# Patient Record
Sex: Female | Born: 1935
Health system: Southern US, Community
[De-identification: ages and names within clinical notes are randomized; demographics above are authoritative.]

## PROBLEM LIST (undated history)

## (undated) DIAGNOSIS — E7800 Pure hypercholesterolemia, unspecified: Secondary | ICD-10-CM

## (undated) DIAGNOSIS — K5790 Diverticulosis of intestine, part unspecified, without perforation or abscess without bleeding: Secondary | ICD-10-CM

## (undated) DIAGNOSIS — I10 Essential (primary) hypertension: Secondary | ICD-10-CM

## (undated) HISTORY — DX: Diverticulosis of intestine, part unspecified, without perforation or abscess without bleeding: K57.90

---
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
## 1984-10-21 HISTORY — PX: ABDOMINAL HYSTERECTOMY: SHX81

## 1998-06-14 ENCOUNTER — Ambulatory Visit (HOSPITAL_COMMUNITY): Admission: RE | Admit: 1998-06-14 | Discharge: 1998-06-14 | Payer: Self-pay | Admitting: Emergency Medicine

## 1999-06-21 ENCOUNTER — Ambulatory Visit (HOSPITAL_COMMUNITY): Admission: RE | Admit: 1999-06-21 | Discharge: 1999-06-21 | Payer: Self-pay | Admitting: *Deleted

## 2000-06-24 ENCOUNTER — Ambulatory Visit (HOSPITAL_COMMUNITY): Admission: RE | Admit: 2000-06-24 | Discharge: 2000-06-24 | Payer: Self-pay

## 2000-07-06 ENCOUNTER — Emergency Department (HOSPITAL_COMMUNITY): Admission: EM | Admit: 2000-07-06 | Discharge: 2000-07-06 | Payer: Self-pay | Admitting: Emergency Medicine

## 2001-06-26 ENCOUNTER — Encounter: Payer: Self-pay | Admitting: Family Medicine

## 2001-06-26 ENCOUNTER — Ambulatory Visit (HOSPITAL_COMMUNITY): Admission: RE | Admit: 2001-06-26 | Discharge: 2001-06-26 | Payer: Self-pay

## 2002-01-12 ENCOUNTER — Other Ambulatory Visit: Admission: RE | Admit: 2002-01-12 | Discharge: 2002-01-12 | Payer: Self-pay | Admitting: Nurse Practitioner

## 2002-01-29 ENCOUNTER — Encounter: Admission: RE | Admit: 2002-01-29 | Discharge: 2002-01-29 | Payer: Self-pay | Admitting: Family Medicine

## 2002-01-29 ENCOUNTER — Encounter: Payer: Self-pay | Admitting: Family Medicine

## 2002-02-06 ENCOUNTER — Encounter: Payer: Self-pay | Admitting: Family Medicine

## 2002-02-06 ENCOUNTER — Ambulatory Visit (HOSPITAL_COMMUNITY): Admission: RE | Admit: 2002-02-06 | Discharge: 2002-02-06 | Payer: Self-pay | Admitting: Family Medicine

## 2002-06-28 ENCOUNTER — Ambulatory Visit (HOSPITAL_COMMUNITY): Admission: RE | Admit: 2002-06-28 | Discharge: 2002-06-28 | Payer: Self-pay

## 2003-07-04 ENCOUNTER — Ambulatory Visit (HOSPITAL_COMMUNITY): Admission: RE | Admit: 2003-07-04 | Discharge: 2003-07-04 | Payer: Self-pay | Admitting: *Deleted

## 2005-01-07 ENCOUNTER — Emergency Department (HOSPITAL_COMMUNITY): Admission: EM | Admit: 2005-01-07 | Discharge: 2005-01-07 | Payer: Self-pay | Admitting: Emergency Medicine

## 2007-02-27 ENCOUNTER — Ambulatory Visit: Payer: Self-pay | Admitting: Gastroenterology

## 2007-03-13 ENCOUNTER — Encounter: Payer: Self-pay | Admitting: Gastroenterology

## 2007-03-13 ENCOUNTER — Ambulatory Visit: Payer: Self-pay | Admitting: Gastroenterology

## 2007-12-30 ENCOUNTER — Emergency Department (HOSPITAL_COMMUNITY): Admission: EM | Admit: 2007-12-30 | Discharge: 2007-12-30 | Payer: Self-pay | Admitting: Family Medicine

## 2009-03-02 ENCOUNTER — Encounter: Admission: RE | Admit: 2009-03-02 | Discharge: 2009-03-02 | Payer: Self-pay | Admitting: Family Medicine

## 2009-06-29 ENCOUNTER — Ambulatory Visit (HOSPITAL_COMMUNITY): Admission: RE | Admit: 2009-06-29 | Discharge: 2009-06-29 | Payer: Self-pay | Admitting: Ophthalmology

## 2010-10-30 ENCOUNTER — Encounter
Admission: RE | Admit: 2010-10-30 | Discharge: 2010-10-30 | Payer: Self-pay | Source: Home / Self Care | Attending: Family Medicine | Admitting: Family Medicine

## 2011-01-25 LAB — GLUCOSE, CAPILLARY: Glucose-Capillary: 113 mg/dL — ABNORMAL HIGH (ref 70–99)

## 2011-01-25 LAB — HEMOGLOBIN AND HEMATOCRIT, BLOOD: HCT: 34.8 % — ABNORMAL LOW (ref 36.0–46.0)

## 2011-01-25 LAB — BASIC METABOLIC PANEL
CO2: 28 mEq/L (ref 19–32)
Chloride: 108 mEq/L (ref 96–112)
GFR calc Af Amer: >60 mL/min (ref >60)
Potassium: 4.3 mEq/L (ref 3.5–5.1)
Sodium: 142 mEq/L (ref 135–145)

## 2011-09-23 ENCOUNTER — Other Ambulatory Visit: Payer: Self-pay | Admitting: Family Medicine

## 2011-09-23 DIAGNOSIS — Z1231 Encounter for screening mammogram for malignant neoplasm of breast: Secondary | ICD-10-CM

## 2011-11-01 ENCOUNTER — Ambulatory Visit (HOSPITAL_COMMUNITY)
Admission: RE | Admit: 2011-11-01 | Discharge: 2011-11-01 | Disposition: A | Discharge status: Home / Self Care | Payer: BC Managed Care – PPO | Source: Ambulatory Visit | Attending: Family Medicine | Admitting: Family Medicine

## 2011-11-01 DIAGNOSIS — Z1231 Encounter for screening mammogram for malignant neoplasm of breast: Secondary | ICD-10-CM

## 2012-01-14 ENCOUNTER — Encounter: Payer: Self-pay | Admitting: Gastroenterology

## 2012-01-16 ENCOUNTER — Encounter (HOSPITAL_COMMUNITY): Payer: Self-pay | Admitting: Emergency Medicine

## 2012-01-16 ENCOUNTER — Inpatient Hospital Stay (HOSPITAL_COMMUNITY)
Admission: EM | Admit: 2012-01-16 | Discharge: 2012-01-18 | DRG: 294 | Disposition: A | Discharge status: Home / Self Care | Payer: BC Managed Care – PPO | Source: Ambulatory Visit | Attending: Internal Medicine | Admitting: Internal Medicine

## 2012-01-16 DIAGNOSIS — L680 Hirsutism: Secondary | ICD-10-CM | POA: Diagnosis present

## 2012-01-16 DIAGNOSIS — E669 Obesity, unspecified: Secondary | ICD-10-CM | POA: Diagnosis present

## 2012-01-16 DIAGNOSIS — I10 Essential (primary) hypertension: Secondary | ICD-10-CM | POA: Diagnosis present

## 2012-01-16 DIAGNOSIS — E860 Dehydration: Secondary | ICD-10-CM | POA: Diagnosis present

## 2012-01-16 DIAGNOSIS — R739 Hyperglycemia, unspecified: Secondary | ICD-10-CM

## 2012-01-16 DIAGNOSIS — E119 Type 2 diabetes mellitus without complications: Principal | ICD-10-CM | POA: Diagnosis present

## 2012-01-16 DIAGNOSIS — Z794 Long term (current) use of insulin: Secondary | ICD-10-CM

## 2012-01-16 DIAGNOSIS — Z7982 Long term (current) use of aspirin: Secondary | ICD-10-CM

## 2012-01-16 DIAGNOSIS — E7800 Pure hypercholesterolemia, unspecified: Secondary | ICD-10-CM | POA: Diagnosis present

## 2012-01-16 DIAGNOSIS — E785 Hyperlipidemia, unspecified: Secondary | ICD-10-CM | POA: Diagnosis present

## 2012-01-16 DIAGNOSIS — Z79899 Other long term (current) drug therapy: Secondary | ICD-10-CM

## 2012-01-16 HISTORY — DX: Essential (primary) hypertension: I10

## 2012-01-16 HISTORY — DX: Pure hypercholesterolemia, unspecified: E78.00

## 2012-01-16 LAB — BASIC METABOLIC PANEL
GFR calc non Af Amer: 60 mL/min — ABNORMAL LOW (ref >90)
Glucose, Bld: 407 mg/dL — ABNORMAL HIGH (ref 70–99)
Potassium: 3.8 mEq/L (ref 3.5–5.1)
Sodium: 134 mEq/L — ABNORMAL LOW (ref 135–145)

## 2012-01-16 LAB — DIFFERENTIAL
Basophils Absolute: 0 10*3/uL (ref 0.0–0.1)
Eosinophils Absolute: 0.1 10*3/uL (ref 0.0–0.7)
Lymphocytes Relative: 42 % (ref 12–46)
Lymphs Abs: 3.1 10*3/uL (ref 0.7–4.0)
Neutrophils Relative %: 52 % (ref 43–77)

## 2012-01-16 LAB — CBC
MCH: 29.1 pg (ref 26.0–34.0)
Platelets: 237 10*3/uL (ref 150–400)
RBC: 4.4 MIL/uL (ref 3.87–5.11)
WBC: 7.4 10*3/uL (ref 4.0–10.5)

## 2012-01-16 LAB — GLUCOSE, CAPILLARY: Glucose-Capillary: 403 mg/dL — ABNORMAL HIGH (ref 70–99)

## 2012-01-16 MED ORDER — SODIUM CHLORIDE 0.9 % IV BOLUS (SEPSIS)
1000.0000 mL | Freq: Once | INTRAVENOUS | Status: AC
  Administered 2012-01-17: 1000 mL via INTRAVENOUS

## 2012-01-16 MED ORDER — SODIUM CHLORIDE 0.9 % IV SOLN: INTRAVENOUS | Status: DC

## 2012-01-16 NOTE — ED Notes (Signed)
PT. REPORTS ELEVATED BLOOD SUGAR THIS AFTERNOON = [phone] , STATES FEELING "THIRSTY , HUNGRY AND SLEEPY".

## 2012-01-17 ENCOUNTER — Encounter (HOSPITAL_COMMUNITY): Payer: Self-pay | Admitting: Internal Medicine

## 2012-01-17 ENCOUNTER — Emergency Department (HOSPITAL_COMMUNITY): Payer: BC Managed Care – PPO

## 2012-01-17 DIAGNOSIS — R739 Hyperglycemia, unspecified: Secondary | ICD-10-CM | POA: Diagnosis present

## 2012-01-17 DIAGNOSIS — E785 Hyperlipidemia, unspecified: Secondary | ICD-10-CM | POA: Diagnosis present

## 2012-01-17 DIAGNOSIS — E119 Type 2 diabetes mellitus without complications: Secondary | ICD-10-CM | POA: Diagnosis present

## 2012-01-17 DIAGNOSIS — I10 Essential (primary) hypertension: Secondary | ICD-10-CM | POA: Diagnosis present

## 2012-01-17 LAB — CBC
HCT: 32.9 % — ABNORMAL LOW (ref 36.0–46.0)
Hemoglobin: 11.3 g/dL — ABNORMAL LOW (ref 12.0–15.0)
RBC: 3.84 MIL/uL — ABNORMAL LOW (ref 3.87–5.11)
RDW: 13 % (ref 11.5–15.5)
WBC: 5.5 10*3/uL (ref 4.0–10.5)

## 2012-01-17 LAB — COMPREHENSIVE METABOLIC PANEL
AST: 14 U/L (ref 0–37)
Albumin: 3.8 g/dL (ref 3.5–5.2)
Chloride: 97 mEq/L (ref 96–112)
Creatinine, Ser: 0.85 mg/dL (ref 0.50–1.10)
Total Bilirubin: 1.2 mg/dL (ref 0.3–1.2)

## 2012-01-17 LAB — GLUCOSE, CAPILLARY
Glucose-Capillary: 143 mg/dL — ABNORMAL HIGH (ref 70–99)
Glucose-Capillary: 210 mg/dL — ABNORMAL HIGH (ref 70–99)
Glucose-Capillary: 232 mg/dL — ABNORMAL HIGH (ref 70–99)
Glucose-Capillary: 290 mg/dL — ABNORMAL HIGH (ref 70–99)
Glucose-Capillary: 363 mg/dL — ABNORMAL HIGH (ref 70–99)
Glucose-Capillary: 439 mg/dL — ABNORMAL HIGH (ref 70–99)

## 2012-01-17 LAB — URINALYSIS, ROUTINE W REFLEX MICROSCOPIC
Ketones, ur: 15 mg/dL — AB
Leukocytes, UA: NEGATIVE
Nitrite: NEGATIVE
Protein, ur: 30 mg/dL — AB
Urobilinogen, UA: 0.2 mg/dL (ref 0.0–1.0)
pH: 5 (ref 5.0–8.0)

## 2012-01-17 LAB — HEMOGLOBIN A1C
Hgb A1c MFr Bld: 9.8 % — ABNORMAL HIGH (ref <5.7)
Mean Plasma Glucose: 235 mg/dL — ABNORMAL HIGH (ref <117)
Mean Plasma Glucose: 243 mg/dL — ABNORMAL HIGH (ref <117)

## 2012-01-17 LAB — URINE MICROSCOPIC-ADD ON

## 2012-01-17 MED ORDER — ATORVASTATIN CALCIUM 40 MG PO TABS
40.0000 mg | ORAL_TABLET | Freq: Every day | ORAL | Status: DC
  Administered 2012-01-17: 40 mg via ORAL
  Filled 2012-01-17 (×2): qty 1

## 2012-01-17 MED ORDER — BENAZEPRIL HCL 40 MG PO TABS
40.0000 mg | ORAL_TABLET | Freq: Every day | ORAL | Status: DC
  Administered 2012-01-17 – 2012-01-18 (×2): 40 mg via ORAL
  Filled 2012-01-17 (×2): qty 1

## 2012-01-17 MED ORDER — AMLODIPINE BESYLATE 5 MG PO TABS
5.0000 mg | ORAL_TABLET | Freq: Every day | ORAL | Status: DC
  Administered 2012-01-17 – 2012-01-18 (×2): 5 mg via ORAL
  Filled 2012-01-17 (×2): qty 1

## 2012-01-17 MED ORDER — ONDANSETRON HCL 4 MG/2ML IJ SOLN: 4.0000 mg | Freq: Four times a day (QID) | INTRAMUSCULAR | Status: DC | PRN

## 2012-01-17 MED ORDER — DEXTROSE-NACL 5-0.45 % IV SOLN: INTRAVENOUS | Status: DC

## 2012-01-17 MED ORDER — INSULIN REGULAR BOLUS VIA INFUSION
0.0000 [IU] | Freq: Three times a day (TID) | INTRAVENOUS | Status: DC
  Administered 2012-01-17: 1 [IU] via INTRAVENOUS
  Filled 2012-01-17: qty 10

## 2012-01-17 MED ORDER — DEXTROSE 50 % IV SOLN: 25.0000 mL | INTRAVENOUS | Status: DC | PRN

## 2012-01-17 MED ORDER — ASPIRIN 325 MG PO TABS
325.0000 mg | ORAL_TABLET | Freq: Every day | ORAL | Status: DC
  Administered 2012-01-17 – 2012-01-18 (×2): 325 mg via ORAL
  Filled 2012-01-17 (×2): qty 1

## 2012-01-17 MED ORDER — ENOXAPARIN SODIUM 40 MG/0.4ML ~~LOC~~ SOLN
40.0000 mg | SUBCUTANEOUS | Status: DC
  Administered 2012-01-17 – 2012-01-18 (×2): 40 mg via SUBCUTANEOUS
  Filled 2012-01-17 (×2): qty 0.4

## 2012-01-17 MED ORDER — SODIUM CHLORIDE 0.9 % IV SOLN
INTRAVENOUS | Status: DC
  Filled 2012-01-17: qty 1

## 2012-01-17 MED ORDER — GLIPIZIDE 5 MG PO TABS
5.0000 mg | ORAL_TABLET | Freq: Every day | ORAL | Status: DC
  Administered 2012-01-17: 5 mg via ORAL
  Filled 2012-01-17 (×2): qty 1

## 2012-01-17 MED ORDER — PIOGLITAZONE HCL 45 MG PO TABS
45.0000 mg | ORAL_TABLET | Freq: Every day | ORAL | Status: DC
  Administered 2012-01-17: 45 mg via ORAL
  Filled 2012-01-17 (×2): qty 1

## 2012-01-17 MED ORDER — ALUM & MAG HYDROXIDE-SIMETH 200-200-20 MG/5ML PO SUSP: 30.0000 mL | Freq: Four times a day (QID) | ORAL | Status: DC | PRN

## 2012-01-17 MED ORDER — SODIUM CHLORIDE 0.9 % IV SOLN: INTRAVENOUS | Status: DC

## 2012-01-17 MED ORDER — INSULIN ASPART 100 UNIT/ML ~~LOC~~ SOLN
6.0000 [IU] | Freq: Three times a day (TID) | SUBCUTANEOUS | Status: DC
  Administered 2012-01-17 – 2012-01-18 (×5): 6 [IU] via SUBCUTANEOUS

## 2012-01-17 MED ORDER — INSULIN REGULAR HUMAN 100 UNIT/ML IJ SOLN
INTRAMUSCULAR | Status: DC
  Administered 2012-01-17: 03:00:00 via INTRAVENOUS
  Filled 2012-01-17: qty 1

## 2012-01-17 MED ORDER — IBUPROFEN 200 MG PO TABS
200.0000 mg | ORAL_TABLET | Freq: Four times a day (QID) | ORAL | Status: DC | PRN
  Filled 2012-01-17: qty 1

## 2012-01-17 MED ORDER — ALBUTEROL SULFATE HFA 108 (90 BASE) MCG/ACT IN AERS
2.0000 | INHALATION_SPRAY | Freq: Four times a day (QID) | RESPIRATORY_TRACT | Status: DC | PRN
  Filled 2012-01-17: qty 6.7

## 2012-01-17 MED ORDER — INSULIN GLARGINE 100 UNIT/ML ~~LOC~~ SOLN
18.0000 [IU] | Freq: Every day | SUBCUTANEOUS | Status: DC
  Administered 2012-01-17: 18 [IU] via SUBCUTANEOUS

## 2012-01-17 MED ORDER — INSULIN REGULAR BOLUS VIA INFUSION
0.0000 [IU] | Freq: Three times a day (TID) | INTRAVENOUS | Status: DC
  Filled 2012-01-17: qty 10

## 2012-01-17 MED ORDER — LIVING WELL WITH DIABETES BOOK
Freq: Once | Status: AC
  Administered 2012-01-17: 12:00:00
  Filled 2012-01-17: qty 1

## 2012-01-17 MED ORDER — ONDANSETRON HCL 4 MG PO TABS: 4.0000 mg | ORAL_TABLET | Freq: Four times a day (QID) | ORAL | Status: DC | PRN

## 2012-01-17 MED ORDER — INSULIN ASPART 100 UNIT/ML ~~LOC~~ SOLN
0.0000 [IU] | Freq: Three times a day (TID) | SUBCUTANEOUS | Status: DC
  Administered 2012-01-17: 3 [IU] via SUBCUTANEOUS
  Administered 2012-01-17: 5 [IU] via SUBCUTANEOUS
  Administered 2012-01-17: 3 [IU] via SUBCUTANEOUS
  Administered 2012-01-18 (×2): 8 [IU] via SUBCUTANEOUS

## 2012-01-17 MED ORDER — SENNOSIDES-DOCUSATE SODIUM 8.6-50 MG PO TABS
2.0000 | ORAL_TABLET | Freq: Every day | ORAL | Status: DC
  Administered 2012-01-17: 2 via ORAL
  Filled 2012-01-17: qty 1
  Filled 2012-01-17: qty 2

## 2012-01-17 MED ORDER — SODIUM CHLORIDE 0.9 % IJ SOLN
3.0000 mL | Freq: Two times a day (BID) | INTRAMUSCULAR | Status: DC
  Administered 2012-01-17 – 2012-01-18 (×3): 3 mL via INTRAVENOUS

## 2012-01-17 MED ORDER — SODIUM CHLORIDE 0.9 % IV SOLN
INTRAVENOUS | Status: DC
  Administered 2012-01-17: 03:00:00 via INTRAVENOUS

## 2012-01-17 NOTE — ED Provider Notes (Signed)
History     CSN: [id number]  Arrival date & time 01/16/12  1847   First MD Initiated Contact with Patient 01/16/12 2257      Chief Complaint  Patient presents with  . Hyperglycemia    (Consider location/radiation/quality/duration/timing/severity/associated sxs/prior treatment) The history is provided by the patient and a relative.   patient is a type II diabetic and has been taking her medications as prescribed. She admits to not checking her blood sugars regularly but today noticed her sugars in the 400s. She presented her doctor's office her blood sugar was almost 500. She has been having some generalized weakness and fatigue over the last 48 hours and some urinary frequency. She denies any dysuria, fevers, abdominal pain, chest pain, cough or any infectious symptoms otherwise. Her physicians offered her 2 options: Start insulin or go the hospital to be admitted. Patient opted to come to the hospital and was told by her physician that she would be directly admitted to the hospital on arrival.  Mild/moderate in severity. No known aggravating or alleviating factors. No change in diet.  Past Medical History  Diagnosis Date  . Hypertension   . Diabetes mellitus   . Hypercholesteremia     Past Surgical History  Procedure Date  . Abdominal hysterectomy     History reviewed. No pertinent family history.  History  Substance Use Topics  . Smoking status: Never Smoker   . Smokeless tobacco: Not on file  . Alcohol Use: No    OB History    Grav Para Term Preterm Abortions TAB SAB Ect Mult Living                  Review of Systems  Constitutional: Negative for fever and chills.  HENT: Negative for neck pain and neck stiffness.   Eyes: Negative for pain.  Respiratory: Negative for shortness of breath.   Cardiovascular: Negative for chest pain.  Gastrointestinal: Negative for abdominal pain.  Genitourinary: Positive for frequency. Negative for dysuria.  Musculoskeletal:  Negative for back pain.  Skin: Negative for rash.  Neurological: Negative for headaches.  All other systems reviewed and are negative.    Allergies  Codeine  Home Medications  No current outpatient prescriptions on file.  BP 136/81  Pulse 72  Temp(Src) 98.3 F (36.8 C) (Oral)  Resp 20  Ht 5' 5.2" (1.656 m)  Wt 200 lb 6.4 oz (90.9 kg)  BMI 33.14 kg/m2  SpO2 96%  Physical Exam  Constitutional: She is oriented to person, place, and time. She appears well-developed and well-nourished.  HENT:  Head: Normocephalic and atraumatic.       Mildly dry mucous membranes  Eyes: Conjunctivae and EOM are normal. Pupils are equal, round, and reactive to light.  Neck: Trachea normal. Neck supple. No thyromegaly present.  Cardiovascular: Normal rate, regular rhythm, S1 normal, S2 normal and normal pulses.     No systolic murmur is present   No diastolic murmur is present  Pulses:      Radial pulses are 2+ on the right side, and 2+ on the left side.  Pulmonary/Chest: Effort normal and breath sounds normal. She has no wheezes. She has no rhonchi. She has no rales. She exhibits no tenderness.  Abdominal: Soft. Normal appearance and bowel sounds are normal. There is no tenderness. There is no CVA tenderness and negative Murphy's sign.  Musculoskeletal:       BLE:s Calves nontender, no cords or erythema, negative Homans sign  Neurological: She is alert and  oriented to person, place, and time. She has normal strength. No cranial nerve deficit or sensory deficit. GCS eye subscore is 4. GCS verbal subscore is 5. GCS motor subscore is 6.  Skin: Skin is warm and dry. No rash noted. She is not diaphoretic.  Psychiatric: Her speech is normal.       Cooperative and appropriate    ED Course  Procedures (including critical care time)  Labs Reviewed  BASIC METABOLIC PANEL - Abnormal; Notable for the following:    Sodium 134 (*)    Chloride 93 (*)    Glucose, Bld 407 (*)    GFR calc non Af Amer 60  (*)    GFR calc Af Amer 69 (*)    All other components within normal limits  GLUCOSE, CAPILLARY - Abnormal; Notable for the following:    Glucose-Capillary 403 (*)    All other components within normal limits  URINALYSIS, ROUTINE W REFLEX MICROSCOPIC - Abnormal; Notable for the following:    Glucose, UA >1000 (*)    Hgb urine dipstick TRACE (*)    Ketones, ur 15 (*)    Protein, ur 30 (*)    All other components within normal limits  GLUCOSE, CAPILLARY - Abnormal; Notable for the following:    Glucose-Capillary 414 (*)    All other components within normal limits  GLUCOSE, CAPILLARY - Abnormal; Notable for the following:    Glucose-Capillary 363 (*)    All other components within normal limits  URINE MICROSCOPIC-ADD ON - Abnormal; Notable for the following:    Casts HYALINE CASTS (*)    All other components within normal limits  GLUCOSE, CAPILLARY - Abnormal; Notable for the following:    Glucose-Capillary 439 (*)    All other components within normal limits  CBC - Abnormal; Notable for the following:    RBC 3.84 (*)    Hemoglobin 11.3 (*)    HCT 32.9 (*)    All other components within normal limits  COMPREHENSIVE METABOLIC PANEL - Abnormal; Notable for the following:    Sodium 133 (*)    Glucose, Bld 519 (*)    GFR calc non Af Amer 66 (*)    GFR calc Af Amer 76 (*)    All other components within normal limits  GLUCOSE, CAPILLARY - Abnormal; Notable for the following:    Glucose-Capillary 457 (*)    All other components within normal limits  GLUCOSE, CAPILLARY - Abnormal; Notable for the following:    Glucose-Capillary 425 (*)    All other components within normal limits  GLUCOSE, CAPILLARY - Abnormal; Notable for the following:    Glucose-Capillary 290 (*)    All other components within normal limits  GLUCOSE, CAPILLARY - Abnormal; Notable for the following:    Glucose-Capillary 195 (*)    All other components within normal limits  GLUCOSE, CAPILLARY - Abnormal; Notable  for the following:    Glucose-Capillary 127 (*)    All other components within normal limits  GLUCOSE, CAPILLARY - Abnormal; Notable for the following:    Glucose-Capillary 143 (*)    All other components within normal limits  GLUCOSE, CAPILLARY - Abnormal; Notable for the following:    Glucose-Capillary 155 (*)    All other components within normal limits  CBC  DIFFERENTIAL  MAGNESIUM  HEMOGLOBIN A1C     1. Hyperglycemia       MDM   Hyperglycemia. Patient very upset that she was not directly admitted to the hospital as told by  her primary physician. Case discussed with [facility]ad hospitalist on-call who agrees to admit patient. IV fluids initiated. IV insulin initiated. Patient declines chest x-ray that she has no respiratory complaints. Urinalysis pending. Medical admission.        Sunnie Nielsen, MD 01/17/12 [id number]

## 2012-01-17 NOTE — H&P (Signed)
Amanda Riddle is an 77 y.o. female.   Chief Complaint: High Blood sugar HPI: 77 yo with known history of DM2 on oral medications who has not taken her medications in a while, was seen by her PCP and sent over to the ER for admission due to CBG of about 500. She has Polyuria and polyphagia. No evedence of DKA, no fever, no abdominal pain. Has some weakness and dehydration.  Past Medical History  Diagnosis Date  . Hypertension   . Diabetes mellitus   . Hypercholesteremia     Past Surgical History  Procedure Date  . Abdominal hysterectomy     History reviewed. No pertinent family history. Social History:  reports that she has never smoked. She does not have any smokeless tobacco history on file. She reports that she does not drink alcohol or use illicit drugs.  Allergies:  Allergies  Allergen Reactions  . Codeine     Medications Prior to Admission  Medication Dose Route Frequency Provider Last Rate Last Dose  . 0.9 %  sodium chloride infusion   Intravenous Continuous Rometta Emery, MD 100 mL/hr at 01/17/12 0617    . albuterol (PROVENTIL HFA;VENTOLIN HFA) 108 (90 BASE) MCG/ACT inhaler 2 puff  2 puff Inhalation Q6H PRN Rometta Emery, MD      . alum & mag hydroxide-simeth (MAALOX/MYLANTA) 200-200-20 MG/5ML suspension 30 mL  30 mL Oral Q6H PRN Rometta Emery, MD      . amLODipine (NORVASC) tablet 5 mg  5 mg Oral Daily Rometta Emery, MD      . aspirin tablet 325 mg  325 mg Oral Daily Rometta Emery, MD      . atorvastatin (LIPITOR) tablet 40 mg  40 mg Oral q1800 Rometta Emery, MD      . benazepril (LOTENSIN) tablet 40 mg  40 mg Oral Daily Rometta Emery, MD      . dextrose 5 %-0.45 % sodium chloride infusion   Intravenous Continuous Sunnie Nielsen, MD      . dextrose 50 % solution 25 mL  25 mL Intravenous PRN Sunnie Nielsen, MD      . dextrose 50 % solution 25 mL  25 mL Intravenous PRN Rometta Emery, MD      . enoxaparin (LOVENOX) injection 40 mg  40 mg Subcutaneous Q24H  Rometta Emery, MD      . glipiZIDE (GLUCOTROL) tablet 5 mg  5 mg Oral QAC breakfast Rometta Emery, MD      . ibuprofen (ADVIL,MOTRIN) tablet 200 mg  200 mg Oral Q6H PRN Rometta Emery, MD      . insulin regular (NOVOLIN R,HUMULIN R) 1 Units/mL in sodium chloride 0.9 % 100 mL infusion   Intravenous Continuous Rometta Emery, MD 2.7 mL/hr at 01/17/12 0607 2.7 Units/hr at 01/17/12 0607  . insulin regular bolus via infusion 0-10 Units  0-10 Units Intravenous TID WC Rometta Emery, MD      . ondansetron (ZOFRAN) tablet 4 mg  4 mg Oral Q6H PRN Rometta Emery, MD       Or  . ondansetron (ZOFRAN) injection 4 mg  4 mg Intravenous Q6H PRN Rometta Emery, MD      . pioglitazone (ACTOS) tablet 45 mg  45 mg Oral QAC breakfast Rometta Emery, MD      . sodium chloride 0.9 % bolus 1,000 mL  1,000 mL Intravenous Once Sunnie Nielsen, MD   1,000 mL at  01/17/12 0100  . sodium chloride 0.9 % injection 3 mL  3 mL Intravenous Q12H Rometta Emery, MD      . DISCONTD: 0.9 %  sodium chloride infusion   Intravenous Continuous Sunnie Nielsen, MD      . DISCONTD: 0.9 %  sodium chloride infusion   Intravenous Continuous Sunnie Nielsen, MD      . DISCONTD: insulin regular (NOVOLIN R,HUMULIN R) 1 Units/mL in sodium chloride 0.9 % 100 mL infusion   Intravenous Continuous Sunnie Nielsen, MD 4 mL/hr at 01/17/12 0300 4 Units/hr at 01/17/12 0300  . DISCONTD: insulin regular bolus via infusion 0-10 Units  0-10 Units Intravenous TID WC Sunnie Nielsen, MD       No current outpatient prescriptions on file as of 01/17/2012.    Results for orders placed during the hospital encounter of 01/16/12 (from the past 48 hour(s))  CBC     Status: Normal   Collection Time   01/16/12  7:22 PM      Component Value Range Comment   WBC 7.4  4.0 - 10.5 (K/uL)    RBC 4.40  3.87 - 5.11 (MIL/uL)    Hemoglobin 12.8  12.0 - 15.0 (g/dL)    HCT 40.9  81.1 - 91.4 (%)    MCV 84.1  78.0 - 100.0 (fL)    MCH 29.1  26.0 - 34.0 (pg)    MCHC 34.6  30.0 -  36.0 (g/dL)    RDW 78.2  95.6 - 21.3 (%)    Platelets 237  150 - 400 (K/uL)   DIFFERENTIAL     Status: Normal   Collection Time   01/16/12  7:22 PM      Component Value Range Comment   Neutrophils Relative 52  43 - 77 (%)    Neutro Abs 3.8  1.7 - 7.7 (K/uL)    Lymphocytes Relative 42  12 - 46 (%)    Lymphs Abs 3.1  0.7 - 4.0 (K/uL)    Monocytes Relative 6  3 - 12 (%)    Monocytes Absolute 0.4  0.1 - 1.0 (K/uL)    Eosinophils Relative 1  0 - 5 (%)    Eosinophils Absolute 0.1  0.0 - 0.7 (K/uL)    Basophils Relative 0  0 - 1 (%)    Basophils Absolute 0.0  0.0 - 0.1 (K/uL)   BASIC METABOLIC PANEL     Status: Abnormal   Collection Time   01/16/12  7:22 PM      Component Value Range Comment   Sodium 134 (*) 135 - 145 (mEq/L)    Potassium 3.8  3.5 - 5.1 (mEq/L)    Chloride 93 (*) 96 - 112 (mEq/L)    CO2 24  19 - 32 (mEq/L)    Glucose, Bld 407 (*) 70 - 99 (mg/dL)    BUN 21  6 - 23 (mg/dL)    Creatinine, Ser 0.86  0.50 - 1.10 (mg/dL)    Calcium 9.8  8.4 - 10.5 (mg/dL)    GFR calc non Af Amer 60 (*) >90 (mL/min)    GFR calc Af Amer 69 (*) >90 (mL/min)   GLUCOSE, CAPILLARY     Status: Abnormal   Collection Time   01/16/12  7:23 PM      Component Value Range Comment   Glucose-Capillary 403 (*) 70 - 99 (mg/dL)    Comment 1 [place], [place]     GLUCOSE, CAPILLARY     Status: Abnormal   Collection Time  01/16/12 11:29 PM      Component Value Range Comment   Glucose-Capillary 414 (*) 70 - 99 (mg/dL)   URINALYSIS, ROUTINE W REFLEX MICROSCOPIC     Status: Abnormal   Collection Time   01/17/12 12:37 AM      Component Value Range Comment   Color, Urine YELLOW  YELLOW     APPearance CLEAR  CLEAR     Specific Gravity, Urine 1.023  1.005 - 1.030     pH 5.0  5.0 - 8.0     Glucose, UA >1000 (*) NEGATIVE (mg/dL)    Hgb urine dipstick TRACE (*) NEGATIVE     Bilirubin Urine NEGATIVE  NEGATIVE     Ketones, ur 15 (*) NEGATIVE (mg/dL)    Protein, ur 30 (*) NEGATIVE (mg/dL)    Urobilinogen, UA 0.2   0.0 - 1.0 (mg/dL)    Nitrite NEGATIVE  NEGATIVE     Leukocytes, UA NEGATIVE  NEGATIVE    URINE MICROSCOPIC-ADD ON     Status: Abnormal   Collection Time   01/17/12 12:37 AM      Component Value Range Comment   Squamous Epithelial / LPF RARE  RARE     WBC, UA 0-2  <3 (WBC/hpf)    RBC / HPF 3-6  <3 (RBC/hpf)    Bacteria, UA RARE  RARE     Casts HYALINE CASTS (*) NEGATIVE     Urine-Other MUCOUS PRESENT     GLUCOSE, CAPILLARY     Status: Abnormal   Collection Time   01/17/12 12:48 AM      Component Value Range Comment   Glucose-Capillary 363 (*) 70 - 99 (mg/dL)   GLUCOSE, CAPILLARY     Status: Abnormal   Collection Time   01/17/12  1:44 AM      Component Value Range Comment   Glucose-Capillary 439 (*) 70 - 99 (mg/dL)   GLUCOSE, CAPILLARY     Status: Abnormal   Collection Time   01/17/12  2:47 AM      Component Value Range Comment   Glucose-Capillary 457 (*) 70 - 99 (mg/dL)    Comment 1 Documented in Chart      Comment 2 Notify RN     CBC     Status: Abnormal   Collection Time   01/17/12  3:00 AM      Component Value Range Comment   WBC 5.5  4.0 - 10.5 (K/uL)    RBC 3.84 (*) 3.87 - 5.11 (MIL/uL)    Hemoglobin 11.3 (*) 12.0 - 15.0 (g/dL)    HCT 04.5 (*) 40.9 - 46.0 (%)    MCV 85.7  78.0 - 100.0 (fL)    MCH 29.4  26.0 - 34.0 (pg)    MCHC 34.3  30.0 - 36.0 (g/dL)    RDW 81.1  91.4 - 78.2 (%)    Platelets 213  150 - 400 (K/uL)   MAGNESIUM     Status: Normal   Collection Time   01/17/12  3:00 AM      Component Value Range Comment   Magnesium 1.5  1.5 - 2.5 (mg/dL)   COMPREHENSIVE METABOLIC PANEL     Status: Abnormal   Collection Time   01/17/12  3:00 AM      Component Value Range Comment   Sodium 133 (*) 135 - 145 (mEq/L)    Potassium 3.8  3.5 - 5.1 (mEq/L)    Chloride 97  96 - 112 (mEq/L)    CO2 22  19 - 32 (mEq/L)    Glucose, Bld 519 (*) 70 - 99 (mg/dL)    BUN 19  6 - 23 (mg/dL)    Creatinine, Ser 1.61  0.50 - 1.10 (mg/dL)    Calcium 8.9  8.4 - 10.5 (mg/dL)    Total  Protein 6.8  6.0 - 8.3 (g/dL)    Albumin 3.8  3.5 - 5.2 (g/dL)    AST 14  0 - 37 (U/L)    ALT 14  0 - 35 (U/L)    Alkaline Phosphatase 87  39 - 117 (U/L)    Total Bilirubin 1.2  0.3 - 1.2 (mg/dL)    GFR calc non Af Amer 66 (*) >90 (mL/min)    GFR calc Af Amer 76 (*) >90 (mL/min)   GLUCOSE, CAPILLARY     Status: Abnormal   Collection Time   01/17/12  3:55 AM      Component Value Range Comment   Glucose-Capillary 425 (*) 70 - 99 (mg/dL)   GLUCOSE, CAPILLARY     Status: Abnormal   Collection Time   01/17/12  5:01 AM      Component Value Range Comment   Glucose-Capillary 290 (*) 70 - 99 (mg/dL)    Comment 1 Documented in Chart      Comment 2 Notify RN     GLUCOSE, CAPILLARY     Status: Abnormal   Collection Time   01/17/12  6:04 AM      Component Value Range Comment   Glucose-Capillary 195 (*) 70 - 99 (mg/dL)    No results found.  Review of Systems  Constitutional: Positive for diaphoresis.  Neurological: Positive for weakness.  Endo/Heme/Allergies: Positive for polydipsia.  All other systems reviewed and are negative.    Blood pressure 136/81, pulse 72, temperature 98.3 F (36.8 C), temperature source Oral, resp. rate 20, height 5' 5.2" (1.656 m), weight 90.9 kg (200 lb 6.4 oz), SpO2 96.00%. Physical Exam  Constitutional: She is oriented to person, place, and time. She appears well-developed and well-nourished.  HENT:  Head: Normocephalic and atraumatic.  Right Ear: External ear normal.  Left Ear: External ear normal.  Nose: Nose normal.  Mouth/Throat: Oropharynx is clear and moist.  Eyes: Conjunctivae and EOM are normal. Pupils are equal, round, and reactive to light.  Neck: Normal range of motion. Neck supple.  Cardiovascular: Normal rate, regular rhythm, normal heart sounds and intact distal pulses.   Respiratory: Effort normal and breath sounds normal.  GI: Soft. Bowel sounds are normal.  Musculoskeletal: Normal range of motion.  Neurological: She is alert and  oriented to person, place, and time. She has normal reflexes.  Skin: Skin is warm and dry.  Psychiatric: She has a normal mood and affect. Her behavior is normal. Judgment and thought content normal.     Assessment/Plan 1. Hyperglycemia: admit, hydrate, HBA1C, Glucose stablizer protocol and gradually transition to home medications. 2. HTN: Home medications 3. Hyperlipidemia: Check FLP and continue Statin 4. Dehydration: Hydrate  GARBA,LAWAL 01/17/2012, 6:45 AM

## 2012-01-17 NOTE — Progress Notes (Signed)
Utilization review complete 

## 2012-01-17 NOTE — Progress Notes (Signed)
10:15 - Pt taken off glucostabilizer and transitioned to carb modified diet per MD order.

## 2012-01-17 NOTE — Progress Notes (Signed)
   CARE MANAGEMENT NOTE 01/17/2012  Patient:  Amanda Riddle, Amanda Riddle   Account Number:  [id number]  Date Initiated:  01/17/2012  Documentation initiated by:  Donn Pierini  Subjective/Objective Assessment:   Pt admitted with hyperglycemia     Action/Plan:   PTA pt lived at home alone, was independent with ADLs   Anticipated DC Date:  01/18/2012   Anticipated DC Plan:  HOME/SELF CARE      DC Planning Services  CM consult      Choice offered to / List presented to:             Status of service:  In process, will continue to follow Medicare Important Message given?   (If response is "NO", the following Medicare IM given date fields will be blank) Date Medicare IM given:   Date Additional Medicare IM given:    Discharge Disposition:    Per UR Regulation:    If discussed at Long Length of Stay Meetings, dates discussed:    Comments:  PCP- [place]  01/17/12- 1524- Donn Pierini RN, BSN [phone] Spoke with pt at bedside- per conversation pt states that she lives at home with grandson- gets her medications via mail order or uses [facility]- has medication benefits- pt reports that she will have transportation home. Pt reports she still works and drives. No d/c needs anticipated. CM to follow

## 2012-01-17 NOTE — Progress Notes (Signed)
Patient ID: Amanda Riddle, female   DOB: Jan 15, 1935, 77 y.o.   MRN: [id number] Subjective: No complaints.  Hungry.  Reports her urine has had a strong odor.  Patient reports she was taking her diabetic medications at home.  Objective: Weight change:   Intake/Output Summary (Last 24 hours) at 01/17/12 0733 Last data filed at 01/17/12 0617  Gross per 24 hour  Intake   1330 ml  Output      0 ml  Net   1330 ml   Blood pressure 136/81, pulse 72, temperature 98.3 F (36.8 C), temperature source Oral, resp. rate 20, height 5' 5.2" (1.656 m), weight 90.9 kg (200 lb 6.4 oz), SpO2 96.00%. Filed Vitals:   01/17/12 0222 01/17/12 0242 01/17/12 0320 01/17/12 0530  BP: 144/62  134/72 136/81  Pulse: 88  76 72  Temp: 98.1 F (36.7 C)  98.6 F (37 C) 98.3 F (36.8 C)  TempSrc: Oral  Axillary Oral  Resp: 20   20  Height:  5' 5.2" (1.656 m)    Weight:  90.9 kg (200 lb 6.4 oz)    SpO2: 96%  97% 96%    Physical Exam: General: No acute distress, obese Head:  /AT, Hirshutism Lungs: Clear to auscultation bilaterally without wheezes or crackles Cardiovascular: Regular rate and rhythm without murmur gallop or rub normal S1 and S2 Abdomen: Nontender, nondistended, soft, bowel sounds positive, no rebound, no ascites, no appreciable mass Extremities: No significant cyanosis, clubbing, or edema bilateral lower extremities  Basic Metabolic Panel:  Lab 01/17/12 4098 01/16/12 1922  NA 133* 134*  K 3.8 3.8  CL 97 93*  CO2 22 24  GLUCOSE 519* 407*  BUN 19 21  CREATININE 0.85 0.92  CALCIUM 8.9 9.8  MG 1.5 --  PHOS -- --   Liver Function Tests:  Lab 01/17/12 0300  AST 14  ALT 14  ALKPHOS 87  BILITOT 1.2  PROT 6.8  ALBUMIN 3.8   CBC:  Lab 01/17/12 0300 01/16/12 1922  WBC 5.5 7.4  NEUTROABS -- 3.8  HGB 11.3* 12.8  HCT 32.9* 37.0  MCV 85.7 84.1  PLT 213 237   CBG:  Lab 01/17/12 1204 01/17/12 1010 01/17/12 0912 01/17/12 0810 01/17/12 0736 01/17/12 0706  GLUCAP 232* 210* 213* 155*  143* 127*    Studies/Results: Scheduled Meds:    . amLODipine  5 mg Oral Daily  . aspirin  325 mg Oral Daily  . atorvastatin  40 mg Oral q1800  . benazepril  40 mg Oral Daily  . enoxaparin  40 mg Subcutaneous Q24H  . insulin aspart  0-15 Units Subcutaneous TID WC  . insulin aspart  6 Units Subcutaneous TID WC  . insulin glargine  18 Units Subcutaneous Daily  . living well with diabetes book   Does not apply Once  . senna-docusate  2 tablet Oral QHS  . sodium chloride  1,000 mL Intravenous Once  . sodium chloride  3 mL Intravenous Q12H  . DISCONTD: glipiZIDE  5 mg Oral QAC breakfast  . DISCONTD: insulin regular  0-10 Units Intravenous TID WC  . DISCONTD: insulin regular  0-10 Units Intravenous TID WC  . DISCONTD: pioglitazone  45 mg Oral QAC breakfast   Continuous Infusions:   . sodium chloride 100 mL/hr at 01/17/12 0617  . dextrose 5 % and 0.45% NaCl    . insulin (NOVOLIN-R) infusion 2.7 Units/hr (01/17/12 1191)  . DISCONTD: sodium chloride    . DISCONTD: sodium chloride    .  DISCONTD: insulin (NOVOLIN-R) infusion 4 Units/hr (01/17/12 0300)   PRN Meds:.albuterol, alum & mag hydroxide-simeth, dextrose, dextrose, ibuprofen, ondansetron (ZOFRAN) IV, ondansetron  Anti-infectives:  Anti-infectives    None      Assessment/Plan: Principal Problem:  *Hyperglycemia Active Problems:  HTN (hypertension)  Hyperlipidemia  DM (diabetes mellitus)   1. Hyperglycemia: Etiology:  Progression of DM II? Possible UTI?  Check Hgb A1c, now off glucose stablizer, have d/c'd oral hyperglycemics and will use solely lantus and Novolog.  Patient will be discharged home on insulin.  Insulin teaching is ordered.  2. HTN: stable on home medications   3. Hyperlipidemia: Check FLP and continue Statin   4. Dehydration: resolved.   5.  DVT Prophylaxis:  Lovenox  6. Disposition:  To home.  PT / OT consult ordered.   LOS: 1 day    Stephani Police 01/17/2012, 7:33  AM [phone]  Attending -I have seen and examined the patient, I agree with the assessment and plan as outlined by Ms. York. I will await patient's HbA1c and begin insulin and diabetic education. Possible discharge in the next 24 hours her sugars continued to be well controlled.

## 2012-01-17 NOTE — Progress Notes (Signed)
Notified Dr. Mikeal Hawthorne that patient's CBG is now 195. Called to clarify if need to change IVF to D51/2NS at 50cc/hr. Dr. Mikeal Hawthorne stated not to change IVF to continue NS@100cc /hr.  Dr. Mikeal Hawthorne stated to call MD when CBG is less than 150.  Will continue to monitor patient. Nelda Marseille, RN

## 2012-01-17 NOTE — Evaluation (Signed)
Physical Therapy Evaluation Patient Details Name: Amanda Riddle MRN: [id number] DOB: April 24, 1935 Today's Date: 01/17/2012  Problem List:  Patient Active Problem List  Diagnoses  . Hyperglycemia  . HTN (hypertension)  . Hyperlipidemia  . DM (diabetes mellitus)    Past Medical History:  Past Medical History  Diagnosis Date  . Hypertension   . Diabetes mellitus   . Hypercholesteremia    Past Surgical History:  Past Surgical History  Procedure Date  . Abdominal hysterectomy     PT Assessment/Plan/Recommendation PT Assessment Clinical Impression Statement: Pt with slight LOB during dynamic gait, able to self correct. Pt reports that she feels she is at functional baseline for gait and mobility.  Pt with no further needs for acute care PT at this time. PT Recommendation/Assessment: Patent does not need any further PT services No Skilled PT: Patient at baseline level of functioning;Patient is modified independent with all activity/mobility PT Recommendation Follow Up Recommendations: No PT follow up;Supervision - Intermittent (supervision with stairs) PT Goals     PT Evaluation Precautions/Restrictions  Precautions Precautions: Fall Required Braces or Orthoses: No Restrictions Weight Bearing Restrictions: No Prior Functioning  Home Living Lives With: Alone Receives Help From: Family Type of Home: House Home Layout: One level Home Access: Stairs to enter Entrance Stairs-Rails: None Entrance Stairs-Number of Steps: 3 Prior Function Level of Independence: Independent with basic ADLs;Independent with homemaking with ambulation;Independent with gait Comments: working at [employer] Arousal/Alertness: Awake/alert Overall Cognitive Status: Appears within functional limits for tasks assessed Sensation/Coordination Sensation Light Touch: Appears Intact (discussed importance of foot care/checking due to diabetes) Proprioception: Appears Intact Extremity  Assessment RLE Assessment RLE Assessment: Within Functional Limits LLE Assessment LLE Assessment: Within Functional Limits Mobility (including Balance) Bed Mobility Supine to Sit: 7: Independent Transfers Sit to Stand: 6: Modified independent (Device/Increase time) Stand to Sit: 6: Modified independent (Device/Increase time) Stand Pivot Transfers: 6: Modified independent (Device/Increase time) Ambulation/Gait Ambulation/Gait: Yes Ambulation/Gait Assistance: 6: Modified independent (Device/Increase time) Ambulation Distance (Feet): 350 Feet Assistive device: None Gait Pattern:  (some increased sway) Gait velocity: mildly decreased Stairs: Yes Stairs Assistance: 5: Supervision Stairs Assistance Details (indicate cue type and reason): cues for safety to slow down to complete task Stair Management Technique: No rails Number of Stairs: 12   High Level Balance High Level Balance Activites: Direction changes;Turns;Sudden stops;Head turns High Level Balance Comments: needed supervision due to occasional slight LOB with dynamic gait, pt reports this is baseline for her and that "I can keep my balance" Exercise    End of Session PT - End of Session Equipment Utilized During Treatment: Gait belt Activity Tolerance: Patient tolerated treatment well Patient left: in chair;with call bell in reach Nurse Communication: Mobility status for transfers General Behavior During Session: [facility] for tasks performed Cognition: [facility] for tasks performed  [facility] 01/17/2012, 2:43 PM

## 2012-01-17 NOTE — Progress Notes (Signed)
01/17/12  Spoke with patient about her diabetes. Was diagnosed in 1996. PCP is Dr. Tanya Nones.  Has been on oral medications "off and on".  Does not check blood sugars at home.  Does not have a blood glucose meter.  Has been on insulin a few times in the past.  Attended DM outpatient classes when diagnosed.  Recommend that she watch DM videos #501-510 while here, order Living Well with Diabetes booklet, and may need BD insulin starter kit if being discharged on insulin.  Will continue to follow while in hospital.  Smith Mince RN BSN

## 2012-01-18 LAB — GLUCOSE, CAPILLARY
Glucose-Capillary: 327 mg/dL — ABNORMAL HIGH (ref 70–99)
Glucose-Capillary: 298 mg/dL — ABNORMAL HIGH (ref 70–99)
Glucose-Capillary: 279 mg/dL — ABNORMAL HIGH (ref 70–99)

## 2012-01-18 MED ORDER — METFORMIN HCL 500 MG PO TABS
500.0000 mg | ORAL_TABLET | Freq: Two times a day (BID) | ORAL | Status: DC
  Administered 2012-01-18: 500 mg via ORAL
  Filled 2012-01-18 (×3): qty 1

## 2012-01-18 MED ORDER — INSULIN GLARGINE 100 UNIT/ML ~~LOC~~ SOLN: 26.0000 [IU] | Freq: Every day | SUBCUTANEOUS | Status: DC

## 2012-01-18 MED ORDER — INSULIN ASPART 100 UNIT/ML ~~LOC~~ SOLN: 8.0000 [IU] | Freq: Three times a day (TID) | SUBCUTANEOUS | Status: DC

## 2012-01-18 MED ORDER — INSULIN GLARGINE 100 UNIT/ML ~~LOC~~ SOLN
24.0000 [IU] | Freq: Every day | SUBCUTANEOUS | Status: DC
  Administered 2012-01-18: 24 [IU] via SUBCUTANEOUS

## 2012-01-18 NOTE — Progress Notes (Signed)
Patient to be discharged home with family. IV site removed needle intact. Discharge instructions reviewed with patient and family. Valeda Malm

## 2012-01-18 NOTE — Progress Notes (Signed)
Patient set-up to watch all Diabetic videos while in her room. Valeda Malm

## 2012-01-18 NOTE — Discharge Summary (Signed)
PATIENT DETAILS Name: Amanda Riddle Age: 77 y.o. Sex: female Date of Birth: 1934/11/11 MRN: [id number]. Admit Date: 01/16/2012 Admitting Physician: Rometta Emery, MD FAO:[id number],QIONGE TOM, MD, MD  PRIMARY DISCHARGE DIAGNOSIS:  Principal Problem:  *Hyperglycemia Active Problems:  HTN (hypertension)  Hyperlipidemia  DM (diabetes mellitus)      PAST MEDICAL HISTORY: Past Medical History  Diagnosis Date  . Hypertension   . Diabetes mellitus   . Hypercholesteremia     DISCHARGE MEDICATIONS: Medication List  As of 01/18/2012  3:17 PM   STOP taking these medications         glipiZIDE 5 MG tablet         TAKE these medications         albuterol 108 (90 BASE) MCG/ACT inhaler   Commonly known as: PROVENTIL HFA;VENTOLIN HFA   Inhale 2 puffs into the lungs every 6 (six) hours as needed. Shortness of breath      amLODipine 5 MG tablet   Commonly known as: NORVASC   Take 5 mg by mouth daily.      aspirin 325 MG tablet   Take 325 mg by mouth daily.      atorvastatin 40 MG tablet   Commonly known as: LIPITOR   Take 40 mg by mouth daily.      benazepril 40 MG tablet   Commonly known as: LOTENSIN   Take 40 mg by mouth daily.      hydrochlorothiazide 25 MG tablet   Commonly known as: HYDRODIURIL   Take 25 mg by mouth daily.      ibuprofen 200 MG tablet   Commonly known as: ADVIL,MOTRIN   Take 200 mg by mouth every 6 (six) hours as needed. For pain      insulin aspart 100 UNIT/ML injection   Commonly known as: novoLOG   Inject 8 Units into the skin 3 (three) times daily with meals.      insulin glargine 100 UNIT/ML injection   Commonly known as: LANTUS   Inject 26 Units into the skin daily.      metFORMIN 500 MG tablet   Commonly known as: GLUCOPHAGE   Take 1,000 mg by mouth 2 (two) times daily with a meal.      mulitivitamin with minerals Tabs   Take 1 tablet by mouth daily.      pioglitazone 45 MG tablet   Commonly known as: ACTOS   Take 45 mg by mouth  daily.             BRIEF HPI:  See H&P, Labs, Consult and Test reports for all details in brief, patient was admitted for CBGs off more than 500. She was also complaining of polyuria and polyphagia. She was then admitted to the hospitalist service for further evaluation and treatment.  CONSULTATIONS:   None  PERTINENT RADIOLOGIC STUDIES: No results found.   PERTINENT LAB RESULTS: CBC:  Basename 01/17/12 0300 01/16/12 1922  WBC 5.5 7.4  HGB 11.3* 12.8  HCT 32.9* 37.0  PLT 213 237   CMET CMP     Component Value Date/Time   NA 133* 01/17/2012 0300   K 3.8 01/17/2012 0300   CL 97 01/17/2012 0300   CO2 22 01/17/2012 0300   GLUCOSE 519* 01/17/2012 0300   BUN 19 01/17/2012 0300   CREATININE 0.85 01/17/2012 0300   CALCIUM 8.9 01/17/2012 0300   PROT 6.8 01/17/2012 0300   ALBUMIN 3.8 01/17/2012 0300   AST 14 01/17/2012 0300   ALT  14 01/17/2012 0300   ALKPHOS 87 01/17/2012 0300   BILITOT 1.2 01/17/2012 0300   GFRNONAA 66* 01/17/2012 0300   GFRAA 76* 01/17/2012 0300    GFR Estimated Creatinine Clearance: 65 ml/min (by C-G formula based on Cr of 0.85). No results found for this basename: LIPASE:2,AMYLASE:2 in the last 72 hours No results found for this basename: CKTOTAL:3,CKMB:3,CKMBINDEX:3,TROPONINI:3 in the last 72 hours No components found with this basename: POCBNP:3 No results found for this basename: DDIMER:2 in the last 72 hours  Basename 01/17/12 1145 01/17/12 0300  HGBA1C 10.1* 9.8*   No results found for this basename: CHOL:2,HDL:2,LDLCALC:2,TRIG:2,CHOLHDL:2,LDLDIRECT:2 in the last 72 hours No results found for this basename: TSH,T4TOTAL,FREET3,T3FREE,THYROIDAB in the last 72 hours No results found for this basename: VITAMINB12:2,FOLATE:2,FERRITIN:2,TIBC:2,IRON:2,RETICCTPCT:2 in the last 72 hours Coags: No results found for this basename: PT:2,INR:2 in the last 72 hours Microbiology: No results found for this or any previous visit (from the past 240 hour(s)).   BRIEF  HOSPITAL COURSE:   Principal Problem:  *Hyperglycemia Patient was admitted to the hospital for uncontrolled hyperglycemia. She was complaining of polyuria and polydipsia. She presented to her PCPs office for sugars 500 and was sent over to the ED, she had no evidence of either the ketoacidosis. In any event she was placed on an insulin infusion and admitted to the hospital. She was also hydrated. She was then transitioned to subcutaneous insulin in the form of Lantus and NovoLog. Her Lantus dose has slowly been increased, discharge dosing will be 26 units of Lantus daily along with 8 units of NovoLog to be taken with meals. Her blood sugars are significantly better and are ranging between 200-300. She is also now asymptomatic. She will follow up with her primary care practitioner for further continued optimization of her insulin regimen. Her hemoglobin A1c is 10.1. She will also continue on metformin and Actos as previous. Patient has had extensive diabetic education, the RN has shown how to inject insulin. In fact patient has had insulin the past and claims to be proficient enough to inject insulin herself.   HTN (hypertension) -Patient will continue all her prior antihypertensive medications.   Hyperlipidemia -Continue her statins.  TODAY-DAY OF DISCHARGE:  Subjective:   [name] today has no headache,no chest abdominal pain,no new weakness tingling or numbness, feels much better wants to go home today.   Objective:   Blood pressure 96/55, pulse 73, temperature 97.6 F (36.4 C), temperature source Oral, resp. rate 16, height 5' 5.2" (1.656 m), weight 90.9 kg (200 lb 6.4 oz), SpO2 97.00%.  Intake/Output Summary (Last 24 hours) at 01/18/12 1517 Last data filed at 01/17/12 1841  Gross per 24 hour  Intake    360 ml  Output    300 ml  Net     60 ml    Exam Awake Alert, Oriented *3, No new F.N deficits, Normal affect [place].AT,PERRAL Supple Neck,No JVD, No cervical lymphadenopathy  appriciated.  Symmetrical Chest wall movement, Good air movement bilaterally, CTAB RRR,No Gallops,Rubs or new Murmurs, No Parasternal Heave +ve B.Sounds, Abd Soft, Non tender, No organomegaly appriciated, No rebound -guarding or rigidity. No Cyanosis, Clubbing or edema, No new Rash or bruise  DISPOSITION: Home  DISCHARGE INSTRUCTIONS:    Follow-up Information    Follow up with [facility] TOM, MD. Schedule an appointment as soon as possible for a visit in 1 week.          Total Time spent on discharge equals 45 minutes.  SignedJeoffrey Massed 01/18/2012 3:17  PM

## 2012-01-19 LAB — URINE CULTURE

## 2012-03-28 ENCOUNTER — Other Ambulatory Visit: Payer: Self-pay | Admitting: Internal Medicine

## 2012-10-08 ENCOUNTER — Other Ambulatory Visit: Payer: Self-pay | Admitting: Family Medicine

## 2012-10-08 DIAGNOSIS — Z1231 Encounter for screening mammogram for malignant neoplasm of breast: Secondary | ICD-10-CM

## 2012-11-02 ENCOUNTER — Ambulatory Visit (HOSPITAL_COMMUNITY)
Admission: RE | Admit: 2012-11-02 | Discharge: 2012-11-02 | Disposition: A | Discharge status: Home / Self Care | Payer: BC Managed Care – PPO | Source: Ambulatory Visit | Attending: Family Medicine | Admitting: Family Medicine

## 2012-11-02 DIAGNOSIS — Z1231 Encounter for screening mammogram for malignant neoplasm of breast: Secondary | ICD-10-CM | POA: Insufficient documentation

## 2013-01-15 ENCOUNTER — Other Ambulatory Visit (INDEPENDENT_AMBULATORY_CARE_PROVIDER_SITE_OTHER): Payer: Self-pay

## 2013-01-15 ENCOUNTER — Other Ambulatory Visit: Payer: Self-pay | Admitting: Family Medicine

## 2013-01-15 DIAGNOSIS — E119 Type 2 diabetes mellitus without complications: Secondary | ICD-10-CM

## 2013-01-15 DIAGNOSIS — E785 Hyperlipidemia, unspecified: Secondary | ICD-10-CM

## 2013-01-15 LAB — LIPID PANEL: Cholesterol: 182 mg/dL (ref 0–200)

## 2013-01-15 LAB — COMPLETE METABOLIC PANEL WITH GFR
Albumin: 4.1 g/dL (ref 3.5–5.2)
BUN: 16 mg/dL (ref 6–23)
Calcium: 9.6 mg/dL (ref 8.4–10.5)
Chloride: 105 mEq/L (ref 96–112)
GFR, Est Non African American: 62 mL/min
Glucose, Bld: 103 mg/dL — ABNORMAL HIGH (ref 70–99)
Potassium: 4.4 mEq/L (ref 3.5–5.3)
Total Protein: 6.8 g/dL (ref 6.0–8.3)

## 2013-01-15 LAB — HEMOGLOBIN A1C: Hgb A1c MFr Bld: 6.5 % — ABNORMAL HIGH (ref <5.7)

## 2013-01-26 ENCOUNTER — Encounter: Payer: Self-pay | Admitting: Gastroenterology

## 2013-01-29 ENCOUNTER — Other Ambulatory Visit: Payer: Self-pay | Admitting: Family Medicine

## 2013-01-29 NOTE — Telephone Encounter (Signed)
Medication refilled per protocol.  Pt has appt 02/05/16

## 2013-02-04 ENCOUNTER — Encounter: Payer: Self-pay | Admitting: Family Medicine

## 2013-02-04 ENCOUNTER — Ambulatory Visit (INDEPENDENT_AMBULATORY_CARE_PROVIDER_SITE_OTHER): Payer: BC Managed Care – PPO | Admitting: Family Medicine

## 2013-02-04 VITALS — BP 120/68 | HR 80 | Temp 97.4°F | Resp 14 | Wt 202.0 lb

## 2013-02-04 DIAGNOSIS — E119 Type 2 diabetes mellitus without complications: Secondary | ICD-10-CM

## 2013-02-04 DIAGNOSIS — R918 Other nonspecific abnormal finding of lung field: Secondary | ICD-10-CM

## 2013-02-04 DIAGNOSIS — E785 Hyperlipidemia, unspecified: Secondary | ICD-10-CM

## 2013-02-04 DIAGNOSIS — I10 Essential (primary) hypertension: Secondary | ICD-10-CM

## 2013-02-04 MED ORDER — LEVOCETIRIZINE DIHYDROCHLORIDE 5 MG PO TABS: 5.0000 mg | ORAL_TABLET | Freq: Every evening | ORAL | Status: DC

## 2013-02-04 MED ORDER — ALBUTEROL SULFATE HFA 108 (90 BASE) MCG/ACT IN AERS: 2.0000 | INHALATION_SPRAY | Freq: Four times a day (QID) | RESPIRATORY_TRACT | Status: DC | PRN

## 2013-02-04 NOTE — Progress Notes (Signed)
Subjective:    Patient ID: Amanda Riddle, female    DOB: 07-08-1935, 78 y.o.   MRN: [id number]  HPI  Patient is here for followup of her diabetes. She is currently taking Lantus 35 units subcutaneous daily. She is taking NovoLog 8 units with breakfast. However her breakfast usually consists of a milkshake. She denies any mid morning hypoglycemia. She is also taking metformin thousand milligrams by mouth twice a day. Her hemoglobin A1c was recently found to be 6.5 and well controlled..  #2 hyperlipidemia. She's currently taking Lipitor 40 mg by mouth daily. She denies any myalgias or right upper quadrant pain. Be less than 100. Ratios B. greater than 60. Her triglycerides were found the less than 150. Her cholesterol is well controlled.  #3 hypertension. She's currently taking Norvasc 5 mg by mouth daily, chlorothiazide 25 mg by mouth daily.  She denies chest pain or shortness of breath.  She does report some nighttime wheezing over the last 7 weeks. Should like a refill on her albuterol she's also having occasional cough on a daily basis for the last few weeks. She denies any fever, shortness of breath, chest pain, hemoptysis. She does have a history of passive secondhand exposure to smoke.  Past Medical History  Diagnosis Date  . Hypertension   . Diabetes mellitus   . Hypercholesteremia    Current Outpatient Prescriptions on File Prior to Visit  Medication Sig Dispense Refill  . amLODipine (NORVASC) 5 MG tablet Take 5 mg by mouth daily.      Marland Kitchen aspirin 325 MG tablet Take 325 mg by mouth daily.      Marland Kitchen atorvastatin (LIPITOR) 40 MG tablet Take 40 mg by mouth daily.      . hydrochlorothiazide (HYDRODIURIL) 25 MG tablet Take 25 mg by mouth daily.      Marland Kitchen ibuprofen (ADVIL,MOTRIN) 200 MG tablet Take 200 mg by mouth every 6 (six) hours as needed. For pain      . metFORMIN (GLUCOPHAGE) 1000 MG tablet TAKE 1 TABLET TWICE A DAY  180 tablet  1   No current facility-administered medications on file  prior to visit.   History   Social History  . Marital Status: Divorced    Spouse Name: N/A    Number of Children: N/A  . Years of Education: N/A   Occupational History  . Not on file.   Social History Main Topics  . Smoking status: Never Smoker   . Smokeless tobacco: Not on file  . Alcohol Use: No  . Drug Use: No  . Sexually Active:    Other Topics Concern  . Not on file   Social History Narrative  . No narrative on file     Review of Systems  All other systems reviewed and are negative.       Objective:   Physical Exam  Constitutional: She appears well-developed and well-nourished.  HENT:  Head: Normocephalic.  Right Ear: External ear normal.  Left Ear: External ear normal.  Mouth/Throat: Oropharynx is clear and moist. No oropharyngeal exudate.  Eyes: Conjunctivae are normal. Pupils are equal, round, and reactive to light.  Neck: Normal range of motion. Neck supple. No JVD present. No thyromegaly present.  Cardiovascular: Normal rate, regular rhythm, normal heart sounds and intact distal pulses.   No murmur heard. Pulmonary/Chest: Effort normal. No respiratory distress. She has no wheezes. She has rales (left basilar crackles.). She exhibits no tenderness.  Abdominal: Soft. Bowel sounds are normal. She exhibits no distension. There  is no tenderness. There is no rebound and no guarding.  Lymphadenopathy:    She has no cervical adenopathy.          Assessment & Plan:  1. Lung field abnormal finding on examination I did refill her albuterol. However when you get a chest x-ray based on her abnormal lung exam. She has no signs or symptoms of pneumonia. There is no peripheral edema her exam today. There is no evidence of congestive heart failure. Result of a chest x-ray before deciding upon therapy. - DG Chest 2 View; Future  2. DM (diabetes mellitus) Diabetes is well controlled. In fact am worried about midmorning hypoglycemia due to the lack of a substantial  breakfast. I recommend she cut her NovoLog 4 units with breakfast. If her sugars do not show poor control in 2 weeks we will discontinue the NovoLog altogether  3. HLD (hyperlipidemia) Excellent control continue Lipitor 40 mg by mouth daily  4. HTN (hypertension) Blood pressure is well controlled. In the future we can consider adding an ACE inhibitor or an ARB to provide renal protection.  Await the results of the chest x-ray.

## 2013-02-05 ENCOUNTER — Ambulatory Visit
Admission: RE | Admit: 2013-02-05 | Discharge: 2013-02-05 | Disposition: A | Discharge status: Home / Self Care | Payer: Medicare PPO | Source: Ambulatory Visit | Attending: Family Medicine | Admitting: Family Medicine

## 2013-02-05 DIAGNOSIS — R918 Other nonspecific abnormal finding of lung field: Secondary | ICD-10-CM

## 2013-02-08 ENCOUNTER — Other Ambulatory Visit: Payer: Self-pay | Admitting: Family Medicine

## 2013-02-08 MED ORDER — AZITHROMYCIN 250 MG PO TABS: 250.0000 mg | ORAL_TABLET | Freq: Every day | ORAL | Status: DC

## 2013-02-08 NOTE — Telephone Encounter (Signed)
Pt script called in and pt does still have some couighing

## 2013-02-12 ENCOUNTER — Telehealth: Payer: Self-pay | Admitting: Family Medicine

## 2013-02-12 MED ORDER — INSULIN GLARGINE 100 UNITS/ML SOLOSTAR PEN: 35.0000 [IU] | PEN_INJECTOR | Freq: Every day | SUBCUTANEOUS | Status: DC

## 2013-02-12 NOTE — Telephone Encounter (Signed)
Rx Refilled  

## 2013-02-15 ENCOUNTER — Encounter: Payer: Self-pay | Admitting: Gastroenterology

## 2013-02-24 ENCOUNTER — Other Ambulatory Visit: Payer: Self-pay | Admitting: Family Medicine

## 2013-03-05 ENCOUNTER — Encounter: Payer: Self-pay | Admitting: Family Medicine

## 2013-03-05 ENCOUNTER — Ambulatory Visit (INDEPENDENT_AMBULATORY_CARE_PROVIDER_SITE_OTHER): Payer: BC Managed Care – PPO | Admitting: Family Medicine

## 2013-03-05 VITALS — BP 120/70 | HR 72 | Temp 98.1°F | Resp 14 | Wt 200.0 lb

## 2013-03-05 DIAGNOSIS — K120 Recurrent oral aphthae: Secondary | ICD-10-CM

## 2013-03-05 MED ORDER — MAGIC MOUTHWASH W/LIDOCAINE: 5.0000 mL | Freq: Four times a day (QID) | ORAL | Status: DC | PRN

## 2013-03-05 NOTE — Progress Notes (Signed)
  Subjective:    Patient ID: Amanda Riddle, female    DOB: 1935-01-14, 78 y.o.   MRN: [id number]  HPI Patient is here today for a painful lesion on the lateral aspect of her tongue. It is on the right side. Has been there since Wednesday. It is very sore to the touch. It hurts to eat and chew. She has a history of tobacco use/dip.  She denies any fevers. She denies any history of nonhealing ulcers in the mouth. She is sure this has only been there 2 days. Past Medical History  Diagnosis Date  . Hypertension   . Diabetes mellitus   . Hypercholesteremia    Current Outpatient Prescriptions on File Prior to Visit  Medication Sig Dispense Refill  . albuterol (PROVENTIL HFA;VENTOLIN HFA) 108 (90 BASE) MCG/ACT inhaler Inhale 2 puffs into the lungs every 6 (six) hours as needed. Shortness of breath  1 Inhaler  1  . amLODipine (NORVASC) 5 MG tablet Take 5 mg by mouth daily.      Marland Kitchen aspirin 325 MG tablet Take 325 mg by mouth daily.      Marland Kitchen atorvastatin (LIPITOR) 40 MG tablet Take 40 mg by mouth daily.      . cholecalciferol (VITAMIN D) 1000 UNITS tablet Take 1,000 Units by mouth daily.      Marland Kitchen glucosamine-chondroitin 500-400 MG tablet Take 1 tablet by mouth 3 (three) times daily.      . hydrochlorothiazide (HYDRODIURIL) 25 MG tablet Take 25 mg by mouth daily.      . hydrochlorothiazide (MICROZIDE) 12.5 MG capsule TAKE 1 TABLET BY MOUTH DAILY  30 capsule  2  . ibuprofen (ADVIL,MOTRIN) 200 MG tablet Take 200 mg by mouth every 6 (six) hours as needed. For pain      . insulin aspart (NOVOLOG) 100 UNIT/ML injection Inject 4 Units into the skin 3 (three) times daily with meals.       . insulin glargine (LANTUS) 100 units/mL SOLN Inject 35 Units into the skin at bedtime.  3 mL  5  . metFORMIN (GLUCOPHAGE) 1000 MG tablet TAKE 1 TABLET TWICE A DAY  180 tablet  1   No current facility-administered medications on file prior to visit.   Allergies  Allergen Reactions  . Codeine       Review of Systems  All  other systems reviewed and are negative.       Objective:   Physical Exam  Vitals reviewed. HENT:  Mouth/Throat: Oropharynx is clear and moist. No oropharyngeal exudate.  Eyes: Conjunctivae are normal. Pupils are equal, round, and reactive to light.  Cardiovascular: Normal rate and regular rhythm.   Pulmonary/Chest: Effort normal and breath sounds normal.  Lymphadenopathy:    She has no cervical adenopathy.  2 mm to 3 mm shallow ulcer on the right lateral aspect of the tongue        Assessment & Plan:  1. Canker sore I explained I felt that this is most likely a canker sore. Will treat with Magic mouthwash 1 teaspoon 4 times a day for 7 days. If the lesion is not healing I would keep in mind cancers in the mouth given her tobacco use. She understands she will need to come back to have that biopsied if it persists. - Alum & Mag Hydroxide-Simeth (MAGIC MOUTHWASH W/LIDOCAINE) SOLN; Take 5 mLs by mouth 4 (four) times daily as needed.  Dispense: 200 mL; Refill: 0

## 2013-03-08 ENCOUNTER — Other Ambulatory Visit: Payer: Self-pay | Admitting: Family Medicine

## 2013-03-08 NOTE — Telephone Encounter (Signed)
diab supplies refilled 

## 2013-03-16 ENCOUNTER — Ambulatory Visit (AMBULATORY_SURGERY_CENTER): Payer: Medicare PPO | Admitting: *Deleted

## 2013-03-16 ENCOUNTER — Encounter: Payer: Self-pay | Admitting: Gastroenterology

## 2013-03-16 VITALS — Ht 65.5 in | Wt 198.0 lb

## 2013-03-16 DIAGNOSIS — Z1211 Encounter for screening for malignant neoplasm of colon: Secondary | ICD-10-CM

## 2013-03-16 MED ORDER — NA SULFATE-K SULFATE-MG SULF 17.5-3.13-1.6 GM/177ML PO SOLN: ORAL | Status: DC

## 2013-03-30 ENCOUNTER — Encounter: Payer: Self-pay | Admitting: Gastroenterology

## 2013-03-30 ENCOUNTER — Ambulatory Visit (AMBULATORY_SURGERY_CENTER): Payer: Medicare PPO | Admitting: Gastroenterology

## 2013-03-30 VITALS — BP 120/52 | HR 68 | Temp 97.9°F | Resp 16 | Ht 65.0 in | Wt 198.0 lb

## 2013-03-30 DIAGNOSIS — Z1211 Encounter for screening for malignant neoplasm of colon: Secondary | ICD-10-CM

## 2013-03-30 DIAGNOSIS — D126 Benign neoplasm of colon, unspecified: Secondary | ICD-10-CM

## 2013-03-30 DIAGNOSIS — K5730 Diverticulosis of large intestine without perforation or abscess without bleeding: Secondary | ICD-10-CM

## 2013-03-30 LAB — GLUCOSE, CAPILLARY: Glucose-Capillary: 97 mg/dL (ref 70–99)

## 2013-03-30 MED ORDER — SODIUM CHLORIDE 0.9 % IV SOLN: 500.0000 mL | INTRAVENOUS | Status: DC

## 2013-03-30 NOTE — Progress Notes (Signed)
Patient did not experience any of the following events: a burn prior to discharge; a fall within the facility; wrong site/side/patient/procedure/implant event; or a hospital transfer or hospital admission upon discharge from the facility. (G8907) Patient did not have preoperative order for IV antibiotic SSI prophylaxis. (G8918)  

## 2013-03-30 NOTE — Patient Instructions (Addendum)

## 2013-03-30 NOTE — Progress Notes (Signed)
Called to room to assist during endoscopic procedure.  Patient ID and intended procedure confirmed with present staff. Received instructions for my participation in the procedure from the performing physician.  

## 2013-03-30 NOTE — Op Note (Signed)
[facility] [address] [place], [address]   COLONOSCOPY PROCEDURE REPORT  PATIENT: Amanda Riddle, Amanda Riddle  MR#: [id number] BIRTHDATE: 1935/07/05 , 77  yrs. old GENDER: Female ENDOSCOPIST: Louis Meckel, MD REFERRED XB:[id number] Tanya Nones, M.D. PROCEDURE DATE:  03/30/2013 PROCEDURE:   Colonoscopy with snare polypectomy ASA CLASS:   Class II INDICATIONS:Patient's personal history of colon polyps.  adenomatous polyps 2003 and 2008 MEDICATIONS: MAC sedation, administered by CRNA and propofol (Diprivan) 300mg  IV  DESCRIPTION OF PROCEDURE:   After the risks benefits and alternatives of the procedure were thoroughly explained, informed consent was obtained.  A digital rectal exam revealed no abnormalities of the rectum.   The [id number]  endoscope was introduced through the anus and advanced to the cecum, which was identified by both the appendix and ileocecal valve. No adverse events experienced.   The quality of the prep was excellent using Suprep  The instrument was then slowly withdrawn as the colon was fully examined.      COLON FINDINGS: A sessile polyp was found in the transverse colon. A polypectomy was performed with a cold snare.  The resection was complete and the polyp tissue was completely retrieved.   Moderate diverticulosis was noted in the sigmoid colon.   Mild diverticulosis was noted in the ascending colon.   The colon mucosa was otherwise normal.  Retroflexed views revealed no abnormalities. The time to cecum=6 minutes 17 seconds.  Withdrawal time=8 minutes 08 seconds.  The scope was withdrawn and the procedure completed. COMPLICATIONS: There were no complications.  ENDOSCOPIC IMPRESSION: 1.   Sessile polyp was found in the transverse colon; polypectomy was performed with a cold snare 2.   Moderate diverticulosis was noted in the sigmoid colon 3.   Mild diverticulosis was noted in the ascending colon 4.   The colon mucosa was otherwise  normal  RECOMMENDATIONS: If the polyp(s) removed today are proven to be adenomatous (pre-cancerous) polyps, you will need a repeat colonoscopy in 5 years.  Otherwise you should continue to follow colorectal cancer screening guidelines for "routine risk" patients with colonoscopy in 10 years.  You will receive a letter within 1-2 weeks with the results of your biopsy as well as final recommendations.  Please call my office if you have not received a letter after 3 weeks.   eSigned:  Louis Meckel, MD 03/30/2013 9:23 AM   cc:   PATIENT NAME:  Amanda Riddle, Amanda Riddle MR#: [id number]

## 2013-03-31 ENCOUNTER — Telehealth: Payer: Self-pay | Admitting: *Deleted

## 2013-03-31 ENCOUNTER — Telehealth: Payer: Self-pay | Admitting: Family Medicine

## 2013-03-31 MED ORDER — ALBUTEROL SULFATE HFA 108 (90 BASE) MCG/ACT IN AERS: 2.0000 | INHALATION_SPRAY | Freq: Four times a day (QID) | RESPIRATORY_TRACT | Status: DC | PRN

## 2013-03-31 NOTE — Telephone Encounter (Signed)
No identifier, left message, follow-up  

## 2013-03-31 NOTE — Telephone Encounter (Signed)
Rx Refilled  

## 2013-04-07 ENCOUNTER — Encounter: Payer: Self-pay | Admitting: Gastroenterology

## 2013-04-10 ENCOUNTER — Other Ambulatory Visit: Payer: Self-pay | Admitting: Family Medicine

## 2013-04-12 ENCOUNTER — Other Ambulatory Visit: Payer: Self-pay | Admitting: Family Medicine

## 2013-05-05 ENCOUNTER — Other Ambulatory Visit: Payer: Self-pay | Admitting: Family Medicine

## 2013-05-19 ENCOUNTER — Other Ambulatory Visit: Payer: Self-pay | Admitting: Family Medicine

## 2013-05-19 ENCOUNTER — Telehealth: Payer: Self-pay | Admitting: Family Medicine

## 2013-05-19 MED ORDER — HYDROCHLOROTHIAZIDE 12.5 MG PO CAPS: ORAL_CAPSULE | ORAL | Status: DC

## 2013-05-19 MED ORDER — AMLODIPINE BESYLATE 5 MG PO TABS: ORAL_TABLET | ORAL | Status: DC

## 2013-05-19 MED ORDER — ATORVASTATIN CALCIUM 40 MG PO TABS: 40.0000 mg | ORAL_TABLET | Freq: Every day | ORAL | Status: DC

## 2013-05-19 NOTE — Telephone Encounter (Signed)
Rx Refilled  

## 2013-05-19 NOTE — Telephone Encounter (Signed)
Amlodipine Besylate 5 mg Atorvastatin 40 mg HCTZ 12.5 mg   Pt wants 90 day supply of all meds please.

## 2013-05-19 NOTE — Telephone Encounter (Signed)
Medication refilled per protocol. 

## 2013-06-02 ENCOUNTER — Telehealth: Payer: Self-pay | Admitting: Family Medicine

## 2013-06-02 MED ORDER — ATORVASTATIN CALCIUM 40 MG PO TABS: 40.0000 mg | ORAL_TABLET | Freq: Every day | ORAL | Status: DC

## 2013-06-02 NOTE — Telephone Encounter (Signed)
Atorvastatin 40 mg tab 1 QD #30 °

## 2013-06-02 NOTE — Telephone Encounter (Signed)
Rx Refilled  

## 2013-07-02 ENCOUNTER — Emergency Department (HOSPITAL_COMMUNITY): Payer: Medicare Other

## 2013-07-02 ENCOUNTER — Encounter (HOSPITAL_COMMUNITY): Payer: Self-pay | Admitting: Cardiology

## 2013-07-02 ENCOUNTER — Emergency Department (HOSPITAL_COMMUNITY)
Admission: EM | Admit: 2013-07-02 | Discharge: 2013-07-02 | Disposition: A | Discharge status: Home / Self Care | Payer: Medicare Other | Attending: Emergency Medicine | Admitting: Emergency Medicine

## 2013-07-02 DIAGNOSIS — E7800 Pure hypercholesterolemia, unspecified: Secondary | ICD-10-CM | POA: Insufficient documentation

## 2013-07-02 DIAGNOSIS — M62838 Other muscle spasm: Secondary | ICD-10-CM

## 2013-07-02 DIAGNOSIS — E119 Type 2 diabetes mellitus without complications: Secondary | ICD-10-CM | POA: Insufficient documentation

## 2013-07-02 DIAGNOSIS — Z79899 Other long term (current) drug therapy: Secondary | ICD-10-CM | POA: Insufficient documentation

## 2013-07-02 DIAGNOSIS — I10 Essential (primary) hypertension: Secondary | ICD-10-CM | POA: Insufficient documentation

## 2013-07-02 DIAGNOSIS — Z794 Long term (current) use of insulin: Secondary | ICD-10-CM | POA: Insufficient documentation

## 2013-07-02 DIAGNOSIS — Z7982 Long term (current) use of aspirin: Secondary | ICD-10-CM | POA: Insufficient documentation

## 2013-07-02 LAB — CBC
HCT: 38.7 % (ref 36.0–46.0)
Hemoglobin: 13.7 g/dL (ref 12.0–15.0)
MCHC: 35.4 g/dL (ref 30.0–36.0)
RBC: 4.54 MIL/uL (ref 3.87–5.11)

## 2013-07-02 LAB — BASIC METABOLIC PANEL
BUN: 15 mg/dL (ref 6–23)
CO2: 27 mEq/L (ref 19–32)
GFR calc non Af Amer: 79 mL/min — ABNORMAL LOW (ref >90)
Glucose, Bld: 105 mg/dL — ABNORMAL HIGH (ref 70–99)
Potassium: 4.2 mEq/L (ref 3.5–5.1)

## 2013-07-02 MED ORDER — CYCLOBENZAPRINE HCL 10 MG PO TABS
10.0000 mg | ORAL_TABLET | Freq: Once | ORAL | Status: AC
  Administered 2013-07-02: 10 mg via ORAL
  Filled 2013-07-02: qty 1

## 2013-07-02 MED ORDER — CYCLOBENZAPRINE HCL 10 MG PO TABS: 10.0000 mg | ORAL_TABLET | Freq: Two times a day (BID) | ORAL | Status: DC | PRN

## 2013-07-02 MED ORDER — NAPROXEN 250 MG PO TABS
500.0000 mg | ORAL_TABLET | Freq: Once | ORAL | Status: AC
  Administered 2013-07-02: 500 mg via ORAL
  Filled 2013-07-02: qty 2

## 2013-07-02 NOTE — ED Provider Notes (Signed)
CSN: [id number]     Arrival date & time 07/02/13  1447 History   First MD Initiated Contact with Patient 07/02/13 1650     Chief Complaint  Patient presents with  . Chest Pain   (Consider location/radiation/quality/duration/timing/severity/associated sxs/prior Treatment) Patient is a 78 y.o. female presenting with chest pain. The history is provided by the patient.  Chest Pain Chest pain location: L neck, upper back. Pain quality: sharp   Pain radiates to:  Does not radiate Pain radiates to the back: no   Pain severity:  Mild Onset quality:  Sudden Duration: seconds. Timing:  Sporadic Progression:  Unchanged Chronicity:  New Context comment:  Happens when moving her head to the left. Did not wake her from sleep Relieved by:  Nothing Worsened by:  Nothing tried Ineffective treatments:  None tried Associated symptoms: no abdominal pain, no back pain, no cough, no fatigue, no fever, no headache, no nausea, no near-syncope, no syncope and not vomiting     Past Medical History  Diagnosis Date  . Hypertension   . Diabetes mellitus   . Hypercholesteremia    Past Surgical History  Procedure Laterality Date  . Abdominal hysterectomy  1986  . Tonsillectomy  1950   Family History  Problem Relation Age of Onset  . Colon cancer Neg Hx    History  Substance Use Topics  . Smoking status: Never Smoker   . Smokeless tobacco: Current User    Types: Snuff  . Alcohol Use: No   OB History   Grav Para Term Preterm Abortions TAB SAB Ect Mult Living                 Review of Systems  Constitutional: Negative for fever and fatigue.  Respiratory: Negative for cough.   Cardiovascular: Positive for chest pain. Negative for syncope and near-syncope.  Gastrointestinal: Negative for nausea, vomiting and abdominal pain.  Musculoskeletal: Negative for back pain.  Neurological: Negative for headaches.  All other systems reviewed and are negative.    Allergies  Codeine  Home  Medications   Current Outpatient Rx  Name  Route  Sig  Dispense  Refill  . albuterol (PROVENTIL HFA;VENTOLIN HFA) 108 (90 BASE) MCG/ACT inhaler   Inhalation   Inhale 2 puffs into the lungs every 6 (six) hours as needed. Shortness of breath   1 Inhaler   1   . amLODipine (NORVASC) 5 MG tablet   Oral   Take 5 mg by mouth every morning.         Marland Kitchen aspirin EC 81 MG tablet   Oral   Take 81 mg by mouth at bedtime.         Marland Kitchen atorvastatin (LIPITOR) 40 MG tablet   Oral   Take 1 tablet (40 mg total) by mouth daily.   90 tablet   1   . cholecalciferol (VITAMIN D) 1000 UNITS tablet   Oral   Take 1,000 Units by mouth every morning.          . Cyanocobalamin (VITAMIN B 12 PO)   Oral   Take 1 tablet by mouth at bedtime.          . Flaxseed, Linseed, (FLAXSEED OIL PO)   Oral   Take 1 capsule by mouth 2 (two) times daily.          . Glucosamine Sulfate-MSM (MSM-GLUCOSAMINE PO)   Oral   Take 1 capsule by mouth 2 (two) times daily.         Marland Kitchen  hydrochlorothiazide (MICROZIDE) 12.5 MG capsule   Oral   Take 12.5 mg by mouth daily.         . insulin aspart (NOVOLOG) 100 UNIT/ML injection   Subcutaneous   Inject 4 Units into the skin daily before breakfast.         . insulin glargine (LANTUS) 100 units/mL SOLN   Subcutaneous   Inject 35 Units into the skin at bedtime.         . metFORMIN (GLUCOPHAGE) 1000 MG tablet   Oral   Take 1,000 mg by mouth 2 (two) times daily with a meal.         . ONE TOUCH ULTRA TEST test strip      USE AS DIRECTED 4 TIMES A DAY PRE-MEAL AND AT BEDTIME   150 each   prn    BP 147/70  Pulse 94  Temp(Src) 98.1 F (36.7 C) (Oral)  Resp 18  SpO2 97% Physical Exam  Nursing note and vitals reviewed. Constitutional: She is oriented to person, place, and time. She appears well-developed and well-nourished. No distress.  HENT:  Head: Normocephalic and atraumatic.  Eyes: EOM are normal. Pupils are equal, round, and reactive to light.   Neck: Normal range of motion. Neck supple. No JVD present. No tracheal deviation present. No thyromegaly present.  L trapezius tenderness upon palpation  Cardiovascular: Normal rate and regular rhythm.  Exam reveals no friction rub.   No murmur heard. Pulmonary/Chest: Effort normal and breath sounds normal. No respiratory distress. She has no wheezes. She has no rales.  Abdominal: Soft. She exhibits no distension. There is no tenderness. There is no rebound.  Musculoskeletal: Normal range of motion. She exhibits no edema.  Lymphadenopathy:    She has no cervical adenopathy.  Neurological: She is alert and oriented to person, place, and time.  Skin: She is not diaphoretic.    ED Course  Procedures (including critical care time) Labs Review Labs Reviewed  BASIC METABOLIC PANEL - Abnormal; Notable for the following:    Glucose, Bld 105 (*)    GFR calc non Af Amer 79 (*)    All other components within normal limits  CBC  POCT I-STAT TROPONIN I   Imaging Review Dg Chest 2 View  07/02/2013   CLINICAL DATA:  78 year old female with pain radiating from the chest to the left upper extremity.  EXAM: CHEST  2 VIEW  COMPARISON:  02/05/2013.  FINDINGS: Normal lung volumes. Normal cardiac size and mediastinal contours. Visualized tracheal air column is within normal limits. The lungs are clear. No pneumothorax or effusion. No acute osseous abnormality identified.  IMPRESSION: No active cardiopulmonary disease.   Electronically Signed   By: Augusto Gamble M.D.   On: 07/02/2013 15:49    Date: 07/03/2013  Rate: 94  Rhythm: normal sinus rhythm  QRS Axis: normal  Intervals: normal  ST/T Wave abnormalities: normal  Conduction Disutrbances:none  Narrative Interpretation:   Old EKG Reviewed: none available    MDM   1. Muscle spasms of neck    47F presents with left-sided neck pain. Patient reported chest pain to triage, however pain is located above her left clavicle in her trapezius. When patient  is asked to she's having chest pain and I placed my hand in her chest to locate her chest, she states no all her pain as above her clavicle in her left trapezius. Started this morning at 3 AM and only occurs when she moves her head to the left. She  has no injury, trauma, fall. She denies any dizziness, headache, vision changes, any other chest pain, shortness of breath. She denies a left arm numbness or tingling. Her vitals are stable here with normal sats in normal sinus rhythm on EKG. Her initial troponin is 0, which was ordered in triage. I do not feel she needs a delta troponin as she is having musculoskeletal neck pain, and not any actual chest pain. She states no pain at rest and only pain when she turns her head to the left. On palpation of the left trapezius, she is having pain there. I will give her Flexeril and Motrin for her pain.  Patient is feeling better after flexeril, stable for discharge.    Dagmar Hait, MD 07/03/13 [phone]

## 2013-07-02 NOTE — ED Notes (Signed)
Pt report midsternal chest pain that started around 0300 this morning. States the pain is now up into her shoulder and left arm. Denies any n/v or SOB with the pain. Pt reports increased pain when she turns her head to the left. Denies any cardiac hx.

## 2013-07-02 NOTE — ED Notes (Addendum)
Pt reports left sided intermittent cp that is now in her left shoulder. Pt states pain started at 0300 she took alka seltzer because she thought it was gas due to what she ate prior to going to sleep. Pt states she had no relief. Pt denies any n/v, or sob. Pt rates pain 7/10. Pt states nothing make pain better but deep breaths and turning head to left sometimes makes pain worse. Pt denies having these sx before or any cardiac hx.

## 2013-07-05 ENCOUNTER — Encounter: Payer: Self-pay | Admitting: Family Medicine

## 2013-07-05 ENCOUNTER — Ambulatory Visit (INDEPENDENT_AMBULATORY_CARE_PROVIDER_SITE_OTHER): Payer: Medicare Other | Admitting: Family Medicine

## 2013-07-05 VITALS — BP 130/76 | HR 80 | Temp 97.1°F | Resp 20 | Wt 204.0 lb

## 2013-07-05 DIAGNOSIS — M25512 Pain in left shoulder: Secondary | ICD-10-CM

## 2013-07-05 DIAGNOSIS — Z23 Encounter for immunization: Secondary | ICD-10-CM

## 2013-07-05 DIAGNOSIS — M62838 Other muscle spasm: Secondary | ICD-10-CM

## 2013-07-05 DIAGNOSIS — M25519 Pain in unspecified shoulder: Secondary | ICD-10-CM

## 2013-07-05 MED ORDER — NAPROXEN 500 MG PO TABS: 500.0000 mg | ORAL_TABLET | Freq: Two times a day (BID) | ORAL | Status: DC

## 2013-07-05 MED ORDER — TIZANIDINE HCL 4 MG PO CAPS: 4.0000 mg | ORAL_CAPSULE | Freq: Every evening | ORAL | Status: DC | PRN

## 2013-07-05 MED ORDER — INSULIN ASPART 100 UNIT/ML ~~LOC~~ SOLN: 8.0000 [IU] | Freq: Every day | SUBCUTANEOUS | Status: DC

## 2013-07-05 NOTE — Patient Instructions (Addendum)
F/U in 4 weeks for your diabetes, come fasting Take the naprosyn twice with food Take the muscle relaxer at night Alternate heat and ICE which ever feels better Call if it does not improve

## 2013-07-06 DIAGNOSIS — M25519 Pain in unspecified shoulder: Secondary | ICD-10-CM | POA: Insufficient documentation

## 2013-07-06 DIAGNOSIS — M62838 Other muscle spasm: Secondary | ICD-10-CM | POA: Insufficient documentation

## 2013-07-06 NOTE — Progress Notes (Signed)
  Subjective:    Patient ID: Amanda Riddle, female    DOB: 11-11-1934, 78 y.o.   MRN: [id number]  HPI  Patient presents with left arm pain for the past 3 days. She was seen in the emergency room on Friday when she woke up with left-sided shoulder pain and chest discomfort. She denies any shortness of breath diaphoresis nausea vomiting associated. She felt like something was more stabbing and pulling in her shoulder and her neck. She was evaluated in the ER. EKG was unremarkable troponins were negative CBC and metabolic panel were unremarkable. Chest x-ray was also negative. She was given anti-inflammatories which improved pain and she was also discharged with prescription for Flexeril but states she had some at home that she took but it did not help. She has been taking Motrin 400 mg as needed and this has been improving her pain. She states she does remember caring and 2 large cases of water on Friday and this may have set off the pain. She denies any current chest pain or shortness of breath. The pain in her shoulder comes and goes.  Review of Systems  GEN- denies fatigue, fever, weight loss,weakness, recent illness HEENT- denies eye drainage, change in vision, nasal discharge, CVS- denies chest pain, palpitations RESP- denies SOB, cough, wheeze ABD- denies N/V, change in stools, abd pain GU- denies dysuria, hematuria, dribbling, incontinence MSK-+ joint pain, muscle aches, injury Neuro- denies headache, dizziness, syncope, seizure activity      Objective:   Physical Exam  GEN- NAD, alert and oriented x3 HEENT- PERRL, EOMI, non injected sclera, pink conjunctiva, MMM, oropharynx clear Neck- Supple, fair ROM, pain with lateral rotation to right, +spasm trapezius left side CVS- RRR, no murmur RESP-CTAB MSK- Spine NT, Rotator cuff in tact, TTP over palpation of shoulder blade. No winged scapula, biceps in tact Neuro- CNII-XII in tact, no focal deficits, sensation in tact, normal tone, equal  strength bilat UE EXT- No edema Pulses- Radial 2+       Assessment & Plan:

## 2013-07-06 NOTE — Assessment & Plan Note (Addendum)
She has fairly good range of motion. Will start her on prescription anti-inflammatories with food. She denies the shoulder as well. If no improvement consider x-ray of shoulder versus orthopedic intervention

## 2013-07-06 NOTE — Assessment & Plan Note (Signed)
Change or muscle relaxant Zanaflex as this is preferred and elderly. She can use at bedtime.

## 2013-07-13 ENCOUNTER — Telehealth: Payer: Self-pay | Admitting: Family Medicine

## 2013-07-13 NOTE — Telephone Encounter (Signed)
Pt states that insurance will no longer cover novolog but will start covering Humalog. Can we change this?

## 2013-07-13 NOTE — Telephone Encounter (Signed)
Yes, novolog and humalog can be changed dose for dose.

## 2013-07-15 MED ORDER — INSULIN LISPRO 100 UNIT/ML (KWIKPEN): 8.0000 [IU] | PEN_INJECTOR | Freq: Every day | SUBCUTANEOUS | Status: DC

## 2013-07-15 NOTE — Telephone Encounter (Signed)
rx sent to pharm

## 2013-07-28 ENCOUNTER — Telehealth: Payer: Self-pay | Admitting: Family Medicine

## 2013-07-28 NOTE — Telephone Encounter (Signed)
Pharmacy is calling regarding diagnoses cod on test strips.  [facility]  [employer] Spoke with [place]

## 2013-07-28 NOTE — Telephone Encounter (Signed)
Pharmacy called

## 2013-08-02 ENCOUNTER — Ambulatory Visit (INDEPENDENT_AMBULATORY_CARE_PROVIDER_SITE_OTHER): Payer: Medicare Other | Admitting: Family Medicine

## 2013-08-02 ENCOUNTER — Encounter: Payer: Self-pay | Admitting: Family Medicine

## 2013-08-02 VITALS — BP 140/72 | HR 80 | Temp 97.6°F | Resp 16 | Ht 65.5 in | Wt 206.0 lb

## 2013-08-02 DIAGNOSIS — E119 Type 2 diabetes mellitus without complications: Secondary | ICD-10-CM

## 2013-08-02 LAB — COMPLETE METABOLIC PANEL WITH GFR
ALT: 20 U/L (ref 0–35)
Albumin: 4 g/dL (ref 3.5–5.2)
CO2: 25 mEq/L (ref 19–32)
Calcium: 9.5 mg/dL (ref 8.4–10.5)
Chloride: 107 mEq/L (ref 96–112)
GFR, Est African American: >89 mL/min
GFR, Est Non African American: 78 mL/min
Glucose, Bld: 108 mg/dL — ABNORMAL HIGH (ref 70–99)
Potassium: 4 mEq/L (ref 3.5–5.3)
Sodium: 141 mEq/L (ref 135–145)
Total Bilirubin: 1.2 mg/dL (ref 0.3–1.2)
Total Protein: 6.7 g/dL (ref 6.0–8.3)

## 2013-08-02 LAB — LIPID PANEL
LDL Cholesterol: 102 mg/dL — ABNORMAL HIGH (ref 0–99)
Total CHOL/HDL Ratio: 3.3 Ratio
VLDL: 33 mg/dL (ref 0–40)

## 2013-08-02 MED ORDER — METFORMIN HCL 1000 MG PO TABS: 1000.0000 mg | ORAL_TABLET | Freq: Two times a day (BID) | ORAL | Status: DC

## 2013-08-02 NOTE — Progress Notes (Signed)
Subjective:    Patient ID: Amanda Riddle, female    DOB: 11-Mar-1935, 78 y.o.   MRN: [id number]  HPI Patient is a very pleasant 51 American female with a history of hypertension hyperlipidemia and diabetes mellitus type 2 patient is here today to recheck her medical problems. Her blood pressure is currently adequately controlled at 140/72. She denies any chest pain, shortness of breath, dyspnea on exertion. She still is taking Lipitor 40 mg by mouth daily for hyperlipidemia. She denies myalgia right quadrant pain. She is also currently taking metformin 1000 mg by mouth twice a day and Lantus 35 units subcutaneous each bedtime. Her fasting blood sugars are less than 130. Throughout postprandial sugars are mainly less than 160. She denies any episodes of hypoglycemia. She denies any blurry vision, polyuria, polydipsia. She denies a neuropathy in the feet. She has no sores or ulcers on the feet. Past Medical History  Diagnosis Date  . Hypertension   . Diabetes mellitus   . Hypercholesteremia    Current Outpatient Prescriptions on File Prior to Visit  Medication Sig Dispense Refill  . albuterol (PROVENTIL HFA;VENTOLIN HFA) 108 (90 BASE) MCG/ACT inhaler Inhale 2 puffs into the lungs every 6 (six) hours as needed. Shortness of breath  1 Inhaler  1  . amLODipine (NORVASC) 5 MG tablet Take 5 mg by mouth every morning.      Marland Kitchen aspirin EC 81 MG tablet Take 81 mg by mouth at bedtime.      Marland Kitchen atorvastatin (LIPITOR) 40 MG tablet Take 1 tablet (40 mg total) by mouth daily.  90 tablet  1  . cholecalciferol (VITAMIN D) 1000 UNITS tablet Take 1,000 Units by mouth every morning.       . Cyanocobalamin (VITAMIN B 12 PO) Take 1 tablet by mouth at bedtime.       . cyclobenzaprine (FLEXERIL) 10 MG tablet Take 1 tablet (10 mg total) by mouth 2 (two) times daily as needed for muscle spasms.  20 tablet  0  . Flaxseed, Linseed, (FLAXSEED OIL PO) Take 1 capsule by mouth 2 (two) times daily.       . Glucosamine Sulfate-MSM  (MSM-GLUCOSAMINE PO) Take 1 capsule by mouth 2 (two) times daily.      . hydrochlorothiazide (MICROZIDE) 12.5 MG capsule Take 12.5 mg by mouth daily.      . insulin glargine (LANTUS) 100 units/mL SOLN Inject 35 Units into the skin at bedtime.       . naproxen (NAPROSYN) 500 MG tablet Take 1 tablet (500 mg total) by mouth 2 (two) times daily with a meal.  60 tablet  2  . ONE TOUCH ULTRA TEST test strip USE AS DIRECTED 4 TIMES A DAY PRE-MEAL AND AT BEDTIME  150 each  prn  . tiZANidine (ZANAFLEX) 4 MG capsule Take 1 capsule (4 mg total) by mouth at bedtime as needed for muscle spasms.  30 capsule  0   No current facility-administered medications on file prior to visit.   Allergies  Allergen Reactions  . Codeine Itching   History   Social History  . Marital Status: Divorced    Spouse Name: N/A    Number of Children: N/A  . Years of Education: N/A   Occupational History  . Not on file.   Social History Main Topics  . Smoking status: Never Smoker   . Smokeless tobacco: Current User    Types: Snuff  . Alcohol Use: No  . Drug Use: No  . Sexual Activity:  Not on file   Other Topics Concern  . Not on file   Social History Narrative  . No narrative on file      Review of Systems  All other systems reviewed and are negative.       Objective:   Physical Exam  Vitals reviewed. Neck: Neck supple. No thyromegaly present.  Cardiovascular: Normal rate, regular rhythm, normal heart sounds and intact distal pulses.   No murmur heard. Pulmonary/Chest: Effort normal and breath sounds normal. No respiratory distress. She has no wheezes. She has no rales.  Abdominal: Soft. Bowel sounds are normal. She exhibits no distension. There is no tenderness. There is no rebound and no guarding.  Musculoskeletal: She exhibits no edema.          Assessment & Plan:  1. Type II or unspecified type diabetes mellitus without mention of complication, not stated as uncontrolled Check a  hemoglobin A1c. The patient's goal hemoglobin A1c is less than 7. I recommended 15-20 pound weight loss. Recommended increasing her daily aerobic exercise. Her blood pressures within acceptable limits given her age. I also check a fasting lipid panel. Her goal LDL is less than 100. The patient had her flu vaccine.  Recheck in 3 months. - COMPLETE METABOLIC PANEL WITH GFR - Hemoglobin A1c - Lipid panel

## 2013-10-04 ENCOUNTER — Other Ambulatory Visit: Payer: Self-pay | Admitting: Family Medicine

## 2013-10-11 ENCOUNTER — Other Ambulatory Visit: Payer: Self-pay | Admitting: Family Medicine

## 2013-10-11 MED ORDER — INSULIN PEN NEEDLE 31G X 5 MM MISC: Status: DC

## 2013-10-11 NOTE — Telephone Encounter (Signed)
Rx Refilled  

## 2013-10-19 ENCOUNTER — Other Ambulatory Visit: Payer: Self-pay | Admitting: Family Medicine

## 2013-10-19 MED ORDER — INSULIN PEN NEEDLE 31G X 5 MM MISC: Status: DC

## 2013-10-22 ENCOUNTER — Other Ambulatory Visit: Payer: Self-pay | Admitting: Family Medicine

## 2013-10-22 DIAGNOSIS — Z1231 Encounter for screening mammogram for malignant neoplasm of breast: Secondary | ICD-10-CM

## 2013-11-02 ENCOUNTER — Encounter: Payer: Self-pay | Admitting: Family Medicine

## 2013-11-02 ENCOUNTER — Ambulatory Visit (INDEPENDENT_AMBULATORY_CARE_PROVIDER_SITE_OTHER): Payer: Medicare HMO | Admitting: Family Medicine

## 2013-11-02 VITALS — BP 118/68 | HR 67 | Temp 97.1°F | Resp 18 | Wt 212.0 lb

## 2013-11-02 DIAGNOSIS — I10 Essential (primary) hypertension: Secondary | ICD-10-CM

## 2013-11-02 DIAGNOSIS — E119 Type 2 diabetes mellitus without complications: Secondary | ICD-10-CM

## 2013-11-02 DIAGNOSIS — E785 Hyperlipidemia, unspecified: Secondary | ICD-10-CM

## 2013-11-02 DIAGNOSIS — Z23 Encounter for immunization: Secondary | ICD-10-CM

## 2013-11-02 DIAGNOSIS — R0602 Shortness of breath: Secondary | ICD-10-CM

## 2013-11-02 LAB — COMPLETE METABOLIC PANEL WITH GFR
ALT: 10 U/L (ref 0–35)
AST: 15 U/L (ref 0–37)
Albumin: 4.1 g/dL (ref 3.5–5.2)
Alkaline Phosphatase: 73 U/L (ref 39–117)
BILIRUBIN TOTAL: 0.9 mg/dL (ref 0.3–1.2)
BUN: 12 mg/dL (ref 6–23)
CO2: 22 meq/L (ref 19–32)
CREATININE: 0.86 mg/dL (ref 0.50–1.10)
Calcium: 9.5 mg/dL (ref 8.4–10.5)
Chloride: 105 mEq/L (ref 96–112)
GFR, EST AFRICAN AMERICAN: 76 mL/min
GFR, EST NON AFRICAN AMERICAN: 66 mL/min
GLUCOSE: 166 mg/dL — AB (ref 70–99)
Potassium: 3.8 mEq/L (ref 3.5–5.3)
SODIUM: 142 meq/L (ref 135–145)
TOTAL PROTEIN: 6.8 g/dL (ref 6.0–8.3)

## 2013-11-02 LAB — LIPID PANEL
Cholesterol: 202 mg/dL — ABNORMAL HIGH (ref 0–200)
HDL: 57 mg/dL (ref >39)
LDL Cholesterol: 109 mg/dL — ABNORMAL HIGH (ref 0–99)
Total CHOL/HDL Ratio: 3.5 Ratio
Triglycerides: 180 mg/dL — ABNORMAL HIGH (ref <150)
VLDL: 36 mg/dL (ref 0–40)

## 2013-11-02 LAB — HEMOGLOBIN A1C
HEMOGLOBIN A1C: 7.9 % — AB (ref <5.7)
Mean Plasma Glucose: 180 mg/dL — ABNORMAL HIGH (ref <117)

## 2013-11-02 NOTE — Addendum Note (Signed)
Addended by: Shary Decamp B on: 11/02/2013 09:30 AM   Modules accepted: Orders

## 2013-11-02 NOTE — Progress Notes (Signed)
Subjective:    Patient ID: Amanda Riddle, female    DOB: [birth year], 79 y.o.   MRN: [id number]  HPI 08/02/13 Patient is a very pleasant 57 American female with a history of hypertension hyperlipidemia and diabetes mellitus type 2 patient is here today to recheck her medical problems. Her blood pressure is currently adequately controlled at 140/72. She denies any chest pain, shortness of breath, dyspnea on exertion. She still is taking Lipitor 40 mg by mouth daily for hyperlipidemia. She denies myalgia right quadrant pain. She is also currently taking metformin 1000 mg by mouth twice a day and Lantus 35 units subcutaneous each bedtime. Her fasting blood sugars are less than 130. Throughout postprandial sugars are mainly less than 160. She denies any episodes of hypoglycemia. She denies any blurry vision, polyuria, polydipsia. She denies a neuropathy in the feet. She has no sores or ulcers on the feet.  At that time, my plan was: 1. Type II or unspecified type diabetes mellitus without mention of complication, not stated as uncontrolled Check a hemoglobin A1c. The patient's goal hemoglobin A1c is less than 7. I recommended 15-20 pound weight loss. Recommended increasing her daily aerobic exercise. Her blood pressures within acceptable limits given her age. I also check a fasting lipid panel. Her goal LDL is less than 100. The patient had her flu vaccine.  Recheck in 3 months. - COMPLETE METABOLIC PANEL WITH GFR - Hemoglobin A1c - Lipid panel Office Visit on 08/02/2013  Component Date Value Range Status  . Sodium 08/02/2013 141  135 - 145 mEq/L Final  . Potassium 08/02/2013 4.0  3.5 - 5.3 mEq/L Final  . Chloride 08/02/2013 107  96 - 112 mEq/L Final  . CO2 08/02/2013 25  19 - 32 mEq/L Final  . Glucose, Bld 08/02/2013 108* 70 - 99 mg/dL Final  . BUN 08/02/2013 18  6 - 23 mg/dL Final  . Creat 08/02/2013 0.75  0.50 - 1.10 mg/dL Final  . Total Bilirubin 08/02/2013 1.2  0.3 - 1.2 mg/dL Final  . Alkaline  Phosphatase 08/02/2013 73  39 - 117 U/L Final  . AST 08/02/2013 21  0 - 37 U/L Final  . ALT 08/02/2013 20  0 - 35 U/L Final  . Total Protein 08/02/2013 6.7  6.0 - 8.3 g/dL Final  . Albumin 08/02/2013 4.0  3.5 - 5.2 g/dL Final  . Calcium 08/02/2013 9.5  8.4 - 10.5 mg/dL Final  . GFR, Est African American 08/02/2013 >89   Final  . GFR, Est Non African American 08/02/2013 78   Final   Comment:                            The estimated GFR is a calculation valid for adults (>=27 years old)                          that uses the CKD-EPI algorithm to adjust for age and sex. It is                            not to be used for children, pregnant women, hospitalized patients,                             patients on dialysis, or with rapidly changing kidney function.  According to the NKDEP, eGFR >89 is normal, 60-89 shows mild                          impairment, 30-59 shows moderate impairment, 15-29 shows severe                          impairment and <15 is ESRD.                             Marland Kitchen Hemoglobin A1C 08/02/2013 7.1* <5.7 % Final   Comment:                                                                                                 According to the ADA Clinical Practice Recommendations for 2011, when                          HbA1c is used as a screening test:                                                       >=6.5%   Diagnostic of Diabetes Mellitus                                     (if abnormal result is confirmed)                                                     5.7-6.4%   Increased risk of developing Diabetes Mellitus                                                     References:Diagnosis and Classification of Diabetes Mellitus,Diabetes                          IHKV,4259,56(LOVFI 1):S62-S69 and Standards of Medical Care in                                  Diabetes - 2011,Diabetes [id number],9518,84 (Suppl 1):S11-S61.                             . Mean  Plasma Glucose 08/02/2013 157* <117 mg/dL Final  . Cholesterol 08/02/2013 195  0 - 200 mg/dL Final   Comment: ATP III Classification:                                <  200        mg/dL        Desirable                               200 - 239     mg/dL        Borderline High                               >= 240        mg/dL        High                             . Triglycerides 08/02/2013 167* <150 mg/dL Final  . HDL 08/02/2013 60  >39 mg/dL Final  . Total CHOL/HDL Ratio 08/02/2013 3.3   Final  . VLDL 08/02/2013 33  0 - 40 mg/dL Final  . LDL Cholesterol 08/02/2013 102* 0 - 99 mg/dL Final   Comment:                            Total Cholesterol/HDL Ratio:CHD Risk                                                 Coronary Heart Disease Risk Table                                                                 Men       Women                                   1/2 Average Risk              3.4        3.3                                       Average Risk              5.0        4.4                                    2X Average Risk              9.6        7.1                                    3X Average Risk             23.4       11.0  Use the calculated Patient Ratio above and the CHD Risk table                           to determine the patient's CHD Risk.                          ATP III Classification (LDL):                                < 100        mg/dL         Optimal                               100 - 129     mg/dL         Near or Above Optimal                               130 - 159     mg/dL         Borderline High                               160 - 189     mg/dL         High                                > 190        mg/dL         Very High                              11/02/13 Patient presents today for a recheck. Her LDL cholesterol was 102 and was acceptable at last office visit. Her hemoglobin A1c was acceptable given her age.  Therefore no changes  were made in her medication regimen at the last office visit. Today however the patient presents with progressive dyspnea on exertion. She reports wheezing on a daily basis. She states that she is audibly wheezing at times. Jed history of 20 pack years of secondhand smoke exposure through her ex-husband. 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  In the past she is used an albuterol inhaler as needed for wheezing and has felt temporary relief.  She is also due for Prevnar 13. The remainder of her review of systems is negative. She denies any chest pain, shortness of breath, dyspnea on exertion, nausea, right upper quadrant pain. She's having no hypoglycemic episodes.    Past Medical History  Diagnosis Date  . Hypertension   . Diabetes mellitus   . Hypercholesteremia    Current Outpatient Prescriptions on File Prior to Visit  Medication Sig Dispense Refill  . albuterol (PROVENTIL HFA;VENTOLIN HFA) 108 (90 BASE) MCG/ACT inhaler Inhale 2 puffs into the lungs every 6 (six) hours as needed. Shortness of breath  1 Inhaler  1  . amLODipine (NORVASC) 5 MG tablet Take 5 mg by mouth every morning.      Marland Kitchen aspirin EC 81 MG tablet Take 81 mg  by mouth at bedtime.      Marland Kitchen atorvastatin (LIPITOR) 40 MG tablet Take 1 tablet (40 mg total) by mouth daily.  90 tablet  1  . cholecalciferol (VITAMIN D) 1000 UNITS tablet Take 1,000 Units by mouth every morning.       . Cyanocobalamin (VITAMIN B 12 PO) Take 1 tablet by mouth at bedtime.       . cyclobenzaprine (FLEXERIL) 10 MG tablet Take 1 tablet (10 mg total) by mouth 2 (two) times daily as needed for muscle spasms.  20 tablet  0  . Flaxseed, Linseed, (FLAXSEED OIL PO) Take 1 capsule by mouth 2 (two) times daily.       . Glucosamine Sulfate-MSM (MSM-GLUCOSAMINE PO) Take 1 capsule by mouth 2 (two) times daily.      . hydrochlorothiazide (MICROZIDE) 12.5 MG capsule Take 12.5 mg by mouth daily.      . insulin glargine  (LANTUS) 100 units/mL SOLN Inject 35 Units into the skin at bedtime.       . Insulin Pen Needle (B-D UF III MINI PEN NEEDLES) 31G X 5 MM MISC As directed to check BS qid Dx 250.00  100 each  11  . metFORMIN (GLUCOPHAGE) 1000 MG tablet Take 1 tablet (1,000 mg total) by mouth 2 (two) times daily with a meal.  180 tablet  3  . naproxen (NAPROSYN) 500 MG tablet TAKE 1 TABLET BY MOUTH TWICE A DAY WITH A MEAL  60 tablet  2  . ONE TOUCH ULTRA TEST test strip USE AS DIRECTED 4 TIMES A DAY PRE-MEAL AND AT BEDTIME  150 each  prn  . tiZANidine (ZANAFLEX) 4 MG capsule Take 1 capsule (4 mg total) by mouth at bedtime as needed for muscle spasms.  30 capsule  0   No current facility-administered medications on file prior to visit.   Allergies  Allergen Reactions  . Codeine Itching   History   Social History  . Marital Status: Divorced    Spouse Name: N/A    Number of Children: N/A  . Years of Education: N/A   Occupational History  . Not on file.   Social History Main Topics  . Smoking status: Never Smoker   . Smokeless tobacco: Current User    Types: Snuff  . Alcohol Use: No  . Drug Use: No  . Sexual Activity: Not on file   Other Topics Concern  . Not on file   Social History Narrative  . No narrative on file      Review of Systems  All other systems reviewed and are negative.       Objective:   Physical Exam  Vitals reviewed. Neck: Neck supple. No thyromegaly present.  Cardiovascular: Normal rate, regular rhythm, normal heart sounds and intact distal pulses.   No murmur heard. Pulmonary/Chest: Effort normal and breath sounds normal. No respiratory distress. She has no wheezes. She has no rales.  Abdominal: Soft. Bowel sounds are normal. She exhibits no distension. There is no tenderness. There is no rebound and no guarding.  Musculoskeletal: She exhibits no edema.          Assessment & Plan:   1. Type II or unspecified type diabetes mellitus without mention of  complication, not stated as uncontrolled Check hemoglobin A1c. Patient's goal hemoglobin A1c is less than 7.  Patient did receive Prevnar 13 today at the office the - COMPLETE METABOLIC PANEL WITH GFR - Hemoglobin A1c  2. HTN (hypertension) Patient's blood pressure is well  controlled today. No change in - COMPLETE METABOLIC PANEL WITH GFR  3. HLD (hyperlipidemia) Check fasting lipid panel. Goal LDL is less than 100 - COMPLETE METABOLIC PANEL WITH GFR - Lipid panel  4. SOB (shortness of breath) PFT's were performed today in the office revealing fev1/fvc ratio of 70%. However her FEV1 was 100% of predicted.  However the patient is adamant that the shortness of breath is getting worse as well as the wheezing. Because of her wheezing on not adding ACE inhibitor to this patient for renal protection as I'm concerned about exacerbating her wheezing.  I will start the patient on Breo 1 inhalation daily and recheck in one month to see if her shortness of breath is improving. Her shortness of breath is not improving, I recommend a chest x-ray as well as possibly a echo.

## 2013-11-05 ENCOUNTER — Ambulatory Visit (HOSPITAL_COMMUNITY)
Admission: RE | Admit: 2013-11-05 | Discharge: 2013-11-05 | Disposition: A | Discharge status: Home / Self Care | Payer: Medicare PPO | Source: Ambulatory Visit | Attending: Family Medicine | Admitting: Family Medicine

## 2013-11-05 DIAGNOSIS — Z1231 Encounter for screening mammogram for malignant neoplasm of breast: Secondary | ICD-10-CM | POA: Insufficient documentation

## 2013-11-14 ENCOUNTER — Other Ambulatory Visit: Payer: Self-pay | Admitting: Family Medicine

## 2013-11-22 ENCOUNTER — Other Ambulatory Visit: Payer: Self-pay | Admitting: Family Medicine

## 2013-11-25 ENCOUNTER — Encounter: Payer: Self-pay | Admitting: Family Medicine

## 2013-12-03 ENCOUNTER — Other Ambulatory Visit: Payer: Self-pay | Admitting: Family Medicine

## 2013-12-03 NOTE — Telephone Encounter (Signed)
Refill appropriate and filled per protocol. 

## 2013-12-26 ENCOUNTER — Other Ambulatory Visit: Payer: Self-pay | Admitting: Family Medicine

## 2013-12-28 ENCOUNTER — Other Ambulatory Visit: Payer: Self-pay | Admitting: Family Medicine

## 2013-12-28 ENCOUNTER — Telehealth: Payer: Self-pay | Admitting: Family Medicine

## 2013-12-28 MED ORDER — AZITHROMYCIN 250 MG PO TABS: ORAL_TABLET | ORAL | Status: DC

## 2013-12-28 NOTE — Telephone Encounter (Signed)
Call back number is [place] Pt is coughing,wheezing,chest stopped up,nose running, can she have something called in for her

## 2013-12-28 NOTE — Telephone Encounter (Signed)
Patient aware.

## 2013-12-28 NOTE — Telephone Encounter (Signed)
I have escribed a z-pack.

## 2014-01-12 LAB — HM DIABETES EYE EXAM

## 2014-01-31 ENCOUNTER — Ambulatory Visit (INDEPENDENT_AMBULATORY_CARE_PROVIDER_SITE_OTHER): Payer: Medicare HMO | Admitting: Family Medicine

## 2014-01-31 ENCOUNTER — Encounter: Payer: Self-pay | Admitting: Family Medicine

## 2014-01-31 VITALS — BP 122/74 | HR 78 | Temp 96.7°F | Resp 16 | Ht 65.5 in | Wt 215.0 lb

## 2014-01-31 DIAGNOSIS — E785 Hyperlipidemia, unspecified: Secondary | ICD-10-CM

## 2014-01-31 DIAGNOSIS — E1165 Type 2 diabetes mellitus with hyperglycemia: Principal | ICD-10-CM

## 2014-01-31 DIAGNOSIS — IMO0001 Reserved for inherently not codable concepts without codable children: Secondary | ICD-10-CM

## 2014-01-31 DIAGNOSIS — I10 Essential (primary) hypertension: Secondary | ICD-10-CM

## 2014-01-31 LAB — LIPID PANEL
Cholesterol: 178 mg/dL (ref 0–200)
HDL: 52 mg/dL (ref >39)
LDL Cholesterol: 87 mg/dL (ref 0–99)
Total CHOL/HDL Ratio: 3.4 Ratio
Triglycerides: 194 mg/dL — ABNORMAL HIGH (ref <150)
VLDL: 39 mg/dL (ref 0–40)

## 2014-01-31 LAB — COMPLETE METABOLIC PANEL WITH GFR
ALT: 12 U/L (ref 0–35)
AST: 15 U/L (ref 0–37)
Albumin: 4 g/dL (ref 3.5–5.2)
Alkaline Phosphatase: 75 U/L (ref 39–117)
BUN: 17 mg/dL (ref 6–23)
CALCIUM: 9.5 mg/dL (ref 8.4–10.5)
CO2: 20 meq/L (ref 19–32)
CREATININE: 0.9 mg/dL (ref 0.50–1.10)
Chloride: 105 mEq/L (ref 96–112)
GFR, EST AFRICAN AMERICAN: 72 mL/min
GFR, Est Non African American: 62 mL/min
Glucose, Bld: 132 mg/dL — ABNORMAL HIGH (ref 70–99)
Potassium: 3.9 mEq/L (ref 3.5–5.3)
Sodium: 139 mEq/L (ref 135–145)
Total Bilirubin: 1.1 mg/dL (ref 0.2–1.2)
Total Protein: 6.3 g/dL (ref 6.0–8.3)

## 2014-01-31 LAB — HEMOGLOBIN A1C
HEMOGLOBIN A1C: 7.7 % — AB (ref <5.7)
MEAN PLASMA GLUCOSE: 174 mg/dL — AB (ref <117)

## 2014-01-31 NOTE — Progress Notes (Signed)
Subjective:    Patient ID: Amanda Riddle, female    DOB: [birth year], 79 y.o.   MRN: [id number]  HPI Patient is type 2 diabetes mellitus. She is currently on Lantus 40 units subcutaneous daily along with metformin. Her fasting blood sugars range 115 to 150. Her 2 postprandial sugars range 130 to 200. She denies any hypoglycemia. She has gained significant weight after she is retired. She is become relatively inactive. She is not exercising.  She denies any chest pain shortness of breath or dyspnea on exertion. She denies any myalgias or right upper quadrant pain on the Lipitor. She is taking aspirin 81 mg daily. Past Medical History  Diagnosis Date  . Hypertension   . Diabetes mellitus   . Hypercholesteremia    Current Outpatient Prescriptions on File Prior to Visit  Medication Sig Dispense Refill  . albuterol (PROVENTIL HFA;VENTOLIN HFA) 108 (90 BASE) MCG/ACT inhaler Inhale 2 puffs into the lungs every 6 (six) hours as needed. Shortness of breath  1 Inhaler  1  . amLODipine (NORVASC) 5 MG tablet TAKE 1 TABLET BY MOUTH EVERY DAY  90 tablet  1  . aspirin EC 81 MG tablet Take 81 mg by mouth at bedtime.      Marland Kitchen atorvastatin (LIPITOR) 40 MG tablet Take 1 tablet (40 mg total) by mouth daily.  90 tablet  1  . azithromycin (ZITHROMAX) 250 MG tablet 2 tabs poqday1, 1 tab poqday 2-5  6 tablet  0  . cholecalciferol (VITAMIN D) 1000 UNITS tablet Take 1,000 Units by mouth every morning.       . Cyanocobalamin (VITAMIN B 12 PO) Take 1 tablet by mouth at bedtime.       . Flaxseed, Linseed, (FLAXSEED OIL PO) Take 1 capsule by mouth 2 (two) times daily.       . Glucosamine Sulfate-MSM (MSM-GLUCOSAMINE PO) Take 1 capsule by mouth 2 (two) times daily.      . hydrochlorothiazide (MICROZIDE) 12.5 MG capsule TAKE 1 TABLET BY MOUTH DAILY  90 capsule  1  . hydrochlorothiazide (MICROZIDE) 12.5 MG capsule TAKE 1 TABLET BY MOUTH DAILY  90 capsule  4  . insulin glargine (LANTUS) 100 units/mL SOLN Inject 40 Units into  the skin at bedtime.       . Insulin Pen Needle (B-D UF III MINI PEN NEEDLES) 31G X 5 MM MISC As directed to check BS qid Dx 250.00  100 each  11  . LANTUS SOLOSTAR 100 UNIT/ML Solostar Pen INJECT 35 UNITS INTO THE SKIN AT BEDTIME  15 pen  5  . metFORMIN (GLUCOPHAGE) 1000 MG tablet Take 1 tablet (1,000 mg total) by mouth 2 (two) times daily with a meal.  180 tablet  3  . naproxen (NAPROSYN) 500 MG tablet TAKE 1 TABLET BY MOUTH TWICE A DAY WITH A MEAL  60 tablet  2  . ONE TOUCH ULTRA TEST test strip USE AS DIRECTED 4 TIMES A DAY PRE-MEAL AND AT BEDTIME  150 each  prn   No current facility-administered medications on file prior to visit.   Allergies  Allergen Reactions  . Codeine Itching   History   Social History  . Marital Status: Divorced    Spouse Name: N/A    Number of Children: N/A  . Years of Education: N/A   Occupational History  . Not on file.   Social History Main Topics  . Smoking status: Never Smoker   . Smokeless tobacco: Current User    Types: Snuff  .  Alcohol Use: No  . Drug Use: No  . Sexual Activity: Not on file   Other Topics Concern  . Not on file   Social History Narrative  . No narrative on file      Review of Systems  All other systems reviewed and are negative.      Objective:   Physical Exam  Vitals reviewed. Constitutional: She appears well-developed and well-nourished.  Neck: Neck supple. No JVD present. No thyromegaly present.  Cardiovascular: Normal rate, regular rhythm and normal heart sounds.   No murmur heard. Pulmonary/Chest: Effort normal and breath sounds normal. No respiratory distress. She has no wheezes. She has no rales.  Abdominal: Soft. Bowel sounds are normal. She exhibits no distension. There is no tenderness. There is no rebound and no guarding.  Musculoskeletal: She exhibits no edema.  Lymphadenopathy:    She has no cervical adenopathy.          Assessment & Plan:  1. Type II or unspecified type diabetes  mellitus without mention of complication, uncontrolled Increase Lantus to 45 units subcutaneous daily. When the patient's Lantus runs out I will switch her to [facility] as she complains of injection site soreness due to the Lantus.  I also recommended that she increase her aerobic exercise to 1520 minutes a day 5 days a week and try to lose 10 pounds. I believe her fatigue is likely due to deconditioning and lack of aerobic exercise. - COMPLETE METABOLIC PANEL WITH GFR - Hemoglobin A1c - Lipid panel - Microalbumin, urine  2. HLD (hyperlipidemia) Check fasting lipid panel. LDL is less than 100.  3. HTN (hypertension) Blood pressures well controlled continue current medications at the present dosages.

## 2014-02-01 ENCOUNTER — Telehealth: Payer: Self-pay | Admitting: Family Medicine

## 2014-02-01 LAB — MICROALBUMIN, URINE: MICROALB UR: 1.93 mg/dL — AB (ref 0.00–1.89)

## 2014-02-01 MED ORDER — VALSARTAN 160 MG PO TABS: 160.0000 mg | ORAL_TABLET | Freq: Every day | ORAL | Status: DC

## 2014-02-01 NOTE — Telephone Encounter (Signed)
Message copied by Olena Mater on Tue Feb 01, 2014 10:45 AM ------      Message from: Jenna Luo      Created: Tue Feb 01, 2014  7:23 AM       Protein is in her urine.  I would stop hctz, and replace with diovan 160 mg poqday to help protect her kidneys from diabetic damage.  Choletserol looks good.  A1c is 1 point too high.  We could add another pill, increase her insulin, or if she can start exercising 20 minutes a day (walking, etc), and lose 10 lbs, I feel she can get it down through lifestyle changes.   What option would she like to try? ------

## 2014-02-01 NOTE — Telephone Encounter (Signed)
Pt made aware of lab results.  Understands to stop HCTZ and new RX to pharmacy for Diovan 160 mg.  States is trying to loose weight.  Encouraged daily walks and 10 lb weight loss.  Needs to be seen again in 3 month to monitor progress.

## 2014-02-16 ENCOUNTER — Other Ambulatory Visit: Payer: Self-pay | Admitting: Family Medicine

## 2014-02-16 MED ORDER — INSULIN PEN NEEDLE 31G X 5 MM MISC: Status: DC

## 2014-02-16 NOTE — Telephone Encounter (Signed)
Rx Refilled  

## 2014-02-18 ENCOUNTER — Other Ambulatory Visit: Payer: Self-pay | Admitting: Family Medicine

## 2014-02-18 MED ORDER — INSULIN PEN NEEDLE 31G X 5 MM MISC: Status: DC

## 2014-03-01 ENCOUNTER — Other Ambulatory Visit: Payer: Self-pay | Admitting: Family Medicine

## 2014-03-01 MED ORDER — ALBUTEROL SULFATE HFA 108 (90 BASE) MCG/ACT IN AERS
2.0000 | INHALATION_SPRAY | Freq: Four times a day (QID) | RESPIRATORY_TRACT | Status: DC | PRN
Start: 2014-03-01 — End: 2020-11-16

## 2014-03-01 MED ORDER — ATORVASTATIN CALCIUM 40 MG PO TABS: 40.0000 mg | ORAL_TABLET | Freq: Every day | ORAL | Status: DC

## 2014-03-01 MED ORDER — INSULIN GLARGINE 100 UNIT/ML SOLOSTAR PEN: PEN_INJECTOR | SUBCUTANEOUS | Status: DC

## 2014-03-01 MED ORDER — AMLODIPINE BESYLATE 5 MG PO TABS: ORAL_TABLET | ORAL | Status: DC

## 2014-03-01 MED ORDER — METFORMIN HCL 1000 MG PO TABS: 1000.0000 mg | ORAL_TABLET | Freq: Two times a day (BID) | ORAL | Status: DC

## 2014-03-01 MED ORDER — VALSARTAN 160 MG PO TABS: 160.0000 mg | ORAL_TABLET | Freq: Every day | ORAL | Status: DC

## 2014-03-01 MED ORDER — NAPROXEN 500 MG PO TABS: ORAL_TABLET | ORAL | Status: DC

## 2014-03-01 NOTE — Telephone Encounter (Signed)
meds sent to pharm as requested by pt

## 2014-05-02 ENCOUNTER — Encounter: Payer: Self-pay | Admitting: Family Medicine

## 2014-05-02 ENCOUNTER — Ambulatory Visit (INDEPENDENT_AMBULATORY_CARE_PROVIDER_SITE_OTHER): Payer: Medicare HMO | Admitting: Family Medicine

## 2014-05-02 VITALS — BP 130/78 | HR 78 | Temp 97.1°F | Resp 18 | Ht 65.5 in | Wt 220.0 lb

## 2014-05-02 DIAGNOSIS — E1165 Type 2 diabetes mellitus with hyperglycemia: Principal | ICD-10-CM

## 2014-05-02 DIAGNOSIS — IMO0001 Reserved for inherently not codable concepts without codable children: Secondary | ICD-10-CM

## 2014-05-02 LAB — COMPLETE METABOLIC PANEL WITH GFR
ALBUMIN: 4 g/dL (ref 3.5–5.2)
ALT: 14 U/L (ref 0–35)
AST: 16 U/L (ref 0–37)
Alkaline Phosphatase: 70 U/L (ref 39–117)
BILIRUBIN TOTAL: 0.8 mg/dL (ref 0.2–1.2)
BUN: 12 mg/dL (ref 6–23)
CALCIUM: 9.6 mg/dL (ref 8.4–10.5)
CHLORIDE: 107 meq/L (ref 96–112)
CO2: 24 meq/L (ref 19–32)
Creat: 0.87 mg/dL (ref 0.50–1.10)
GFR, EST AFRICAN AMERICAN: 74 mL/min
GFR, EST NON AFRICAN AMERICAN: 64 mL/min
GLUCOSE: 112 mg/dL — AB (ref 70–99)
POTASSIUM: 4.1 meq/L (ref 3.5–5.3)
Sodium: 141 mEq/L (ref 135–145)
TOTAL PROTEIN: 6.4 g/dL (ref 6.0–8.3)

## 2014-05-02 LAB — LIPID PANEL
Cholesterol: 182 mg/dL (ref 0–200)
HDL: 61 mg/dL (ref >39)
LDL CALC: 90 mg/dL (ref 0–99)
Total CHOL/HDL Ratio: 3 Ratio
Triglycerides: 156 mg/dL — ABNORMAL HIGH (ref <150)
VLDL: 31 mg/dL (ref 0–40)

## 2014-05-02 LAB — HEMOGLOBIN A1C
HEMOGLOBIN A1C: 7.1 % — AB (ref <5.7)
Mean Plasma Glucose: 157 mg/dL — ABNORMAL HIGH (ref <117)

## 2014-05-02 NOTE — Progress Notes (Signed)
Subjective:    Patient ID: Amanda Riddle, female    DOB: 05/17/1935, 79 y.o.   MRN: [id number]  HPI 01/31/14 Patient is type 2 diabetes mellitus. She is currently on Lantus 40 units subcutaneous daily along with metformin. Her fasting blood sugars range 115 to 150. Her 2 postprandial sugars range 130 to 200. She denies any hypoglycemia. She has gained significant weight after she is retired. She is become relatively inactive. She is not exercising.  She denies any chest pain shortness of breath or dyspnea on exertion. She denies any myalgias or right upper quadrant pain on the Lipitor. She is taking aspirin 81 mg daily.  AT that time, my plan was: 1. Type II or unspecified type diabetes mellitus without mention of complication, uncontrolled Increase Lantus to 45 units subcutaneous daily. When the patient's Lantus runs out I will switch her to [facility] as she complains of injection site soreness due to the Lantus.  I also recommended that she increase her aerobic exercise to 15-20 minutes a day 5 days a week and try to lose 10 pounds. I believe her fatigue is likely due to deconditioning and lack of aerobic exercise. - COMPLETE METABOLIC PANEL WITH GFR - Hemoglobin A1c - Lipid panel - Microalbumin, urine  2. HLD (hyperlipidemia) Check fasting lipid panel. LDL is less than 100.  3. HTN (hypertension) Blood pressures well controlled continue current medications at the present dosages.  05/02/14 She is here today for recheck.  At her last office visit, she was found to have elevated microalbumin in the urine. I recommended switching her from hydrochlorothiazide to Diovan 160 mg by mouth daily. Her hemoglobin A1c was also elevated at 7.7. I recommended lifestyle changes including low carbohydrate diet, exercise, 15-20 pounds weight loss.  Patient denies any side effects today. Her fasting blood sugars are less than 130. Two-hour postprandial sugars are less than 200. She's not had any hypoglycemic  episodes. Her blood pressures well controlled 130/78. She's here today to recheck fasting lab work. Past Medical History  Diagnosis Date  . Hypertension   . Diabetes mellitus   . Hypercholesteremia    Current Outpatient Prescriptions on File Prior to Visit  Medication Sig Dispense Refill  . albuterol (PROVENTIL HFA;VENTOLIN HFA) 108 (90 BASE) MCG/ACT inhaler Inhale 2 puffs into the lungs every 6 (six) hours as needed. Shortness of breath  3 Inhaler  2  . amLODipine (NORVASC) 5 MG tablet TAKE 1 TABLET BY MOUTH EVERY DAY  90 tablet  3  . aspirin EC 81 MG tablet Take 81 mg by mouth at bedtime.      Marland Kitchen atorvastatin (LIPITOR) 40 MG tablet Take 1 tablet (40 mg total) by mouth daily.  90 tablet  3  . azithromycin (ZITHROMAX) 250 MG tablet 2 tabs poqday1, 1 tab poqday 2-5  6 tablet  0  . cholecalciferol (VITAMIN D) 1000 UNITS tablet Take 1,000 Units by mouth every morning.       . Cyanocobalamin (VITAMIN B 12 PO) Take 1 tablet by mouth at bedtime.       . Flaxseed, Linseed, (FLAXSEED OIL PO) Take 1 capsule by mouth 2 (two) times daily.       . Glucosamine Sulfate-MSM (MSM-GLUCOSAMINE PO) Take 1 capsule by mouth 2 (two) times daily.      . Insulin Glargine (LANTUS SOLOSTAR) 100 UNIT/ML Solostar Pen INJECT 40 UNITS INTO THE SKIN AT BEDTIME  15 pen  3  . insulin glargine (LANTUS) 100 units/mL SOLN Inject 40  Units into the skin at bedtime.       . Insulin Pen Needle (B-D UF III MINI PEN NEEDLES) 31G X 5 MM MISC CHECK BS QID (FOUR TIMES A DAY) Dx 250.00  300 each  4  . metFORMIN (GLUCOPHAGE) 1000 MG tablet Take 1 tablet (1,000 mg total) by mouth 2 (two) times daily with a meal.  180 tablet  3  . naproxen (NAPROSYN) 500 MG tablet TAKE 1 TABLET BY MOUTH TWICE A DAY WITH A MEAL  180 tablet  3  . ONE TOUCH ULTRA TEST test strip USE AS DIRECTED 4 TIMES A DAY PRE-MEAL AND AT BEDTIME  150 each  prn  . valsartan (DIOVAN) 160 MG tablet Take 1 tablet (160 mg total) by mouth daily.  90 tablet  3   No current  facility-administered medications on file prior to visit.   Allergies  Allergen Reactions  . Codeine Itching   History   Social History  . Marital Status: Divorced    Spouse Name: N/A    Number of Children: N/A  . Years of Education: N/A   Occupational History  . Not on file.   Social History Main Topics  . Smoking status: Never Smoker   . Smokeless tobacco: Current User    Types: Snuff  . Alcohol Use: No  . Drug Use: No  . Sexual Activity: Not on file   Other Topics Concern  . Not on file   Social History Narrative  . No narrative on file      Review of Systems  All other systems reviewed and are negative.      Objective:   Physical Exam  Vitals reviewed. Constitutional: She appears well-developed and well-nourished.  Neck: Neck supple. No JVD present. No thyromegaly present.  Cardiovascular: Normal rate, regular rhythm and normal heart sounds.   No murmur heard. Pulmonary/Chest: Effort normal and breath sounds normal. No respiratory distress. She has no wheezes. She has no rales.  Abdominal: Soft. Bowel sounds are normal. She exhibits no distension. There is no tenderness. There is no rebound and no guarding.  Musculoskeletal: She exhibits no edema.  Lymphadenopathy:    She has no cervical adenopathy.          Assessment & Plan:  1. Type II or unspecified type diabetes mellitus without mention of complication, uncontrolled Pressure is excellent. Check hemoglobin A1c as well as microalbumin. The hemoglobin A1c is less than 7. Goal LDL cholesterol is less than 100. Microalbumin is still elevated, increase Diovan to 320 mg by mouth daily. - COMPLETE METABOLIC PANEL WITH GFR - Lipid panel - Hemoglobin A1c - Microalbumin, urine

## 2014-05-03 LAB — MICROALBUMIN, URINE: MICROALB UR: 3.02 mg/dL — AB (ref 0.00–1.89)

## 2014-05-05 ENCOUNTER — Other Ambulatory Visit: Payer: Self-pay | Admitting: *Deleted

## 2014-05-05 MED ORDER — VALSARTAN 320 MG PO TABS: 320.0000 mg | ORAL_TABLET | Freq: Every day | ORAL | Status: DC

## 2014-05-14 ENCOUNTER — Telehealth: Payer: Self-pay | Admitting: *Deleted

## 2014-05-14 MED ORDER — GLUCOSE BLOOD VI STRP: ORAL_STRIP | Status: DC

## 2014-05-14 NOTE — Telephone Encounter (Signed)
Meds refilled.

## 2014-05-26 ENCOUNTER — Encounter: Payer: Self-pay | Admitting: Family Medicine

## 2014-05-26 ENCOUNTER — Ambulatory Visit (INDEPENDENT_AMBULATORY_CARE_PROVIDER_SITE_OTHER): Payer: Medicare HMO | Admitting: Family Medicine

## 2014-05-26 VITALS — BP 142/74 | HR 94 | Temp 97.8°F | Resp 18 | Ht 65.5 in | Wt 220.0 lb

## 2014-05-26 DIAGNOSIS — M25569 Pain in unspecified knee: Secondary | ICD-10-CM

## 2014-05-26 DIAGNOSIS — M25561 Pain in right knee: Secondary | ICD-10-CM

## 2014-05-26 MED ORDER — NAPROXEN 500 MG PO TABS: ORAL_TABLET | ORAL | Status: DC

## 2014-05-26 NOTE — Progress Notes (Signed)
Subjective:    Patient ID: Amanda Riddle, female    DOB: 1935-07-15, 79 y.o.   MRN: [id number]  HPI  Patient fell off her porch almost 2 weeks ago. She went to an urgent care where x-rays were obtained of her right knee and were found to be negative with no evidence of a fracture. However she continues to have pain in the posterior aspect of her right knee particularly lateral side. It hurts when she rales. Otherwise she is pain-free. She does have a mild effusion. There is no bruising or ecchymosis. After the fall she was unable to walk now she is able to ambulate with one crutch. This is steadily improving. She does report joint laxity. On examination today there is no laxity to varus or valgus stress. Patient has a negative anterior and posterior drawer sign. However she has a positive Apley grind test in the posterior lateral aspect of her knee. Past Medical History  Diagnosis Date  . Hypertension   . Diabetes mellitus   . Hypercholesteremia    Past Surgical History  Procedure Laterality Date  . Abdominal hysterectomy  1986  . Tonsillectomy  1950   Current Outpatient Prescriptions on File Prior to Visit  Medication Sig Dispense Refill  . albuterol (PROVENTIL HFA;VENTOLIN HFA) 108 (90 BASE) MCG/ACT inhaler Inhale 2 puffs into the lungs every 6 (six) hours as needed. Shortness of breath  3 Inhaler  2  . amLODipine (NORVASC) 5 MG tablet TAKE 1 TABLET BY MOUTH EVERY DAY  90 tablet  3  . aspirin EC 81 MG tablet Take 81 mg by mouth at bedtime.      Marland Kitchen atorvastatin (LIPITOR) 40 MG tablet Take 1 tablet (40 mg total) by mouth daily.  90 tablet  3  . cholecalciferol (VITAMIN D) 1000 UNITS tablet Take 1,000 Units by mouth every morning.       . Cyanocobalamin (VITAMIN B 12 PO) Take 1 tablet by mouth at bedtime.       . Flaxseed, Linseed, (FLAXSEED OIL PO) Take 1 capsule by mouth 2 (two) times daily.       . Glucosamine Sulfate-MSM (MSM-GLUCOSAMINE PO) Take 1 capsule by mouth 2 (two) times daily.       Marland Kitchen glucose blood (ONE TOUCH ULTRA TEST) test strip USE AS DIRECTED 4 TIMES A DAY PRE-MEAL AND AT BEDTIME  150 each  prn  . Insulin Glargine (LANTUS) 100 UNIT/ML Solostar Pen Inject 45 Units into the skin every morning. INJECT 40 UNITS INTO THE SKIN AT BEDTIME      . insulin glargine (LANTUS) 100 units/mL SOLN Inject 40 Units into the skin at bedtime.       . Insulin Pen Needle (B-D UF III MINI PEN NEEDLES) 31G X 5 MM MISC CHECK BS QID (FOUR TIMES A DAY) Dx 250.00  300 each  4  . metFORMIN (GLUCOPHAGE) 1000 MG tablet Take 1 tablet (1,000 mg total) by mouth 2 (two) times daily with a meal.  180 tablet  3  . valsartan (DIOVAN) 320 MG tablet Take 1 tablet (320 mg total) by mouth daily.  90 tablet  3   No current facility-administered medications on file prior to visit.   Allergies  Allergen Reactions  . Codeine Itching   History   Social History  . Marital Status: Divorced    Spouse Name: N/A    Number of Children: N/A  . Years of Education: N/A   Occupational History  . Not on file.  Social History Main Topics  . Smoking status: Never Smoker   . Smokeless tobacco: Current User    Types: Snuff  . Alcohol Use: No  . Drug Use: No  . Sexual Activity: Not on file   Other Topics Concern  . Not on file   Social History Narrative  . No narrative on file      Review of Systems  All other systems reviewed and are negative.      Objective:   Physical Exam  Vitals reviewed. Cardiovascular: Normal rate and regular rhythm.   Pulmonary/Chest: Effort normal and breath sounds normal.  Musculoskeletal:       Right knee: She exhibits decreased range of motion, effusion and abnormal meniscus. She exhibits no erythema, normal alignment, no LCL laxity and no MCL laxity. No medial joint line, no lateral joint line, no MCL and no LCL tenderness noted.          Assessment & Plan:  1. Posterior right knee pain I am concerned patient may have a meniscal tear. I recommended  clinical monitoring for the next one to 2 weeks. I recommended rest, ice, elevation. Recommended weightbearing as tolerated. I recommended Naprosyn 500 mg by mouth twice a day. Recheck in 1 week if no better.

## 2014-06-08 ENCOUNTER — Other Ambulatory Visit: Payer: Self-pay | Admitting: Family Medicine

## 2014-06-08 ENCOUNTER — Telehealth: Payer: Self-pay | Admitting: Family Medicine

## 2014-06-08 MED ORDER — VALSARTAN 320 MG PO TABS: 320.0000 mg | ORAL_TABLET | Freq: Every day | ORAL | Status: DC

## 2014-06-08 NOTE — Telephone Encounter (Signed)
[phone]  Pt is needing a refill on valsartan (DIOVAN) 320 MG tablet ( she is completely out of the medication)  [place]   Pt knee is were she can walk pretty good (go up and down hall without crutches)

## 2014-06-08 NOTE — Telephone Encounter (Signed)
Med sent to pharm and note routed to [facility] for [facility]

## 2014-07-05 ENCOUNTER — Ambulatory Visit (INDEPENDENT_AMBULATORY_CARE_PROVIDER_SITE_OTHER): Payer: Medicare HMO | Admitting: *Deleted

## 2014-07-05 DIAGNOSIS — Z23 Encounter for immunization: Secondary | ICD-10-CM

## 2014-07-07 ENCOUNTER — Encounter: Payer: Self-pay | Admitting: Family Medicine

## 2014-07-07 ENCOUNTER — Ambulatory Visit (INDEPENDENT_AMBULATORY_CARE_PROVIDER_SITE_OTHER): Payer: Medicare HMO | Admitting: Family Medicine

## 2014-07-07 VITALS — BP 142/64 | HR 98 | Temp 98.1°F | Resp 18 | Ht 65.5 in | Wt 222.0 lb

## 2014-07-07 DIAGNOSIS — B029 Zoster without complications: Secondary | ICD-10-CM

## 2014-07-07 MED ORDER — VALACYCLOVIR HCL 1 G PO TABS: 1000.0000 mg | ORAL_TABLET | Freq: Three times a day (TID) | ORAL | Status: DC

## 2014-07-07 MED ORDER — HYDROCODONE-ACETAMINOPHEN 10-325 MG PO TABS: 1.0000 | ORAL_TABLET | Freq: Three times a day (TID) | ORAL | Status: DC | PRN

## 2014-07-07 NOTE — Progress Notes (Signed)
Subjective:    Patient ID: Amanda Riddle, female    DOB: 26-Oct-1934, 79 y.o.   MRN: [id number]  HPI  Patient is a very pleasant 79 year old African American female who developed a painful rash on the back of her neck yesterday. The rash consists of a cluster of erythematous blisters and papules which are arranged in a linear distribution. It originates in the center of her neck and radiates down toward her left shoulder. It has the characteristic appearance of shingles. Past Medical History  Diagnosis Date  . Hypertension   . Diabetes mellitus   . Hypercholesteremia    Current Outpatient Prescriptions on File Prior to Visit  Medication Sig Dispense Refill  . albuterol (PROVENTIL HFA;VENTOLIN HFA) 108 (90 BASE) MCG/ACT inhaler Inhale 2 puffs into the lungs every 6 (six) hours as needed. Shortness of breath  3 Inhaler  2  . amLODipine (NORVASC) 5 MG tablet TAKE 1 TABLET BY MOUTH EVERY DAY  90 tablet  3  . aspirin EC 81 MG tablet Take 81 mg by mouth at bedtime.      Marland Kitchen atorvastatin (LIPITOR) 40 MG tablet Take 1 tablet (40 mg total) by mouth daily.  90 tablet  3  . cholecalciferol (VITAMIN D) 1000 UNITS tablet Take 1,000 Units by mouth every morning.       . Cyanocobalamin (VITAMIN B 12 PO) Take 1 tablet by mouth at bedtime.       . Flaxseed, Linseed, (FLAXSEED OIL PO) Take 1 capsule by mouth 2 (two) times daily.       . Glucosamine Sulfate-MSM (MSM-GLUCOSAMINE PO) Take 1 capsule by mouth 2 (two) times daily.      Marland Kitchen glucose blood (ONE TOUCH ULTRA TEST) test strip USE AS DIRECTED 4 TIMES A DAY PRE-MEAL AND AT BEDTIME  150 each  prn  . Insulin Glargine (LANTUS) 100 UNIT/ML Solostar Pen Inject 45 Units into the skin every morning. INJECT 40 UNITS INTO THE SKIN AT BEDTIME      . insulin glargine (LANTUS) 100 units/mL SOLN Inject 40 Units into the skin at bedtime.       . Insulin Pen Needle (B-D UF III MINI PEN NEEDLES) 31G X 5 MM MISC CHECK BS QID (FOUR TIMES A DAY) Dx 250.00  300 each  4  .  metFORMIN (GLUCOPHAGE) 1000 MG tablet Take 1 tablet (1,000 mg total) by mouth 2 (two) times daily with a meal.  180 tablet  3  . naproxen (NAPROSYN) 500 MG tablet TAKE 1 TABLET BY MOUTH TWICE A DAY WITH A MEAL  180 tablet  3  . valsartan (DIOVAN) 320 MG tablet Take 1 tablet (320 mg total) by mouth daily.  90 tablet  3   No current facility-administered medications on file prior to visit.   Allergies  Allergen Reactions  . Codeine Itching   History   Social History  . Marital Status: Divorced    Spouse Name: N/A    Number of Children: N/A  . Years of Education: N/A   Occupational History  . Not on file.   Social History Main Topics  . Smoking status: Never Smoker   . Smokeless tobacco: Current User    Types: Snuff  . Alcohol Use: No  . Drug Use: No  . Sexual Activity: Not on file   Other Topics Concern  . Not on file   Social History Narrative  . No narrative on file     Review of Systems  All other systems  reviewed and are negative.      Objective:   Physical Exam  Vitals reviewed. Constitutional: She appears well-developed and well-nourished.  Cardiovascular: Normal rate, regular rhythm and normal heart sounds.   Pulmonary/Chest: Effort normal and breath sounds normal. No respiratory distress. She has no wheezes. She has no rales.  Skin: Rash noted. There is erythema.          Assessment & Plan:  Shingles - Plan: valACYclovir (VALTREX) 1000 MG tablet, HYDROcodone-acetaminophen (NORCO) 10-325 MG per tablet  Begin Valtrex 1000 mg by mouth 3 times a day for 7 days. Also gave patient prescription for Norco 10/325 one half a tablet to one whole tab every 8 hours as needed for pain. Recheck in one week if no better or immediately if worse.

## 2014-09-13 ENCOUNTER — Encounter: Payer: Self-pay | Admitting: Family Medicine

## 2014-09-13 ENCOUNTER — Ambulatory Visit (INDEPENDENT_AMBULATORY_CARE_PROVIDER_SITE_OTHER): Payer: Medicare HMO | Admitting: Family Medicine

## 2014-09-13 VITALS — BP 130/74 | HR 80 | Temp 98.2°F | Resp 16 | Ht 65.5 in | Wt 225.0 lb

## 2014-09-13 DIAGNOSIS — I10 Essential (primary) hypertension: Secondary | ICD-10-CM

## 2014-09-13 DIAGNOSIS — E785 Hyperlipidemia, unspecified: Secondary | ICD-10-CM

## 2014-09-13 DIAGNOSIS — L259 Unspecified contact dermatitis, unspecified cause: Secondary | ICD-10-CM

## 2014-09-13 DIAGNOSIS — E119 Type 2 diabetes mellitus without complications: Secondary | ICD-10-CM

## 2014-09-13 LAB — HEMOGLOBIN A1C
Hgb A1c MFr Bld: 7.4 % — ABNORMAL HIGH (ref <5.7)
MEAN PLASMA GLUCOSE: 166 mg/dL — AB (ref <117)

## 2014-09-13 LAB — COMPLETE METABOLIC PANEL WITH GFR
ALT: 11 U/L (ref 0–35)
AST: 13 U/L (ref 0–37)
Albumin: 4.1 g/dL (ref 3.5–5.2)
Alkaline Phosphatase: 85 U/L (ref 39–117)
BUN: 8 mg/dL (ref 6–23)
CO2: 24 mEq/L (ref 19–32)
Calcium: 9.3 mg/dL (ref 8.4–10.5)
Chloride: 106 mEq/L (ref 96–112)
Creat: 0.87 mg/dL (ref 0.50–1.10)
GFR, Est African American: 74 mL/min
GFR, Est Non African American: 64 mL/min
Glucose, Bld: 123 mg/dL — ABNORMAL HIGH (ref 70–99)
Potassium: 4.3 mEq/L (ref 3.5–5.3)
SODIUM: 142 meq/L (ref 135–145)
TOTAL PROTEIN: 6.5 g/dL (ref 6.0–8.3)
Total Bilirubin: 0.9 mg/dL (ref 0.2–1.2)

## 2014-09-13 LAB — LIPID PANEL
CHOLESTEROL: 167 mg/dL (ref 0–200)
HDL: 51 mg/dL (ref >39)
LDL Cholesterol: 83 mg/dL (ref 0–99)
Total CHOL/HDL Ratio: 3.3 Ratio
Triglycerides: 163 mg/dL — ABNORMAL HIGH (ref <150)
VLDL: 33 mg/dL (ref 0–40)

## 2014-09-13 LAB — MICROALBUMIN, URINE: Microalb, Ur: 4 mg/dL — ABNORMAL HIGH (ref <2.0)

## 2014-09-13 MED ORDER — INSULIN NPH ISOPHANE & REGULAR (70-30) 100 UNIT/ML ~~LOC~~ SUSP: SUBCUTANEOUS | Status: DC

## 2014-09-13 MED ORDER — MOMETASONE FUROATE 0.1 % EX OINT: TOPICAL_OINTMENT | Freq: Every day | CUTANEOUS | Status: DC

## 2014-09-13 NOTE — Progress Notes (Signed)
Subjective:    Patient ID: Amanda Riddle, female    DOB: 10-08-1935, 79 y.o.   MRN: [id number]  HPI  Patient is here today for follow-up of her diabetes, hypertension, and hyperlipidemia. She is currently on Lantus 45 units subcutaneous daily. Her fasting blood sugars typically range between 70 and 140 with average much less than 120. She is having no hypoglycemic episodes. She denies any polyuria, polydipsia, or blurred vision. Her biggest concern is the cost of the Lantus. She is having to pay over $500 per month for this medication. She denies any chest pain shortness of breath or dyspnea on exertion. Her blood pressure is well controlled today 130/74. She denies any myalgias or right upper quadrant pain. Her diabetic foot exam is up-to-date. Past Medical History  Diagnosis Date  . Hypertension   . Diabetes mellitus   . Hypercholesteremia    Past Surgical History  Procedure Laterality Date  . Abdominal hysterectomy  1986  . Tonsillectomy  1950   Current Outpatient Prescriptions on File Prior to Visit  Medication Sig Dispense Refill  . albuterol (PROVENTIL HFA;VENTOLIN HFA) 108 (90 BASE) MCG/ACT inhaler Inhale 2 puffs into the lungs every 6 (six) hours as needed. Shortness of breath 3 Inhaler 2  . amLODipine (NORVASC) 5 MG tablet TAKE 1 TABLET BY MOUTH EVERY DAY 90 tablet 3  . aspirin EC 81 MG tablet Take 81 mg by mouth at bedtime.    Marland Kitchen atorvastatin (LIPITOR) 40 MG tablet Take 1 tablet (40 mg total) by mouth daily. 90 tablet 3  . cholecalciferol (VITAMIN D) 1000 UNITS tablet Take 1,000 Units by mouth every morning.     . Cyanocobalamin (VITAMIN B 12 PO) Take 1 tablet by mouth at bedtime.     . Flaxseed, Linseed, (FLAXSEED OIL PO) Take 1 capsule by mouth 2 (two) times daily.     . Glucosamine Sulfate-MSM (MSM-GLUCOSAMINE PO) Take 1 capsule by mouth 2 (two) times daily.    Marland Kitchen glucose blood (ONE TOUCH ULTRA TEST) test strip USE AS DIRECTED 4 TIMES A DAY PRE-MEAL AND AT BEDTIME 150 each  prn  . Insulin Glargine (LANTUS) 100 UNIT/ML Solostar Pen Inject 45 Units into the skin every morning. INJECT 40 UNITS INTO THE SKIN AT BEDTIME    . insulin glargine (LANTUS) 100 units/mL SOLN Inject 40 Units into the skin at bedtime.     . Insulin Pen Needle (B-D UF III MINI PEN NEEDLES) 31G X 5 MM MISC CHECK BS QID (FOUR TIMES A DAY) Dx 250.00 300 each 4  . metFORMIN (GLUCOPHAGE) 1000 MG tablet Take 1 tablet (1,000 mg total) by mouth 2 (two) times daily with a meal. 180 tablet 3  . naproxen (NAPROSYN) 500 MG tablet TAKE 1 TABLET BY MOUTH TWICE A DAY WITH A MEAL 180 tablet 3  . valACYclovir (VALTREX) 1000 MG tablet Take 1 tablet (1,000 mg total) by mouth 3 (three) times daily. 21 tablet 0  . valsartan (DIOVAN) 320 MG tablet Take 1 tablet (320 mg total) by mouth daily. 90 tablet 3   No current facility-administered medications on file prior to visit.   Allergies  Allergen Reactions  . Codeine Itching   History   Social History  . Marital Status: Divorced    Spouse Name: N/A    Number of Children: N/A  . Years of Education: N/A   Occupational History  . Not on file.   Social History Main Topics  . Smoking status: Never Smoker   .  Smokeless tobacco: Current User    Types: Snuff  . Alcohol Use: No  . Drug Use: No  . Sexual Activity: Not on file   Other Topics Concern  . Not on file   Social History Narrative     Review of Systems  All other systems reviewed and are negative.      Objective:   Physical Exam  Constitutional: She appears well-developed and well-nourished.  Cardiovascular: Normal rate, regular rhythm, normal heart sounds and intact distal pulses.  Exam reveals no gallop and no friction rub.   No murmur heard. Pulmonary/Chest: Effort normal and breath sounds normal. No respiratory distress. She has no wheezes. She has no rales.  Abdominal: Soft. Bowel sounds are normal. She exhibits no distension. There is no tenderness. There is no rebound and no  guarding.  Musculoskeletal: She exhibits no edema.  Vitals reviewed.         Assessment & Plan:  HLD (hyperlipidemia)  Essential hypertension  Diabetes mellitus type II, controlled - Plan: COMPLETE METABOLIC PANEL WITH GFR, Lipid panel, Hemoglobin A1c, Microalbumin, urine  Contact dermatitis - Plan: mometasone (ELOCON) 0.1 % ointment  Patient's blood pressure is excellent I will make no changes in her blood pressure medication. I will check a hemoglobin A1c and a urine microalbumin. At her last office visit her urine microalbumin was elevated and therefore I increased her valsartan. I to recheck that today. I will also check a fasting lipid panel. Her goal LDL cholesterol is less than 70. Patient's diabetes seems well controlled but for cost I will switch the patient to Novolin 70/30 30 units in the morning and 15 units in the afternoon. Patient's vaccinations are up-to-date. She does have an allergic rash contact dermatitis on her right wrist from where she was working in her garden. I gave the patient a prescription for Elocon ointment to be applied once daily for 10 days to cure this rash.

## 2014-09-22 ENCOUNTER — Encounter: Payer: Self-pay | Admitting: Family Medicine

## 2014-11-22 ENCOUNTER — Other Ambulatory Visit: Payer: Self-pay | Admitting: Family Medicine

## 2014-11-22 DIAGNOSIS — Z1231 Encounter for screening mammogram for malignant neoplasm of breast: Secondary | ICD-10-CM

## 2014-12-07 ENCOUNTER — Ambulatory Visit (HOSPITAL_COMMUNITY)
Admission: RE | Admit: 2014-12-07 | Discharge: 2014-12-07 | Disposition: A | Discharge status: Home / Self Care | Payer: Commercial Managed Care - HMO | Source: Ambulatory Visit | Attending: Family Medicine | Admitting: Family Medicine

## 2014-12-07 DIAGNOSIS — Z1231 Encounter for screening mammogram for malignant neoplasm of breast: Secondary | ICD-10-CM

## 2014-12-23 ENCOUNTER — Other Ambulatory Visit: Payer: Self-pay | Admitting: Family Medicine

## 2014-12-23 NOTE — Telephone Encounter (Signed)
Refill appropriate and filled per protocol. 

## 2015-01-30 ENCOUNTER — Ambulatory Visit (INDEPENDENT_AMBULATORY_CARE_PROVIDER_SITE_OTHER): Payer: Commercial Managed Care - HMO | Admitting: Family Medicine

## 2015-01-30 ENCOUNTER — Encounter: Payer: Self-pay | Admitting: Family Medicine

## 2015-01-30 VITALS — BP 128/80 | HR 72 | Temp 97.6°F | Resp 16 | Ht 65.5 in | Wt 218.0 lb

## 2015-01-30 DIAGNOSIS — E785 Hyperlipidemia, unspecified: Secondary | ICD-10-CM

## 2015-01-30 DIAGNOSIS — Z794 Long term (current) use of insulin: Secondary | ICD-10-CM | POA: Diagnosis not present

## 2015-01-30 DIAGNOSIS — E119 Type 2 diabetes mellitus without complications: Secondary | ICD-10-CM | POA: Diagnosis not present

## 2015-01-30 DIAGNOSIS — I10 Essential (primary) hypertension: Secondary | ICD-10-CM

## 2015-01-30 LAB — CBC WITH DIFFERENTIAL/PLATELET
Basophils Absolute: 0 10*3/uL (ref 0.0–0.1)
Basophils Relative: 0 % (ref 0–1)
Eosinophils Absolute: 0.1 10*3/uL (ref 0.0–0.7)
Eosinophils Relative: 1 % (ref 0–5)
HEMATOCRIT: 40 % (ref 36.0–46.0)
HEMOGLOBIN: 13.1 g/dL (ref 12.0–15.0)
LYMPHS ABS: 2.5 10*3/uL (ref 0.7–4.0)
Lymphocytes Relative: 45 % (ref 12–46)
MCH: 28.6 pg (ref 26.0–34.0)
MCHC: 32.8 g/dL (ref 30.0–36.0)
MCV: 87.3 fL (ref 78.0–100.0)
MPV: 9.3 fL (ref 8.6–12.4)
Monocytes Absolute: 0.3 10*3/uL (ref 0.1–1.0)
Monocytes Relative: 6 % (ref 3–12)
NEUTROS ABS: 2.6 10*3/uL (ref 1.7–7.7)
NEUTROS PCT: 48 % (ref 43–77)
Platelets: 280 10*3/uL (ref 150–400)
RBC: 4.58 MIL/uL (ref 3.87–5.11)
RDW: 15.1 % (ref 11.5–15.5)
WBC: 5.5 10*3/uL (ref 4.0–10.5)

## 2015-01-30 LAB — LIPID PANEL
CHOL/HDL RATIO: 3.4 ratio
CHOLESTEROL: 189 mg/dL (ref 0–200)
HDL: 56 mg/dL (ref >=46)
LDL Cholesterol: 93 mg/dL (ref 0–99)
TRIGLYCERIDES: 201 mg/dL — AB (ref <150)
VLDL: 40 mg/dL (ref 0–40)

## 2015-01-30 LAB — COMPLETE METABOLIC PANEL WITH GFR
ALBUMIN: 4.2 g/dL (ref 3.5–5.2)
ALT: 13 U/L (ref 0–35)
AST: 13 U/L (ref 0–37)
Alkaline Phosphatase: 85 U/L (ref 39–117)
BUN: 11 mg/dL (ref 6–23)
CALCIUM: 9.4 mg/dL (ref 8.4–10.5)
CHLORIDE: 107 meq/L (ref 96–112)
CO2: 24 mEq/L (ref 19–32)
Creat: 0.89 mg/dL (ref 0.50–1.10)
GFR, EST NON AFRICAN AMERICAN: 63 mL/min
GFR, Est African American: 72 mL/min
Glucose, Bld: 101 mg/dL — ABNORMAL HIGH (ref 70–99)
Potassium: 4.5 mEq/L (ref 3.5–5.3)
Sodium: 144 mEq/L (ref 135–145)
TOTAL PROTEIN: 6.5 g/dL (ref 6.0–8.3)
Total Bilirubin: 1.1 mg/dL (ref 0.2–1.2)

## 2015-01-30 NOTE — Progress Notes (Signed)
Subjective:    Patient ID: Amanda Riddle, female    DOB: Oct 31, 1934, 80 y.o.   MRN: [id number]  HPI Patient is a very pleasant 80 year old African-American female who is here today for follow-up of her medical problems. She has insulin-dependent diabetes mellitus. She is currently on a combination of Lantus 45 units in the morning and metformin 1000 mg by mouth twice a day. Her fasting blood sugars typically range between 80 and 150 in the mornings. The vast majority are under 120. She denies any hypoglycemia. She denies any chest pain or shortness of breath or dyspnea with exertion. She does occasionally have some chest pain at night but gets better when she sits up. This chest pain only occurs after she eats salads. There is definite dietary component to it and a positional component to it.   Her blood pressure is excellent today 128/80. She is scheduled to see her eye doctor later this month for her annual eye exam. She schedules that. Her diabetic foot exam is performed today and is normal. She also has hyperlipidemia and is currently on Lipitor 40 mg by mouth daily. She denies any myalgias or right upper quadrant pain. Mammogram was performed earlier this year. Colonoscopy was performed in 2014 and is not due again until 2019. Past Medical History  Diagnosis Date  . Hypertension   . Diabetes mellitus   . Hypercholesteremia    Past Surgical History  Procedure Laterality Date  . Abdominal hysterectomy  1986  . Tonsillectomy  1950   Current Outpatient Prescriptions on File Prior to Visit  Medication Sig Dispense Refill  . albuterol (PROVENTIL HFA;VENTOLIN HFA) 108 (90 BASE) MCG/ACT inhaler Inhale 2 puffs into the lungs every 6 (six) hours as needed. Shortness of breath 3 Inhaler 2  . amLODipine (NORVASC) 5 MG tablet TAKE 1 TABLET EVERY DAY 90 tablet 3  . aspirin EC 81 MG tablet Take 81 mg by mouth at bedtime.    Marland Kitchen atorvastatin (LIPITOR) 40 MG tablet TAKE 1 TABLET EVERY DAY 90 tablet 3  .  cholecalciferol (VITAMIN D) 1000 UNITS tablet Take 1,000 Units by mouth every morning.     . Cyanocobalamin (VITAMIN B 12 PO) Take 1 tablet by mouth at bedtime.     . Flaxseed, Linseed, (FLAXSEED OIL PO) Take 1 capsule by mouth 2 (two) times daily.     . Glucosamine Sulfate-MSM (MSM-GLUCOSAMINE PO) Take 1 capsule by mouth 2 (two) times daily.    Marland Kitchen glucose blood (ONE TOUCH ULTRA TEST) test strip USE AS DIRECTED 4 TIMES A DAY PRE-MEAL AND AT BEDTIME 150 each prn  . Insulin Glargine (LANTUS) 100 UNIT/ML Solostar Pen Inject 45 Units into the skin every morning. INJECT 45 UNITS INTO THE SKIN qam    . Insulin Pen Needle (B-D UF III MINI PEN NEEDLES) 31G X 5 MM MISC CHECK BS QID (FOUR TIMES A DAY) Dx 250.00 300 each 4  . metFORMIN (GLUCOPHAGE) 1000 MG tablet TAKE 1 TABLET TWICE DAILY 180 tablet 3  . mometasone (ELOCON) 0.1 % ointment Apply topically daily. 45 g 0  . naproxen (NAPROSYN) 500 MG tablet TAKE 1 TABLET BY MOUTH TWICE A DAY WITH A MEAL 180 tablet 3  . valACYclovir (VALTREX) 1000 MG tablet Take 1 tablet (1,000 mg total) by mouth 3 (three) times daily. 21 tablet 0  . valsartan (DIOVAN) 320 MG tablet Take 1 tablet (320 mg total) by mouth daily. 90 tablet 3   No current facility-administered medications on file prior  to visit.   Allergies  Allergen Reactions  . Codeine Itching   History   Social History  . Marital Status: Divorced    Spouse Name: N/A  . Number of Children: N/A  . Years of Education: N/A   Occupational History  . Not on file.   Social History Main Topics  . Smoking status: Never Smoker   . Smokeless tobacco: Current User    Types: Snuff  . Alcohol Use: No  . Drug Use: No  . Sexual Activity: Not on file   Other Topics Concern  . Not on file   Social History Narrative       Review of Systems  All other systems reviewed and are negative.      Objective:   Physical Exam  Constitutional: She appears well-developed and well-nourished.  Neck: Neck  supple. No JVD present. No thyromegaly present.  Cardiovascular: Normal rate, regular rhythm, normal heart sounds and intact distal pulses.   No murmur heard. Pulmonary/Chest: Effort normal and breath sounds normal. No respiratory distress. She has no wheezes. She has no rales. She exhibits no tenderness.  Abdominal: Soft. Bowel sounds are normal. She exhibits no distension and no mass. There is no tenderness. There is no rebound and no guarding.  Musculoskeletal: She exhibits no edema.  Lymphadenopathy:    She has no cervical adenopathy.  Vitals reviewed.         Assessment & Plan:  Diabetes mellitus, type II, insulin dependent - Plan: COMPLETE METABOLIC PANEL WITH GFR, Lipid panel, Hemoglobin A1c, Microalbumin, urine, CBC with Differential/Platelet  HLD (hyperlipidemia)  Essential hypertension  Patient's random blood sugars look excellent. Her blood pressure looks excellent. She is taking an aspirin. She has lost 7 pounds since her last office visit. Her diabetic foot exam and diabetic eye exam are up-to-date. She received Pneumovax 23 today in clinic. Her mammogram and colonoscopy are up-to-date. I will check a fasting lipid panel. Goal LDL cholesterol is less than 100. Currently her blood pressures at goal. I will check a hemoglobin A1c as well as a urine microalbumin. Goal hemoglobin A1c is less than 7.0

## 2015-01-31 LAB — HEMOGLOBIN A1C
Hgb A1c MFr Bld: 8 % — ABNORMAL HIGH (ref <5.7)
Mean Plasma Glucose: 183 mg/dL — ABNORMAL HIGH (ref <117)

## 2015-01-31 LAB — MICROALBUMIN, URINE: Microalb, Ur: 5.9 mg/dL — ABNORMAL HIGH (ref <2.0)

## 2015-02-03 ENCOUNTER — Other Ambulatory Visit: Payer: Self-pay | Admitting: *Deleted

## 2015-02-03 MED ORDER — SITAGLIPTIN PHOSPHATE 100 MG PO TABS: 100.0000 mg | ORAL_TABLET | Freq: Every day | ORAL | Status: DC

## 2015-02-24 DIAGNOSIS — M9904 Segmental and somatic dysfunction of sacral region: Secondary | ICD-10-CM | POA: Diagnosis not present

## 2015-02-24 DIAGNOSIS — M5489 Other dorsalgia: Secondary | ICD-10-CM | POA: Diagnosis not present

## 2015-02-24 DIAGNOSIS — M4327 Fusion of spine, lumbosacral region: Secondary | ICD-10-CM | POA: Diagnosis not present

## 2015-02-24 DIAGNOSIS — M9903 Segmental and somatic dysfunction of lumbar region: Secondary | ICD-10-CM | POA: Diagnosis not present

## 2015-03-23 DIAGNOSIS — M5489 Other dorsalgia: Secondary | ICD-10-CM | POA: Diagnosis not present

## 2015-03-23 DIAGNOSIS — M4327 Fusion of spine, lumbosacral region: Secondary | ICD-10-CM | POA: Diagnosis not present

## 2015-03-23 DIAGNOSIS — M9903 Segmental and somatic dysfunction of lumbar region: Secondary | ICD-10-CM | POA: Diagnosis not present

## 2015-03-23 DIAGNOSIS — M9904 Segmental and somatic dysfunction of sacral region: Secondary | ICD-10-CM | POA: Diagnosis not present

## 2015-03-24 DIAGNOSIS — M4327 Fusion of spine, lumbosacral region: Secondary | ICD-10-CM | POA: Diagnosis not present

## 2015-03-24 DIAGNOSIS — M9903 Segmental and somatic dysfunction of lumbar region: Secondary | ICD-10-CM | POA: Diagnosis not present

## 2015-03-24 DIAGNOSIS — M5489 Other dorsalgia: Secondary | ICD-10-CM | POA: Diagnosis not present

## 2015-03-24 DIAGNOSIS — M9904 Segmental and somatic dysfunction of sacral region: Secondary | ICD-10-CM | POA: Diagnosis not present

## 2015-03-29 DIAGNOSIS — M4327 Fusion of spine, lumbosacral region: Secondary | ICD-10-CM | POA: Diagnosis not present

## 2015-03-29 DIAGNOSIS — M9903 Segmental and somatic dysfunction of lumbar region: Secondary | ICD-10-CM | POA: Diagnosis not present

## 2015-03-29 DIAGNOSIS — M5489 Other dorsalgia: Secondary | ICD-10-CM | POA: Diagnosis not present

## 2015-03-29 DIAGNOSIS — M9904 Segmental and somatic dysfunction of sacral region: Secondary | ICD-10-CM | POA: Diagnosis not present

## 2015-03-31 DIAGNOSIS — M9904 Segmental and somatic dysfunction of sacral region: Secondary | ICD-10-CM | POA: Diagnosis not present

## 2015-03-31 DIAGNOSIS — M5489 Other dorsalgia: Secondary | ICD-10-CM | POA: Diagnosis not present

## 2015-03-31 DIAGNOSIS — M9903 Segmental and somatic dysfunction of lumbar region: Secondary | ICD-10-CM | POA: Diagnosis not present

## 2015-03-31 DIAGNOSIS — M4327 Fusion of spine, lumbosacral region: Secondary | ICD-10-CM | POA: Diagnosis not present

## 2015-04-03 DIAGNOSIS — M4327 Fusion of spine, lumbosacral region: Secondary | ICD-10-CM | POA: Diagnosis not present

## 2015-04-03 DIAGNOSIS — M9904 Segmental and somatic dysfunction of sacral region: Secondary | ICD-10-CM | POA: Diagnosis not present

## 2015-04-03 DIAGNOSIS — M9903 Segmental and somatic dysfunction of lumbar region: Secondary | ICD-10-CM | POA: Diagnosis not present

## 2015-04-03 DIAGNOSIS — M5489 Other dorsalgia: Secondary | ICD-10-CM | POA: Diagnosis not present

## 2015-04-04 DIAGNOSIS — H5210 Myopia, unspecified eye: Secondary | ICD-10-CM | POA: Diagnosis not present

## 2015-04-04 DIAGNOSIS — H25811 Combined forms of age-related cataract, right eye: Secondary | ICD-10-CM | POA: Diagnosis not present

## 2015-04-04 DIAGNOSIS — E109 Type 1 diabetes mellitus without complications: Secondary | ICD-10-CM | POA: Diagnosis not present

## 2015-04-04 DIAGNOSIS — I10 Essential (primary) hypertension: Secondary | ICD-10-CM | POA: Diagnosis not present

## 2015-04-04 DIAGNOSIS — H5200 Hypermetropia, unspecified eye: Secondary | ICD-10-CM | POA: Diagnosis not present

## 2015-04-04 DIAGNOSIS — H2510 Age-related nuclear cataract, unspecified eye: Secondary | ICD-10-CM | POA: Diagnosis not present

## 2015-04-04 LAB — HM DIABETES EYE EXAM

## 2015-04-05 DIAGNOSIS — M9903 Segmental and somatic dysfunction of lumbar region: Secondary | ICD-10-CM | POA: Diagnosis not present

## 2015-04-05 DIAGNOSIS — M5489 Other dorsalgia: Secondary | ICD-10-CM | POA: Diagnosis not present

## 2015-04-05 DIAGNOSIS — M9904 Segmental and somatic dysfunction of sacral region: Secondary | ICD-10-CM | POA: Diagnosis not present

## 2015-04-05 DIAGNOSIS — M4327 Fusion of spine, lumbosacral region: Secondary | ICD-10-CM | POA: Diagnosis not present

## 2015-04-07 DIAGNOSIS — M9903 Segmental and somatic dysfunction of lumbar region: Secondary | ICD-10-CM | POA: Diagnosis not present

## 2015-04-07 DIAGNOSIS — M9904 Segmental and somatic dysfunction of sacral region: Secondary | ICD-10-CM | POA: Diagnosis not present

## 2015-04-07 DIAGNOSIS — M4327 Fusion of spine, lumbosacral region: Secondary | ICD-10-CM | POA: Diagnosis not present

## 2015-04-07 DIAGNOSIS — M5489 Other dorsalgia: Secondary | ICD-10-CM | POA: Diagnosis not present

## 2015-04-10 DIAGNOSIS — M9904 Segmental and somatic dysfunction of sacral region: Secondary | ICD-10-CM | POA: Diagnosis not present

## 2015-04-10 DIAGNOSIS — M4327 Fusion of spine, lumbosacral region: Secondary | ICD-10-CM | POA: Diagnosis not present

## 2015-04-10 DIAGNOSIS — M9903 Segmental and somatic dysfunction of lumbar region: Secondary | ICD-10-CM | POA: Diagnosis not present

## 2015-04-10 DIAGNOSIS — M5489 Other dorsalgia: Secondary | ICD-10-CM | POA: Diagnosis not present

## 2015-04-11 DIAGNOSIS — M5489 Other dorsalgia: Secondary | ICD-10-CM | POA: Diagnosis not present

## 2015-04-11 DIAGNOSIS — M4327 Fusion of spine, lumbosacral region: Secondary | ICD-10-CM | POA: Diagnosis not present

## 2015-04-11 DIAGNOSIS — M9903 Segmental and somatic dysfunction of lumbar region: Secondary | ICD-10-CM | POA: Diagnosis not present

## 2015-04-11 DIAGNOSIS — M9904 Segmental and somatic dysfunction of sacral region: Secondary | ICD-10-CM | POA: Diagnosis not present

## 2015-04-20 ENCOUNTER — Telehealth: Payer: Self-pay | Admitting: Family Medicine

## 2015-04-20 NOTE — Telephone Encounter (Signed)
Kim, Please call pt and find out what the readings are and what time of day they are occurring. Also verify current meds she is using for diabetes and current doses (and dose of insulins)

## 2015-04-20 NOTE — Telephone Encounter (Signed)
Pt has an upcoming appt for a CPE 05/09/15

## 2015-04-20 NOTE — Telephone Encounter (Signed)
[email or web address]

## 2015-04-20 NOTE — Telephone Encounter (Signed)
[phone] PT called and left VM wanting to know if she could down on her insulin because she is having to many low blood sugar attacks.

## 2015-04-21 NOTE — Telephone Encounter (Signed)
Pt called back. Only had that funny feeling one day, Tuesday, weak and swimmy headed.  That day her FBS was 100.  Her FBS this past week  Sun 107, Mon 115, Wed 164, Thur 114 and today 193.  She wants to wait until Tuesday to see what Dr Dennard Schaumann recommends.

## 2015-04-25 NOTE — Telephone Encounter (Signed)
These sugars sound normal/good.  May not have been low sugar.  Was her BP low?  Is she having any sugars below 100?  If so, decrease insulin by 10%.

## 2015-04-26 NOTE — Telephone Encounter (Signed)
Pt made aware of provider concerns.  No changes to insulin.  Keep track of blood sugar readings and bring to appt on 7/19

## 2015-05-02 ENCOUNTER — Ambulatory Visit: Payer: Commercial Managed Care - HMO | Admitting: Family Medicine

## 2015-05-09 ENCOUNTER — Encounter: Payer: Self-pay | Admitting: Family Medicine

## 2015-05-09 ENCOUNTER — Ambulatory Visit (INDEPENDENT_AMBULATORY_CARE_PROVIDER_SITE_OTHER): Payer: Commercial Managed Care - HMO | Admitting: Family Medicine

## 2015-05-09 VITALS — BP 136/80 | HR 72 | Temp 98.6°F | Resp 18 | Ht 65.5 in | Wt 218.0 lb

## 2015-05-09 DIAGNOSIS — Z0000 Encounter for general adult medical examination without abnormal findings: Secondary | ICD-10-CM | POA: Diagnosis not present

## 2015-05-09 DIAGNOSIS — Z23 Encounter for immunization: Secondary | ICD-10-CM

## 2015-05-09 DIAGNOSIS — E119 Type 2 diabetes mellitus without complications: Secondary | ICD-10-CM | POA: Diagnosis not present

## 2015-05-09 LAB — COMPLETE METABOLIC PANEL WITH GFR
ALT: 10 U/L (ref 0–35)
AST: 12 U/L (ref 0–37)
Albumin: 3.8 g/dL (ref 3.5–5.2)
Alkaline Phosphatase: 66 U/L (ref 39–117)
BUN: 10 mg/dL (ref 6–23)
CALCIUM: 9.2 mg/dL (ref 8.4–10.5)
CO2: 23 meq/L (ref 19–32)
Chloride: 107 mEq/L (ref 96–112)
Creat: 0.71 mg/dL (ref 0.50–1.10)
GFR, Est Non African American: 82 mL/min
Glucose, Bld: 103 mg/dL — ABNORMAL HIGH (ref 70–99)
POTASSIUM: 3.8 meq/L (ref 3.5–5.3)
SODIUM: 143 meq/L (ref 135–145)
Total Bilirubin: 1.1 mg/dL (ref 0.2–1.2)
Total Protein: 6.6 g/dL (ref 6.0–8.3)

## 2015-05-09 LAB — LIPID PANEL
CHOLESTEROL: 147 mg/dL (ref 0–200)
HDL: 56 mg/dL (ref >=46)
LDL Cholesterol: 70 mg/dL (ref 0–99)
TRIGLYCERIDES: 105 mg/dL (ref <150)
Total CHOL/HDL Ratio: 2.6 Ratio
VLDL: 21 mg/dL (ref 0–40)

## 2015-05-09 LAB — HEMOGLOBIN A1C
Hgb A1c MFr Bld: 7.9 % — ABNORMAL HIGH (ref <5.7)
Mean Plasma Glucose: 180 mg/dL — ABNORMAL HIGH (ref <117)

## 2015-05-09 NOTE — Progress Notes (Signed)
Subjective:    Patient ID: Amanda Riddle, female    DOB: [birth year], 80 y.o.   MRN: [id number]  HPI  Patient is here today for complete physical exam. Her mammogram was performed in February and was normal. She is not due for a bone density until next year. She has a history of a hysterectomy and therefore does not require a Pap smear. Her colonoscopy was performed in 2014 and did have a tubular adenoma but is not due again until 2019. Patient has had Pneumovax 23 but is due for Prevnar 13. Patient brings in fasting blood sugars and two-hour postprandial sugars over the last week. Her average fasting blood sugars between 100-140. Her average 2 hour postprandial sugar is between 101 180. These are excellent given her age.  Patient was unable to afford Januvia. Past Medical History  Diagnosis Date  . Hypertension   . Diabetes mellitus   . Hypercholesteremia    Past Surgical History  Procedure Laterality Date  . Abdominal hysterectomy  1986  . Tonsillectomy  1950   Current Outpatient Prescriptions on File Prior to Visit  Medication Sig Dispense Refill  . albuterol (PROVENTIL HFA;VENTOLIN HFA) 108 (90 BASE) MCG/ACT inhaler Inhale 2 puffs into the lungs every 6 (six) hours as needed. Shortness of breath 3 Inhaler 2  . amLODipine (NORVASC) 5 MG tablet TAKE 1 TABLET EVERY DAY 90 tablet 3  . aspirin EC 81 MG tablet Take 81 mg by mouth at bedtime.    Marland Kitchen atorvastatin (LIPITOR) 40 MG tablet TAKE 1 TABLET EVERY DAY 90 tablet 3  . Cyanocobalamin (VITAMIN B 12 PO) Take 1 tablet by mouth at bedtime.     . Flaxseed, Linseed, (FLAXSEED OIL PO) Take 1 capsule by mouth 2 (two) times daily.     . Glucosamine Sulfate-MSM (MSM-GLUCOSAMINE PO) Take 1 capsule by mouth 2 (two) times daily.    Marland Kitchen glucose blood (ONE TOUCH ULTRA TEST) test strip USE AS DIRECTED 4 TIMES A DAY PRE-MEAL AND AT BEDTIME 150 each prn  . Insulin Glargine (LANTUS) 100 UNIT/ML Solostar Pen Inject 45 Units into the skin every morning. INJECT  45 UNITS INTO THE SKIN qam    . Insulin Pen Needle (B-D UF III MINI PEN NEEDLES) 31G X 5 MM MISC CHECK BS QID (FOUR TIMES A DAY) Dx 250.00 300 each 4  . metFORMIN (GLUCOPHAGE) 1000 MG tablet TAKE 1 TABLET TWICE DAILY 180 tablet 3  . mometasone (ELOCON) 0.1 % ointment Apply topically daily. 45 g 0  . valsartan (DIOVAN) 320 MG tablet Take 1 tablet (320 mg total) by mouth daily. 90 tablet 3  . sitaGLIPtin (JANUVIA) 100 MG tablet Take 1 tablet (100 mg total) by mouth daily. (Patient not taking: Reported on 05/09/2015) 30 tablet 3   No current facility-administered medications on file prior to visit.   Allergies  Allergen Reactions  . Codeine Itching   History   Social History  . Marital Status: Divorced    Spouse Name: N/A  . Number of Children: N/A  . Years of Education: N/A   Occupational History  . Not on file.   Social History Main Topics  . Smoking status: Never Smoker   . Smokeless tobacco: Current User    Types: Snuff  . Alcohol Use: No  . Drug Use: No  . Sexual Activity: Not on file   Other Topics Concern  . Not on file   Social History Narrative   Family History  Problem Relation Age of  Onset  . Colon cancer Neg Hx       Review of Systems  All other systems reviewed and are negative.      Objective:   Physical Exam  Constitutional: She is oriented to person, place, and time. She appears well-developed and well-nourished. No distress.  HENT:  Head: Normocephalic and atraumatic.  Right Ear: External ear normal.  Left Ear: External ear normal.  Nose: Nose normal.  Mouth/Throat: Oropharynx is clear and moist. No oropharyngeal exudate.  Eyes: Conjunctivae and EOM are normal. Pupils are equal, round, and reactive to light. Right eye exhibits no discharge. Left eye exhibits no discharge. No scleral icterus.  Neck: Normal range of motion. Neck supple. No JVD present. No tracheal deviation present. No thyromegaly present.  Cardiovascular: Normal rate, regular  rhythm, normal heart sounds and intact distal pulses.  Exam reveals no gallop and no friction rub.   No murmur heard. Pulmonary/Chest: Effort normal and breath sounds normal. No stridor. No respiratory distress. She has no wheezes. She has no rales. She exhibits no tenderness.  Abdominal: Soft. Bowel sounds are normal. She exhibits no distension and no mass. There is no tenderness. There is no rebound and no guarding.  Musculoskeletal: Normal range of motion. She exhibits no edema or tenderness.  Lymphadenopathy:    She has no cervical adenopathy.  Neurological: She is alert and oriented to person, place, and time. She has normal reflexes. She displays normal reflexes. No cranial nerve deficit. She exhibits normal muscle tone. Coordination normal.  Skin: Skin is warm. No rash noted. She is not diaphoretic. No erythema. No pallor.  Psychiatric: She has a normal mood and affect. Her behavior is normal. Judgment and thought content normal.  Vitals reviewed.         Assessment & Plan:  Diabetes mellitus type II, controlled - Plan: COMPLETE METABOLIC PANEL WITH GFR, Lipid panel, Hemoglobin A1c, Microalbumin, urine, Pneumococcal polysaccharide vaccine 23-valent greater than or equal to 2yo subcutaneous/IM  Need for prophylactic vaccination against Streptococcus pneumoniae (pneumococcus) - Plan: Pneumococcal polysaccharide vaccine 23-valent greater than or equal to 2yo subcutaneous/IM  Routine general medical examination at a health care facility  Physical exam today is normal. Cancer screening is up-to-date. I will schedule the patient for a bone density next year. Otherwise her cancer screening is up-to-date. She received Prevnar 13 today in office. I will check a CBC, CMP, fasting lipid panel, urine microalbumin. Her blood pressures acceptable. Her diabetic eye exam is up-to-date. Diabetic foot exam is normal. The patient's reported blood sugars are within the normal range. I would like the  patient's blood sugars to be between 102 100 given her advanced age. The sugar she provided today are certainly within that range. Therefore I'll make no changes in her medication at this time

## 2015-05-10 LAB — MICROALBUMIN, URINE: Microalb, Ur: 5.1 mg/dL — ABNORMAL HIGH (ref <2.0)

## 2015-05-23 ENCOUNTER — Other Ambulatory Visit: Payer: Self-pay | Admitting: Family Medicine

## 2015-05-23 DIAGNOSIS — E1165 Type 2 diabetes mellitus with hyperglycemia: Secondary | ICD-10-CM

## 2015-05-23 DIAGNOSIS — IMO0002 Reserved for concepts with insufficient information to code with codable children: Secondary | ICD-10-CM

## 2015-05-23 MED ORDER — GLUCOSE BLOOD VI STRP: ORAL_STRIP | Status: DC

## 2015-05-23 NOTE — Telephone Encounter (Signed)
Test strips refilled

## 2015-05-24 ENCOUNTER — Other Ambulatory Visit: Payer: Self-pay | Admitting: Family Medicine

## 2015-05-24 DIAGNOSIS — IMO0002 Reserved for concepts with insufficient information to code with codable children: Secondary | ICD-10-CM

## 2015-05-24 DIAGNOSIS — E1165 Type 2 diabetes mellitus with hyperglycemia: Secondary | ICD-10-CM

## 2015-05-24 MED ORDER — GLUCOSE BLOOD VI STRP: ORAL_STRIP | Status: DC

## 2015-06-21 ENCOUNTER — Other Ambulatory Visit: Payer: Self-pay | Admitting: Family Medicine

## 2015-08-10 ENCOUNTER — Encounter: Payer: Self-pay | Admitting: Family Medicine

## 2015-08-10 ENCOUNTER — Ambulatory Visit (INDEPENDENT_AMBULATORY_CARE_PROVIDER_SITE_OTHER): Payer: Commercial Managed Care - HMO | Admitting: Family Medicine

## 2015-08-10 VITALS — BP 130/74 | HR 74 | Temp 98.1°F | Resp 16 | Ht 65.5 in | Wt 213.0 lb

## 2015-08-10 DIAGNOSIS — E119 Type 2 diabetes mellitus without complications: Secondary | ICD-10-CM | POA: Diagnosis not present

## 2015-08-10 DIAGNOSIS — Z794 Long term (current) use of insulin: Secondary | ICD-10-CM

## 2015-08-10 DIAGNOSIS — I10 Essential (primary) hypertension: Secondary | ICD-10-CM

## 2015-08-10 DIAGNOSIS — Z23 Encounter for immunization: Secondary | ICD-10-CM

## 2015-08-10 DIAGNOSIS — E785 Hyperlipidemia, unspecified: Secondary | ICD-10-CM | POA: Diagnosis not present

## 2015-08-10 LAB — COMPLETE METABOLIC PANEL WITH GFR
ALT: 16 U/L (ref 6–29)
AST: 17 U/L (ref 10–35)
Albumin: 4.1 g/dL (ref 3.6–5.1)
Alkaline Phosphatase: 75 U/L (ref 33–130)
BUN: 10 mg/dL (ref 7–25)
CO2: 24 mmol/L (ref 20–31)
Calcium: 9.3 mg/dL (ref 8.6–10.4)
Chloride: 105 mmol/L (ref 98–110)
Creat: 0.69 mg/dL (ref 0.60–0.93)
GFR, EST NON AFRICAN AMERICAN: 84 mL/min (ref >=60)
GFR, Est African American: >89 mL/min (ref >=60)
Glucose, Bld: 88 mg/dL (ref 70–99)
POTASSIUM: 3.6 mmol/L (ref 3.5–5.3)
SODIUM: 142 mmol/L (ref 135–146)
TOTAL PROTEIN: 6.5 g/dL (ref 6.1–8.1)
Total Bilirubin: 1 mg/dL (ref 0.2–1.2)

## 2015-08-10 LAB — LIPID PANEL
Cholesterol: 183 mg/dL (ref 125–200)
HDL: 57 mg/dL (ref >=46)
LDL CALC: 93 mg/dL (ref <130)
Total CHOL/HDL Ratio: 3.2 Ratio (ref <=5.0)
Triglycerides: 167 mg/dL — ABNORMAL HIGH (ref <150)
VLDL: 33 mg/dL — ABNORMAL HIGH (ref <30)

## 2015-08-10 LAB — HEMOGLOBIN A1C
HEMOGLOBIN A1C: 7.1 % — AB (ref <5.7)
MEAN PLASMA GLUCOSE: 157 mg/dL — AB (ref <117)

## 2015-08-10 LAB — CBC WITH DIFFERENTIAL/PLATELET
Basophils Absolute: 0 10*3/uL (ref 0.0–0.1)
Basophils Relative: 0 % (ref 0–1)
Eosinophils Absolute: 0.1 10*3/uL (ref 0.0–0.7)
Eosinophils Relative: 2 % (ref 0–5)
HEMATOCRIT: 38.2 % (ref 36.0–46.0)
HEMOGLOBIN: 12.8 g/dL (ref 12.0–15.0)
LYMPHS ABS: 2.6 10*3/uL (ref 0.7–4.0)
Lymphocytes Relative: 51 % — ABNORMAL HIGH (ref 12–46)
MCH: 29 pg (ref 26.0–34.0)
MCHC: 33.5 g/dL (ref 30.0–36.0)
MCV: 86.4 fL (ref 78.0–100.0)
MONOS PCT: 6 % (ref 3–12)
MPV: 9 fL (ref 8.6–12.4)
Monocytes Absolute: 0.3 10*3/uL (ref 0.1–1.0)
NEUTROS ABS: 2.1 10*3/uL (ref 1.7–7.7)
NEUTROS PCT: 41 % — AB (ref 43–77)
PLATELETS: 251 10*3/uL (ref 150–400)
RBC: 4.42 MIL/uL (ref 3.87–5.11)
RDW: 15.1 % (ref 11.5–15.5)
WBC: 5 10*3/uL (ref 4.0–10.5)

## 2015-08-10 NOTE — Progress Notes (Signed)
Subjective:    Patient ID: Amanda Riddle, female    DOB: Sep 30, 1935, 80 y.o.   MRN: [id number]  HPI  05/09/15 Patient is here today for complete physical exam. Her mammogram was performed in February and was normal. She is not due for a bone density until next year. She has a history of a hysterectomy and therefore does not require a Pap smear. Her colonoscopy was performed in 2014 and did have a tubular adenoma but is not due again until 2019. Patient has had Pneumovax 23 but is due for Prevnar 13. Patient brings in fasting blood sugars and two-hour postprandial sugars over the last week. Her average fasting blood sugars between 100-140. Her average 2 hour postprandial sugar is between 101 180. These are excellent given her age.  Patient was unable to afford Januvia.  At that time, my plan was: Physical exam today is normal. Cancer screening is up-to-date. I will schedule the patient for a bone density next year. Otherwise her cancer screening is up-to-date. She received Prevnar 13 today in office. I will check a CBC, CMP, fasting lipid panel, urine microalbumin. Her blood pressures acceptable. Her diabetic eye exam is up-to-date. Diabetic foot exam is normal. The patient's reported blood sugars are within the normal range. I would like the patient's blood sugars to be between 100-200 given her advanced age. The sugar she provided today are certainly within that range. Therefore I'll make no changes in her medication at this time.  08/10/15 Patient is here today for follow-up. Fasting blood sugars are ranging between 110 and 140. She denies any hypoglycemia. She does complain of trouble sleeping, poor energy, no desire, poor appetite. She has lost 5 pounds since I last saw her. She lives alone. Her sister recently passed. Her symptoms seem more consistent with depression. Her blood pressure today is well controlled at 130/74. She denies any chest pain shortness of breath or dyspnea on exertion. She  denies any myalgias or right upper quadrant pain on Lipitor  Past Medical History  Diagnosis Date  . Hypertension   . Diabetes mellitus   . Hypercholesteremia    Past Surgical History  Procedure Laterality Date  . Abdominal hysterectomy  1986  . Tonsillectomy  1950   Current Outpatient Prescriptions on File Prior to Visit  Medication Sig Dispense Refill  . albuterol (PROVENTIL HFA;VENTOLIN HFA) 108 (90 BASE) MCG/ACT inhaler Inhale 2 puffs into the lungs every 6 (six) hours as needed. Shortness of breath 3 Inhaler 2  . amLODipine (NORVASC) 5 MG tablet TAKE 1 TABLET EVERY DAY 90 tablet 3  . aspirin EC 81 MG tablet Take 81 mg by mouth at bedtime.    Marland Kitchen atorvastatin (LIPITOR) 40 MG tablet TAKE 1 TABLET EVERY DAY 90 tablet 3  . Cyanocobalamin (VITAMIN B 12 PO) Take 1 tablet by mouth at bedtime.     . Flaxseed, Linseed, (FLAXSEED OIL PO) Take 1 capsule by mouth 2 (two) times daily.     . Glucosamine Sulfate-MSM (MSM-GLUCOSAMINE PO) Take 1 capsule by mouth 2 (two) times daily.    Marland Kitchen glucose blood (ONE TOUCH ULTRA TEST) test strip USE AS DIRECTED 4 TIMES A DAY PRE-MEAL AND AT BEDTIME 150 each 11  . Insulin Pen Needle (B-D UF III MINI PEN NEEDLES) 31G X 5 MM MISC CHECK BS QID (FOUR TIMES A DAY) Dx 250.00 300 each 4  . LANTUS SOLOSTAR 100 UNIT/ML Solostar Pen INJECT 40 UNITS INTO THE SKIN AT BEDTIME 45 mL 3  .  metFORMIN (GLUCOPHAGE) 1000 MG tablet TAKE 1 TABLET TWICE DAILY 180 tablet 3  . mometasone (ELOCON) 0.1 % ointment Apply topically daily. 45 g 0  . sitaGLIPtin (JANUVIA) 100 MG tablet Take 1 tablet (100 mg total) by mouth daily. (Patient not taking: Reported on 05/09/2015) 30 tablet 3  . valsartan (DIOVAN) 320 MG tablet Take 1 tablet (320 mg total) by mouth daily. 90 tablet 3   No current facility-administered medications on file prior to visit.   Allergies  Allergen Reactions  . Codeine Itching   Social History   Social History  . Marital Status: Divorced    Spouse Name: N/A  .  Number of Children: N/A  . Years of Education: N/A   Occupational History  . Not on file.   Social History Main Topics  . Smoking status: Never Smoker   . Smokeless tobacco: Current User    Types: Snuff  . Alcohol Use: No  . Drug Use: No  . Sexual Activity: Not on file   Other Topics Concern  . Not on file   Social History Narrative   Family History  Problem Relation Age of Onset  . Colon cancer Neg Hx       Review of Systems  All other systems reviewed and are negative.      Objective:   Physical Exam  Constitutional: She is oriented to person, place, and time. She appears well-developed and well-nourished. No distress.  HENT:  Head: Normocephalic and atraumatic.  Eyes: Conjunctivae and EOM are normal. Pupils are equal, round, and reactive to light.  Neck: Neck supple. No JVD present.  Cardiovascular: Normal rate, regular rhythm, normal heart sounds and intact distal pulses.  Exam reveals no gallop and no friction rub.   No murmur heard. Pulmonary/Chest: Effort normal and breath sounds normal. No respiratory distress. She has no wheezes. She has no rales. She exhibits no tenderness.  Abdominal: Soft. Bowel sounds are normal. She exhibits no distension. There is no tenderness. There is no rebound.  Musculoskeletal: Normal range of motion. She exhibits no edema.  Lymphadenopathy:    She has no cervical adenopathy.  Neurological: She is alert and oriented to person, place, and time. No cranial nerve deficit. She exhibits normal muscle tone. Coordination normal.  Skin: Skin is warm. No rash noted. She is not diaphoretic. No erythema. No pallor.  Vitals reviewed.         Assessment & Plan:  HLD (hyperlipidemia)  Essential hypertension  Diabetes mellitus, type II, insulin dependent ([name]) - Plan: COMPLETE METABOLIC PANEL WITH GFR, CBC with Differential/Platelet, Lipid panel, Hemoglobin A1c, Microalbumin, urine  I will check a fasting lipid panel, hemoglobin A1c.  Goal LDL cholesterol is less than 100. Goal hemoglobin A1c is less than 7. Her blood pressure is well controlled. I believe a lot of the patient's symptoms including her trouble sleeping, her lack of energy, poor appetite to me sound consistent with depression. She tends to agree that she does not want to start medicine at the present time. If symptoms worsen, I would consider starting the patient on Remeron given the fact it is mild and she could take it at night and possibly help her sleep. She did receive her flu shot today

## 2015-08-10 NOTE — Addendum Note (Signed)
Addended by: Shary Decamp B on: 08/10/2015 11:51 AM   Modules accepted: Orders

## 2015-08-11 LAB — MICROALBUMIN, URINE: MICROALB UR: 4.1 mg/dL

## 2015-08-22 ENCOUNTER — Encounter: Payer: Self-pay | Admitting: Family Medicine

## 2015-08-22 ENCOUNTER — Ambulatory Visit (INDEPENDENT_AMBULATORY_CARE_PROVIDER_SITE_OTHER): Payer: Commercial Managed Care - HMO | Admitting: Family Medicine

## 2015-08-22 VITALS — BP 136/74 | HR 84 | Temp 97.7°F | Resp 16 | Ht 65.5 in | Wt 215.0 lb

## 2015-08-22 DIAGNOSIS — B029 Zoster without complications: Secondary | ICD-10-CM

## 2015-08-22 MED ORDER — HYDROXYZINE HCL 25 MG PO TABS: 25.0000 mg | ORAL_TABLET | Freq: Three times a day (TID) | ORAL | Status: DC | PRN

## 2015-08-22 MED ORDER — VALACYCLOVIR HCL 1 G PO TABS: 1000.0000 mg | ORAL_TABLET | Freq: Three times a day (TID) | ORAL | Status: DC

## 2015-08-22 NOTE — Progress Notes (Signed)
Subjective:    Patient ID: Amanda Riddle, female    DOB: [birth year], 80 y.o.   MRN: [id number]  HPI Patient symptoms began Sunday night. She developed a red vesicular rash starting in her neck and radiating down her posterior right shoulder area and the rash consists of clumps of 3-4 mm vesicles and papules clustering into a dermatomal pattern. She also has a similar rash radiating down her left arm from her left axilla down the dorsum of her left forearm also consisting of erythematous bumps and vesicles in a dermatomal pattern. The rash burns and itches. Past Medical History  Diagnosis Date  . Hypertension   . Diabetes mellitus   . Hypercholesteremia    Past Surgical History  Procedure Laterality Date  . Abdominal hysterectomy  1986  . Tonsillectomy  1950   Current Outpatient Prescriptions on File Prior to Visit  Medication Sig Dispense Refill  . albuterol (PROVENTIL HFA;VENTOLIN HFA) 108 (90 BASE) MCG/ACT inhaler Inhale 2 puffs into the lungs every 6 (six) hours as needed. Shortness of breath 3 Inhaler 2  . amLODipine (NORVASC) 5 MG tablet TAKE 1 TABLET EVERY DAY 90 tablet 3  . aspirin EC 81 MG tablet Take 81 mg by mouth at bedtime.    Marland Kitchen atorvastatin (LIPITOR) 40 MG tablet TAKE 1 TABLET EVERY DAY 90 tablet 3  . Cyanocobalamin (VITAMIN B 12 PO) Take 1 tablet by mouth at bedtime.     . Flaxseed, Linseed, (FLAXSEED OIL PO) Take 1 capsule by mouth 2 (two) times daily.     . Glucosamine Sulfate-MSM (MSM-GLUCOSAMINE PO) Take 1 capsule by mouth 2 (two) times daily.    Marland Kitchen glucose blood (ONE TOUCH ULTRA TEST) test strip USE AS DIRECTED 4 TIMES A DAY PRE-MEAL AND AT BEDTIME 150 each 11  . Insulin Pen Needle (B-D UF III MINI PEN NEEDLES) 31G X 5 MM MISC CHECK BS QID (FOUR TIMES A DAY) Dx 250.00 300 each 4  . LANTUS SOLOSTAR 100 UNIT/ML Solostar Pen INJECT 40 UNITS INTO THE SKIN AT BEDTIME (Patient taking differently: INJECT 45 UNITS INTO THE SKIN AT BEDTIME) 45 mL 3  . metFORMIN (GLUCOPHAGE)  1000 MG tablet TAKE 1 TABLET TWICE DAILY 180 tablet 3  . mometasone (ELOCON) 0.1 % ointment Apply topically daily. 45 g 0  . valsartan (DIOVAN) 320 MG tablet Take 1 tablet (320 mg total) by mouth daily. 90 tablet 3   No current facility-administered medications on file prior to visit.   Allergies  Allergen Reactions  . Codeine Itching   Social History   Social History  . Marital Status: Divorced    Spouse Name: N/A  . Number of Children: N/A  . Years of Education: N/A   Occupational History  . Not on file.   Social History Main Topics  . Smoking status: Never Smoker   . Smokeless tobacco: Current User    Types: Snuff  . Alcohol Use: No  . Drug Use: No  . Sexual Activity: Not on file   Other Topics Concern  . Not on file   Social History Narrative      Review of Systems  All other systems reviewed and are negative.      Objective:   Physical Exam  Cardiovascular: Normal rate, regular rhythm and normal heart sounds.   Pulmonary/Chest: Effort normal and breath sounds normal. No respiratory distress. She has no wheezes. She has no rales.  Skin: Rash noted. There is erythema.  Vitals reviewed.  Assessment & Plan:  Shingles - Plan: valACYclovir (VALTREX) 1000 MG tablet, hydrOXYzine (ATARAX/VISTARIL) 25 MG tablet  Patient has shingles. Begin Valtrex 1 g by mouth 3 times a day for 7 days. Use hydroxyzine 25 mg every 8 hours as needed for itching. Recheck if no better in 1 week or sooner if worse

## 2015-10-24 ENCOUNTER — Telehealth: Payer: Self-pay | Admitting: Family Medicine

## 2015-10-24 NOTE — Telephone Encounter (Signed)
Patient would like insulin pen needles called in if possible  [facility]  [phone]

## 2015-10-25 MED ORDER — INSULIN GLARGINE 100 UNIT/ML SOLOSTAR PEN: PEN_INJECTOR | SUBCUTANEOUS | Status: DC

## 2015-10-25 NOTE — Telephone Encounter (Signed)
Medication called/sent to requested pharmacy  

## 2015-10-26 ENCOUNTER — Telehealth: Payer: Self-pay | Admitting: Family Medicine

## 2015-10-26 MED ORDER — INSULIN PEN NEEDLE 31G X 5 MM MISC: Status: DC

## 2015-10-26 MED ORDER — INSULIN GLARGINE 100 UNIT/ML SOLOSTAR PEN: PEN_INJECTOR | SUBCUTANEOUS | Status: DC

## 2015-10-26 NOTE — Telephone Encounter (Signed)
Medication called/sent to requested pharmacy  

## 2015-10-26 NOTE — Telephone Encounter (Signed)
Geni Bers daughter of Mrs. Tiondra called requesting needles for patient's insulin. Patient uses [facility] on [address].  [phone]

## 2015-11-10 ENCOUNTER — Other Ambulatory Visit: Payer: Self-pay | Admitting: *Deleted

## 2015-11-10 MED ORDER — INSULIN GLARGINE 100 UNIT/ML SOLOSTAR PEN: PEN_INJECTOR | SUBCUTANEOUS | Status: DC

## 2015-11-10 NOTE — Telephone Encounter (Signed)
Received fax requesting refill on lantus.   Refill appropriate and filled per protocol.

## 2015-11-28 ENCOUNTER — Other Ambulatory Visit: Payer: Self-pay | Admitting: Family Medicine

## 2015-11-28 ENCOUNTER — Other Ambulatory Visit: Payer: Self-pay | Admitting: *Deleted

## 2015-11-28 ENCOUNTER — Encounter: Payer: Self-pay | Admitting: Family Medicine

## 2015-11-28 ENCOUNTER — Ambulatory Visit (INDEPENDENT_AMBULATORY_CARE_PROVIDER_SITE_OTHER): Payer: Commercial Managed Care - HMO | Admitting: Family Medicine

## 2015-11-28 ENCOUNTER — Ambulatory Visit: Payer: Commercial Managed Care - HMO | Admitting: Family Medicine

## 2015-11-28 VITALS — BP 160/70 | HR 80 | Temp 98.2°F | Resp 20 | Wt 208.0 lb

## 2015-11-28 DIAGNOSIS — E119 Type 2 diabetes mellitus without complications: Secondary | ICD-10-CM

## 2015-11-28 DIAGNOSIS — E118 Type 2 diabetes mellitus with unspecified complications: Principal | ICD-10-CM

## 2015-11-28 DIAGNOSIS — E785 Hyperlipidemia, unspecified: Secondary | ICD-10-CM

## 2015-11-28 DIAGNOSIS — E1165 Type 2 diabetes mellitus with hyperglycemia: Secondary | ICD-10-CM

## 2015-11-28 DIAGNOSIS — IMO0002 Reserved for concepts with insufficient information to code with codable children: Secondary | ICD-10-CM

## 2015-11-28 DIAGNOSIS — I10 Essential (primary) hypertension: Secondary | ICD-10-CM

## 2015-11-28 DIAGNOSIS — Z794 Long term (current) use of insulin: Secondary | ICD-10-CM | POA: Diagnosis not present

## 2015-11-28 DIAGNOSIS — R55 Syncope and collapse: Secondary | ICD-10-CM | POA: Diagnosis not present

## 2015-11-28 LAB — COMPLETE METABOLIC PANEL WITH GFR
ALT: 12 U/L (ref 6–29)
AST: 14 U/L (ref 10–35)
Albumin: 4.1 g/dL (ref 3.6–5.1)
Alkaline Phosphatase: 69 U/L (ref 33–130)
BUN: 11 mg/dL (ref 7–25)
CHLORIDE: 105 mmol/L (ref 98–110)
CO2: 24 mmol/L (ref 20–31)
Calcium: 9.4 mg/dL (ref 8.6–10.4)
Creat: 0.84 mg/dL (ref 0.60–0.93)
GFR, EST AFRICAN AMERICAN: 77 mL/min (ref >=60)
GFR, EST NON AFRICAN AMERICAN: 67 mL/min (ref >=60)
GLUCOSE: 96 mg/dL (ref 70–99)
Potassium: 4 mmol/L (ref 3.5–5.3)
SODIUM: 143 mmol/L (ref 135–146)
TOTAL PROTEIN: 6.7 g/dL (ref 6.1–8.1)
Total Bilirubin: 1.5 mg/dL — ABNORMAL HIGH (ref 0.2–1.2)

## 2015-11-28 LAB — CBC WITH DIFFERENTIAL/PLATELET
BASOS ABS: 0 10*3/uL (ref 0.0–0.1)
BASOS PCT: 0 % (ref 0–1)
EOS ABS: 0.1 10*3/uL (ref 0.0–0.7)
EOS PCT: 2 % (ref 0–5)
HCT: 40.1 % (ref 36.0–46.0)
Hemoglobin: 13.4 g/dL (ref 12.0–15.0)
LYMPHS ABS: 2.9 10*3/uL (ref 0.7–4.0)
Lymphocytes Relative: 55 % — ABNORMAL HIGH (ref 12–46)
MCH: 29.3 pg (ref 26.0–34.0)
MCHC: 33.4 g/dL (ref 30.0–36.0)
MCV: 87.6 fL (ref 78.0–100.0)
MONOS PCT: 6 % (ref 3–12)
MPV: 9 fL (ref 8.6–12.4)
Monocytes Absolute: 0.3 10*3/uL (ref 0.1–1.0)
NEUTROS PCT: 37 % — AB (ref 43–77)
Neutro Abs: 2 10*3/uL (ref 1.7–7.7)
PLATELETS: 284 10*3/uL (ref 150–400)
RBC: 4.58 MIL/uL (ref 3.87–5.11)
RDW: 15.6 % — AB (ref 11.5–15.5)
WBC: 5.3 10*3/uL (ref 4.0–10.5)

## 2015-11-28 LAB — LIPID PANEL
CHOL/HDL RATIO: 2.7 ratio (ref <=5.0)
CHOLESTEROL: 177 mg/dL (ref 125–200)
HDL: 65 mg/dL (ref >=46)
LDL Cholesterol: 81 mg/dL (ref <130)
TRIGLYCERIDES: 157 mg/dL — AB (ref <150)
VLDL: 31 mg/dL — AB (ref <30)

## 2015-11-28 MED ORDER — LANCETS ULTRA FINE MISC: Status: DC

## 2015-11-28 MED ORDER — GLUCOSE BLOOD VI STRP: 1.0000 | ORAL_STRIP | Status: DC

## 2015-11-28 MED ORDER — ONETOUCH ULTRASOFT LANCETS MISC: Status: DC

## 2015-11-28 MED ORDER — BLOOD GLUCOSE TEST VI STRP: ORAL_STRIP | Status: DC

## 2015-11-28 MED ORDER — HYDROCHLOROTHIAZIDE 25 MG PO TABS: 25.0000 mg | ORAL_TABLET | Freq: Every day | ORAL | Status: DC

## 2015-11-28 MED ORDER — INSULIN PEN NEEDLE 31G X 5 MM MISC
1.0000 | Freq: Every day | Status: DC
Start: 2015-11-28 — End: 2018-02-27

## 2015-11-28 MED ORDER — BLOOD GLUCOSE MONITOR SYSTEM W/DEVICE KIT: PACK | Status: DC

## 2015-11-28 NOTE — Telephone Encounter (Signed)
Diabetic supplies to pharmacy 

## 2015-11-28 NOTE — Telephone Encounter (Signed)
Received call from patient daughter.   Reports that she has been attempting to obtain DM supplies x1 month.   Advised that no other documentation is noted besides fax that was received today in regards to refill on DM supplies while patient was in office.   Advised of the change in prescription.   Verbalized understanding.

## 2015-11-28 NOTE — Progress Notes (Signed)
Subjective:    Patient ID: Amanda Riddle, female    DOB: 1934/11/14, 81 y.o.   MRN: [id number]  HPI 08/10/15 Patient is here today for follow-up. Fasting blood sugars are ranging between 110 and 140. She denies any hypoglycemia. She does complain of trouble sleeping, poor energy, no desire, poor appetite. She has lost 5 pounds since I last saw her. She lives alone. Her sister recently passed. Her symptoms seem more consistent with depression. Her blood pressure today is well controlled at 130/74. She denies any chest pain shortness of breath or dyspnea on exertion. She denies any myalgias or right upper quadrant pain on Lipitor. At that time, my plan was: I will check a fasting lipid panel, hemoglobin A1c. Goal LDL cholesterol is less than 100. Goal hemoglobin A1c is less than 7. Her blood pressure is well controlled. I believe a lot of the patient's symptoms including her trouble sleeping, her lack of energy, poor appetite to me sound consistent with depression. She tends to agree that she does not want to start medicine at the present time. If symptoms worsen, I would consider starting the patient on Remeron given the fact it is mild and she could take it at night and possibly help her sleep. She did receive her flu shot today.  11/27/14 She is here today for follow-up of her diabetes mellitus type 2.  Patient's fasting blood sugars are typically between 120 and [id number] which are certainly high. However this when she takes her 45 units of Lantus. The sugars throughout the rest of the day range between 100-130. She is not having any hypoglycemic episodes.  The remainder of her sugars are excellent. I believe the elevated sugar she is experiencing in the morning are likely due to the fact it is been 24 hours and she last took her insulin. She is also been under more stress recently. As the patient states, her bills are more than the money she makes. She is not yet have a job. This has her extremely anxious and  is contributing to some insomnia. Last week she had an episode right after she took her sugar or her heart started to race. She felt extremely lightheaded like she may pass out. She had to go lay down on the bed. There was no vertigo. There was no chest pain or shortness of breath. Symptoms lasted for approximately 10 minutes. She has never had symptoms like that before. Patient is unsure if it was a panic attack or she was experiencing some sort of tachyarrhythmia.  Past Medical History  Diagnosis Date  . Hypertension   . Diabetes mellitus   . Hypercholesteremia    Past Surgical History  Procedure Laterality Date  . Abdominal hysterectomy  1986  . Tonsillectomy  1950   Current Outpatient Prescriptions on File Prior to Visit  Medication Sig Dispense Refill  . albuterol (PROVENTIL HFA;VENTOLIN HFA) 108 (90 BASE) MCG/ACT inhaler Inhale 2 puffs into the lungs every 6 (six) hours as needed. Shortness of breath 3 Inhaler 2  . amLODipine (NORVASC) 5 MG tablet TAKE 1 TABLET EVERY DAY 90 tablet 3  . aspirin EC 81 MG tablet Take 81 mg by mouth at bedtime.    Marland Kitchen atorvastatin (LIPITOR) 40 MG tablet TAKE 1 TABLET EVERY DAY 90 tablet 3  . Cyanocobalamin (VITAMIN B 12 PO) Take 1 tablet by mouth at bedtime.     . Flaxseed, Linseed, (FLAXSEED OIL PO) Take 1 capsule by mouth 2 (two) times daily.     Marland Kitchen  Glucosamine Sulfate-MSM (MSM-GLUCOSAMINE PO) Take 1 capsule by mouth 2 (two) times daily.    Marland Kitchen glucose blood (ONE TOUCH ULTRA TEST) test strip USE AS DIRECTED 4 TIMES A DAY PRE-MEAL AND AT BEDTIME 150 each 11  . hydrOXYzine (ATARAX/VISTARIL) 25 MG tablet Take 1 tablet (25 mg total) by mouth 3 (three) times daily as needed. 30 tablet 0  . Insulin Glargine (LANTUS SOLOSTAR) 100 UNIT/ML Solostar Pen INJECT 45 UNITS INTO THE SKIN AT BEDTIME 45 mL 3  . Insulin Pen Needle (B-D UF III MINI PEN NEEDLES) 31G X 5 MM MISC CHECK BS QID (FOUR TIMES A DAY) Dx 250.00 300 each 4  . metFORMIN (GLUCOPHAGE) 1000 MG tablet TAKE 1  TABLET TWICE DAILY 180 tablet 3  . mometasone (ELOCON) 0.1 % ointment Apply topically daily. 45 g 0  . valACYclovir (VALTREX) 1000 MG tablet Take 1 tablet (1,000 mg total) by mouth 3 (three) times daily. 21 tablet 0  . valsartan (DIOVAN) 320 MG tablet Take 1 tablet (320 mg total) by mouth daily. 90 tablet 3   No current facility-administered medications on file prior to visit.   Allergies  Allergen Reactions  . Codeine Itching   Social History   Social History  . Marital Status: Divorced    Spouse Name: N/A  . Number of Children: N/A  . Years of Education: N/A   Occupational History  . Not on file.   Social History Main Topics  . Smoking status: Never Smoker   . Smokeless tobacco: Current User    Types: Snuff  . Alcohol Use: No  . Drug Use: No  . Sexual Activity: Not on file   Other Topics Concern  . Not on file   Social History Narrative   Family History  Problem Relation Age of Onset  . Colon cancer Neg Hx       Review of Systems  All other systems reviewed and are negative.      Objective:   Physical Exam  Constitutional: She is oriented to person, place, and time. She appears well-developed and well-nourished. No distress.  HENT:  Head: Normocephalic and atraumatic.  Eyes: Conjunctivae and EOM are normal. Pupils are equal, round, and reactive to light.  Neck: Neck supple. No JVD present.  Cardiovascular: Normal rate, regular rhythm, normal heart sounds and intact distal pulses.  Exam reveals no gallop and no friction rub.   No murmur heard. Pulmonary/Chest: Effort normal and breath sounds normal. No respiratory distress. She has no wheezes. She has no rales. She exhibits no tenderness.  Abdominal: Soft. Bowel sounds are normal. She exhibits no distension. There is no tenderness. There is no rebound.  Musculoskeletal: Normal range of motion. She exhibits no edema.  Lymphadenopathy:    She has no cervical adenopathy.  Neurological: She is alert and  oriented to person, place, and time. No cranial nerve deficit. She exhibits normal muscle tone. Coordination normal.  Skin: Skin is warm. No rash noted. She is not diaphoretic. No erythema. No pallor.  Vitals reviewed.         Assessment & Plan:  HLD (hyperlipidemia)  Essential hypertension  Controlled type 2 diabetes mellitus without complication, with long-term current use of insulin ([place]) - Plan: CBC with Differential/Platelet, COMPLETE METABOLIC PANEL WITH GFR, Hemoglobin A1c, Lipid panel, Microalbumin, urine  Her blood pressure today is extremely high 160/70. I am concerned about a near syncopal episode that she had. While it could be a panic attack, her age and her other medical  comorbidities may be concerned about some underlying cardiac issue. I will obtain an EKG today to evaluate that further. Her reported blood sugars are excellent. I will make no changes in her insulin and I will check a hemoglobin A1c along with a urine microalbumin and a fasting lipid panel. Her goal LDL cholesterol is less than 100 given her history of diabetes. I would be willing to accept a hemoglobin A1c less than 7.  Given her elevated blood pressure, I will add hydrochlorothiazide 25 mg by mouth daily to help address her blood pressure.  EKG shows normal sinus rhythm with normal intervals and a normal axis and no evidence of ischemia or infarction. I will schedule the patient to have a 24-hour Holter monitor to evaluate further.

## 2015-11-28 NOTE — Telephone Encounter (Signed)
Received fax requesting PA on One Touch Ultra Test Strips.   New prescription sent in to pharmacy for new meter base on insurance preference.

## 2015-11-29 ENCOUNTER — Ambulatory Visit (INDEPENDENT_AMBULATORY_CARE_PROVIDER_SITE_OTHER): Payer: Commercial Managed Care - HMO | Admitting: Family Medicine

## 2015-11-29 DIAGNOSIS — R55 Syncope and collapse: Secondary | ICD-10-CM

## 2015-11-29 LAB — HEMOGLOBIN A1C
Hgb A1c MFr Bld: 7.4 % — ABNORMAL HIGH (ref <5.7)
MEAN PLASMA GLUCOSE: 166 mg/dL — AB (ref <117)

## 2015-11-29 LAB — MICROALBUMIN, URINE: Microalb, Ur: 7.8 mg/dL

## 2015-11-29 NOTE — Progress Notes (Signed)
Patient ID: Amanda Riddle, female   DOB: 16-Apr-1935, 81 y.o.   MRN: [id number] Pt had Holter Monitor applied yesterday per Dr Samella Parr request.  Returns today for removal.  No noted problems while wearing monitor.  Pt did not return diary.

## 2015-12-01 ENCOUNTER — Other Ambulatory Visit: Payer: Self-pay

## 2015-12-01 DIAGNOSIS — Z1231 Encounter for screening mammogram for malignant neoplasm of breast: Secondary | ICD-10-CM

## 2015-12-05 ENCOUNTER — Telehealth: Payer: Self-pay | Admitting: Family Medicine

## 2015-12-05 NOTE — Telephone Encounter (Signed)
Per Dr. Dennard Schaumann results show occasional PVC's but nothing serious.   Called placed and pt made aware of results.

## 2015-12-22 ENCOUNTER — Ambulatory Visit
Admission: RE | Admit: 2015-12-22 | Discharge: 2015-12-22 | Disposition: A | Discharge status: Home / Self Care | Payer: Commercial Managed Care - HMO | Source: Ambulatory Visit

## 2015-12-22 DIAGNOSIS — Z1231 Encounter for screening mammogram for malignant neoplasm of breast: Secondary | ICD-10-CM

## 2016-01-10 ENCOUNTER — Encounter: Payer: Self-pay | Admitting: Family Medicine

## 2016-01-30 ENCOUNTER — Other Ambulatory Visit: Payer: Self-pay | Admitting: Family Medicine

## 2016-03-28 ENCOUNTER — Emergency Department (HOSPITAL_COMMUNITY): Payer: Commercial Managed Care - HMO

## 2016-03-28 ENCOUNTER — Encounter (HOSPITAL_COMMUNITY): Payer: Self-pay

## 2016-03-28 ENCOUNTER — Emergency Department (HOSPITAL_COMMUNITY)
Admission: EM | Admit: 2016-03-28 | Discharge: 2016-03-28 | Disposition: A | Discharge status: Home / Self Care | Payer: Commercial Managed Care - HMO | Attending: Emergency Medicine | Admitting: Emergency Medicine

## 2016-03-28 DIAGNOSIS — R110 Nausea: Secondary | ICD-10-CM | POA: Insufficient documentation

## 2016-03-28 DIAGNOSIS — E109 Type 1 diabetes mellitus without complications: Secondary | ICD-10-CM | POA: Diagnosis not present

## 2016-03-28 DIAGNOSIS — R05 Cough: Secondary | ICD-10-CM | POA: Diagnosis not present

## 2016-03-28 DIAGNOSIS — Z7982 Long term (current) use of aspirin: Secondary | ICD-10-CM | POA: Diagnosis not present

## 2016-03-28 DIAGNOSIS — M79602 Pain in left arm: Secondary | ICD-10-CM | POA: Diagnosis not present

## 2016-03-28 DIAGNOSIS — Z79899 Other long term (current) drug therapy: Secondary | ICD-10-CM | POA: Diagnosis not present

## 2016-03-28 DIAGNOSIS — I10 Essential (primary) hypertension: Secondary | ICD-10-CM | POA: Diagnosis not present

## 2016-03-28 LAB — CBC
HCT: 37.8 % (ref 36.0–46.0)
HEMOGLOBIN: 12.8 g/dL (ref 12.0–15.0)
MCH: 28.9 pg (ref 26.0–34.0)
MCHC: 33.9 g/dL (ref 30.0–36.0)
MCV: 85.3 fL (ref 78.0–100.0)
PLATELETS: 241 10*3/uL (ref 150–400)
RBC: 4.43 MIL/uL (ref 3.87–5.11)
RDW: 13.5 % (ref 11.5–15.5)
WBC: 6.4 10*3/uL (ref 4.0–10.5)

## 2016-03-28 LAB — BASIC METABOLIC PANEL
ANION GAP: 12 (ref 5–15)
BUN: 11 mg/dL (ref 6–20)
CALCIUM: 9.8 mg/dL (ref 8.9–10.3)
CO2: 23 mmol/L (ref 22–32)
Chloride: 104 mmol/L (ref 101–111)
Creatinine, Ser: 0.98 mg/dL (ref 0.44–1.00)
GFR, EST NON AFRICAN AMERICAN: 54 mL/min — AB (ref >60)
Glucose, Bld: 133 mg/dL — ABNORMAL HIGH (ref 65–99)
Potassium: 3.5 mmol/L (ref 3.5–5.1)
SODIUM: 139 mmol/L (ref 135–145)

## 2016-03-28 LAB — I-STAT TROPONIN, ED
TROPONIN I, POC: 0 ng/mL (ref 0.00–0.08)
Troponin i, poc: 0 ng/mL (ref 0.00–0.08)

## 2016-03-28 LAB — I-STAT CG4 LACTIC ACID, ED: Lactic Acid, Venous: 4.6 mmol/L (ref 0.5–2.0)

## 2016-03-28 NOTE — ED Provider Notes (Signed)
CSN: [id number]     Arrival date & time 03/28/16  1505 History   First MD Initiated Contact with Patient 03/28/16 1642     Chief Complaint  Patient presents with  . Arm Pain     (Consider location/radiation/quality/duration/timing/severity/associated sxs/prior Treatment) HPI Amanda Riddle is a 81 y.o. female history of hypertension, insulin-dependent diabetes, hypokalemia, here for valuation of left-sided arm pain. Patient reports she has had intermittent, fleeting, burning left-sided arm pain diffusely since she woke up this morning at 6:00 AM. She thinks she may have slept on her arm wrong. She denies any associated chest pain, shortness of breath, vomiting, diaphoresis, numbness or weakness, unilateral arm swelling, cough. No vision changes, headache, neck pain or stiffness. She denies any decrease in range of motion. She denies any discomfort whatsoever now on the emergency department. Reports that she is hungry. Nothing makes the problem better or worse. No other modifying factors.  Past Medical History  Diagnosis Date  . Hypertension   . Diabetes mellitus   . Hypercholesteremia    Past Surgical History  Procedure Laterality Date  . Abdominal hysterectomy  1986  . Tonsillectomy  1950   Family History  Problem Relation Age of Onset  . Colon cancer Neg Hx    Social History  Substance Use Topics  . Smoking status: Never Smoker   . Smokeless tobacco: Current User    Types: Snuff  . Alcohol Use: No   OB History    No data available     Review of Systems A 10 point review of systems was completed and was negative except for pertinent positives and negatives as mentioned in the history of present illness     Allergies  Codeine  Home Medications   Prior to Admission medications   Medication Sig Start Date End Date Taking? Authorizing Provider  albuterol (PROVENTIL HFA;VENTOLIN HFA) 108 (90 BASE) MCG/ACT inhaler Inhale 2 puffs into the lungs every 6 (six) hours as needed.  Shortness of breath 03/01/14   Susy Frizzle, MD  amLODipine (NORVASC) 5 MG tablet TAKE 1 TABLET EVERY DAY 01/31/16   Susy Frizzle, MD  aspirin EC 81 MG tablet Take 81 mg by mouth at bedtime.    Historical Provider, MD  atorvastatin (LIPITOR) 40 MG tablet TAKE 1 TABLET EVERY DAY 01/31/16   Susy Frizzle, MD  Blood Glucose Monitoring Suppl (BLOOD GLUCOSE MONITOR SYSTEM) w/Device KIT Dispense based on patient and insurance preference. Use to monitor FSBS 4x daily d/t fluctuating blood glucose. Dx: E11.65 11/28/15   Susy Frizzle, MD  Cyanocobalamin (VITAMIN B 12 PO) Take 1 tablet by mouth at bedtime.     Historical Provider, MD  Flaxseed, Linseed, (FLAXSEED OIL PO) Take 1 capsule by mouth 2 (two) times daily.     Historical Provider, MD  Glucosamine Sulfate-MSM (MSM-GLUCOSAMINE PO) Take 1 capsule by mouth 2 (two) times daily.    Historical Provider, MD  Glucose Blood (BLOOD GLUCOSE TEST STRIPS) STRP Dispense based on patient and insurance preference. Use to monitor FSBS 4x daily d/t fluctuating blood glucose. Dx: E11.65 11/28/15   Susy Frizzle, MD  hydrochlorothiazide (HYDRODIURIL) 25 MG tablet Take 1 tablet (25 mg total) by mouth daily. 11/28/15   Susy Frizzle, MD  hydrOXYzine (ATARAX/VISTARIL) 25 MG tablet Take 1 tablet (25 mg total) by mouth 3 (three) times daily as needed. 08/22/15   Susy Frizzle, MD  Insulin Glargine (LANTUS SOLOSTAR) 100 UNIT/ML Solostar Pen INJECT 45 UNITS INTO THE  SKIN AT BEDTIME 11/10/15   Susy Frizzle, MD  Insulin Pen Needle (B-D UF III MINI PEN NEEDLES) 31G X 5 MM MISC 1 each by Does not apply route daily. CHECK BS QID (FOUR TIMES A DAY) Dx 250.00 11/28/15   Susy Frizzle, MD  LANCETS ULTRA FINE MISC Dispense based on patient and insurance preference. Use to monitor FSBS 4x daily d/t fluctuating blood glucose. Dx: E11.65 11/28/15   Susy Frizzle, MD  metFORMIN (GLUCOPHAGE) 1000 MG tablet TAKE 1 TABLET TWICE DAILY 01/31/16   Susy Frizzle, MD  mometasone  (ELOCON) 0.1 % ointment Apply topically daily. 09/13/14   Susy Frizzle, MD  valACYclovir (VALTREX) 1000 MG tablet Take 1 tablet (1,000 mg total) by mouth 3 (three) times daily. 08/22/15   Susy Frizzle, MD  valsartan (DIOVAN) 320 MG tablet Take 1 tablet (320 mg total) by mouth daily. 06/21/15   Susy Frizzle, MD   BP 116/50 mmHg  Pulse 71  Temp(Src) 97.6 F (36.4 C) (Oral)  Resp 14  Ht 5' 5.5" (1.664 m)  Wt 94.802 kg  BMI 34.24 kg/m2  SpO2 97% Physical Exam  Constitutional: She is oriented to person, place, and time. She appears well-developed and well-nourished.  HENT:  Head: Normocephalic and atraumatic.  Mouth/Throat: Oropharynx is clear and moist.  Eyes: Conjunctivae are normal. Pupils are equal, round, and reactive to light. Right eye exhibits no discharge. Left eye exhibits no discharge. No scleral icterus.  Neck: Neck supple.  Cardiovascular: Normal rate, regular rhythm and normal heart sounds.   Pulmonary/Chest: Effort normal and breath sounds normal. No respiratory distress. She has no wheezes. She has no rales.  Abdominal: Soft. There is no tenderness.  Musculoskeletal: She exhibits no tenderness.  Left arm is grossly normal without any abnormal findings. Full active range of motion. Distal pulses intact with brisk cap refill. No unilateral arm swelling, erythema.  Neurological: She is alert and oriented to person, place, and time.  Cranial Nerves II-XII grossly intact. Sensation is intact to light touch. Grip strength intact and equal bilaterally. Motor strength is 5/5 in bilateral upper extremities.   Skin: Skin is warm and dry. No rash noted.  Psychiatric: She has a normal mood and affect.  Nursing note and vitals reviewed.   ED Course  Procedures (including critical care time) Labs Review Labs Reviewed  BASIC METABOLIC PANEL - Abnormal; Notable for the following:    Glucose, Bld 133 (*)    GFR calc non Af Amer 54 (*)    All other components within normal  limits  I-STAT CG4 LACTIC ACID, ED - Abnormal; Notable for the following:    Lactic Acid, Venous 4.60 (*)    All other components within normal limits  CBC  I-STAT TROPOININ, ED  I-STAT CG4 LACTIC ACID, ED  Randolm Idol, ED    Imaging Review Dg Chest 2 View  03/28/2016  CLINICAL DATA:  Cough. EXAM: CHEST  2 VIEW COMPARISON:  July 02, 2013 FINDINGS: The heart size and mediastinal contours are within normal limits. Both lungs are clear. The visualized skeletal structures are unremarkable. IMPRESSION: No active cardiopulmonary disease. Electronically Signed   By: Dorise Bullion III M.D   On: 03/28/2016 15:44   I have personally reviewed and evaluated these images and lab results as part of my medical decision-making.   EKG Interpretation   Date/Time:  Thursday March 28 2016 15:17:04 EDT Ventricular Rate:  89 PR Interval:  148 QRS Duration: 86 QT  Interval:  344 QTC Calculation: 418 R Axis:   51 Text Interpretation:  Normal sinus rhythm Nonspecific T wave abnormality  Abnormal ECG No significant change since last tracing Confirmed by  Winfred Leeds  MD, SAM [phone]) on 03/28/2016 6:16:42 PM      MDM  Patient with a history of hypertension, insulin-dependent diabetes on metformin here for evaluation of left arm Pain. Clinical presentation not consistent with DVT, CVA or other emergent central cause for patient's symptoms. She has a nonfocal neuro exam. Benign physical exam. Denies any discomfort now. Symptoms possibly due to positioning and how patient slept. She appears very well on exam. She is afebrile, hemodynamically stable with no leukocytosis and no evidence of infection. ECG is unchanged and troponin is negative. However, screening labs are significant for a lactic acid of 4.6. I feel this is likely secondary to metformin use as there is no evidence of infection.  Discussed with my attending, Dr. Winfred Leeds who also saw and evaluating the patient. We'll plan for delta troponin.  If negative and no other objective findings, anticipate discharge home to follow up with PCP. Final diagnoses:  Left arm pain       Comer Locket, PA-C 03/28/16 2059  Orlie Dakin, MD 03/29/16 4695

## 2016-03-28 NOTE — ED Notes (Signed)
Mini lab informed about needing to run Istat Troponin. Charge RN aware of pt elevated lactic.

## 2016-03-28 NOTE — Discharge Instructions (Signed)
There is not appear to be an emergent cause for your symptoms at this time. Your exam, labs were all reassuring. Please follow-up with your doctor for reevaluation. Return to ED for new worsening symptoms as we discussed.

## 2016-03-28 NOTE — ED Notes (Signed)
MD Lacinda Axon notified of patient critical lab value.

## 2016-03-28 NOTE — ED Provider Notes (Signed)
Complains of left arm pain over left upper arm and forearm onset morning upon awakening. Pain lasts 5 minutes of time. Nothing makes symptoms better or worse. Associated symptoms include mild nausea. No fever No shortness of breath no chest pain no other associated symptoms no treatment prior to coming here. She is presently asymptomatic. On exam no distress lungs clear to [place] patient heart regular rate and rhythm abdomen obese nontender all 4 extremities without redness or tenderness neurovascular intact. Radial pulses 2+ bilaterally. Both upper extremities with full range of motion and good capillary refill. elevatedlactate is felt to be secondary to metformin usage.. Patient has no fever no white count signs or symptoms of infection  Orlie Dakin, MD 03/28/16 1819

## 2016-03-28 NOTE — ED Notes (Signed)
Pt reports left arm pain onset this morning around 6am associated with nausea. She denies recent arm injury or heavy lifting. Denies CP/SOB.

## 2016-04-02 ENCOUNTER — Encounter: Payer: Self-pay | Admitting: Family Medicine

## 2016-04-02 ENCOUNTER — Ambulatory Visit (INDEPENDENT_AMBULATORY_CARE_PROVIDER_SITE_OTHER): Payer: Commercial Managed Care - HMO | Admitting: Family Medicine

## 2016-04-02 VITALS — BP 122/74 | HR 76 | Temp 98.1°F | Resp 16 | Ht 65.5 in | Wt 211.0 lb

## 2016-04-02 DIAGNOSIS — M79602 Pain in left arm: Secondary | ICD-10-CM | POA: Diagnosis not present

## 2016-04-02 DIAGNOSIS — Z09 Encounter for follow-up examination after completed treatment for conditions other than malignant neoplasm: Secondary | ICD-10-CM

## 2016-04-02 NOTE — Progress Notes (Signed)
Subjective:    Patient ID: Amanda Riddle, female    DOB: 1935/04/28, 81 y.o.   MRN: [id number]  HPI Awoke the morning of June 8 with severe pain in her left arm from her shoulder to her fingertips. The pain was intense. It was unrelated to movement or position. She denied any weakness or numbness but intense deep aching pain from her shoulder to her fingers. Was seen in the emergency room. ACS was ruled out. Chest x-ray was normal. Lactic acid was checked and was elevated at 4.6. Per the patient's report she was diagnosed with stress reaction due to the death of 4 family members in a short period of time. Today she is pain-free. She denies any chest pain shortness of breath or dyspnea on exertion. She denies any abdominal pain. There is no further pain in her left arm. There is no pain with range of motion in her neck or her shoulder or her elbow Past Medical History  Diagnosis Date  . Hypertension   . Diabetes mellitus   . Hypercholesteremia    Past Surgical History  Procedure Laterality Date  . Abdominal hysterectomy  1986  . Tonsillectomy  1950   Current Outpatient Prescriptions on File Prior to Visit  Medication Sig Dispense Refill  . albuterol (PROVENTIL HFA;VENTOLIN HFA) 108 (90 BASE) MCG/ACT inhaler Inhale 2 puffs into the lungs every 6 (six) hours as needed. Shortness of breath 3 Inhaler 2  . amLODipine (NORVASC) 5 MG tablet TAKE 1 TABLET EVERY DAY 90 tablet 3  . aspirin EC 81 MG tablet Take 81 mg by mouth at bedtime.    Marland Kitchen atorvastatin (LIPITOR) 40 MG tablet TAKE 1 TABLET EVERY DAY 90 tablet 3  . Blood Glucose Monitoring Suppl (BLOOD GLUCOSE MONITOR SYSTEM) w/Device KIT Dispense based on patient and insurance preference. Use to monitor FSBS 4x daily d/t fluctuating blood glucose. Dx: E11.65 1 each 1  . Cyanocobalamin (VITAMIN B 12 PO) Take 1 tablet by mouth at bedtime.     . Flaxseed, Linseed, (FLAXSEED OIL PO) Take 1 capsule by mouth 2 (two) times daily.     . Glucosamine  Sulfate-MSM (MSM-GLUCOSAMINE PO) Take 1 capsule by mouth 2 (two) times daily.    . Glucose Blood (BLOOD GLUCOSE TEST STRIPS) STRP Dispense based on patient and insurance preference. Use to monitor FSBS 4x daily d/t fluctuating blood glucose. Dx: E11.65 150 each 0  . hydrochlorothiazide (HYDRODIURIL) 25 MG tablet Take 1 tablet (25 mg total) by mouth daily. 90 tablet 3  . hydrOXYzine (ATARAX/VISTARIL) 25 MG tablet Take 1 tablet (25 mg total) by mouth 3 (three) times daily as needed. 30 tablet 0  . Insulin Glargine (LANTUS SOLOSTAR) 100 UNIT/ML Solostar Pen INJECT 45 UNITS INTO THE SKIN AT BEDTIME 45 mL 3  . Insulin Pen Needle (B-D UF III MINI PEN NEEDLES) 31G X 5 MM MISC 1 each by Does not apply route daily. CHECK BS QID (FOUR TIMES A DAY) Dx 250.00 30 each 11  . LANCETS ULTRA FINE MISC Dispense based on patient and insurance preference. Use to monitor FSBS 4x daily d/t fluctuating blood glucose. Dx: E11.65 150 each 0  . metFORMIN (GLUCOPHAGE) 1000 MG tablet TAKE 1 TABLET TWICE DAILY 180 tablet 3  . mometasone (ELOCON) 0.1 % ointment Apply topically daily. 45 g 0  . valACYclovir (VALTREX) 1000 MG tablet Take 1 tablet (1,000 mg total) by mouth 3 (three) times daily. 21 tablet 0  . valsartan (DIOVAN) 320 MG tablet Take 1  tablet (320 mg total) by mouth daily. 90 tablet 3   No current facility-administered medications on file prior to visit.   Allergies  Allergen Reactions  . Codeine Itching   Social History   Social History  . Marital Status: Divorced    Spouse Name: N/A  . Number of Children: N/A  . Years of Education: N/A   Occupational History  . Not on file.   Social History Main Topics  . Smoking status: Never Smoker   . Smokeless tobacco: Current User    Types: Snuff  . Alcohol Use: No  . Drug Use: No  . Sexual Activity: Not on file   Other Topics Concern  . Not on file   Social History Narrative      Review of Systems     Objective:   Physical Exam  Cardiovascular:  Normal rate, regular rhythm and normal heart sounds.   Pulmonary/Chest: Effort normal and breath sounds normal. No respiratory distress. She has no wheezes. She has no rales.  Musculoskeletal: Normal range of motion.       Left shoulder: Normal. She exhibits normal range of motion.       Left elbow: Normal. She exhibits normal range of motion.       Cervical back: She exhibits normal range of motion, no tenderness and no bony tenderness.  Skin: No rash noted. No erythema.  Vitals reviewed.         Assessment & Plan:  Hospital discharge follow-up - Plan: Lactic Acid, Plasma  Left arm pain  Anxiety is certainly an option. However I believe the pain might be neuropathic in nature. The pain returns I would proceed with an MRI of the neck. However the present time we will monitor the patient clinically. Her lactic acid was elevated the emergency room and I'm going to repeat that today. If persistently elevated, we may want to discontinue metformin and replace with a better option for this patient

## 2016-04-05 LAB — LACTIC ACID, PLASMA: LACTIC ACID: 30 mg/dL — ABNORMAL HIGH (ref 4–16)

## 2016-04-10 ENCOUNTER — Other Ambulatory Visit: Payer: Self-pay | Admitting: Family Medicine

## 2016-04-10 MED ORDER — SITAGLIPTIN PHOSPHATE 100 MG PO TABS: 100.0000 mg | ORAL_TABLET | Freq: Every day | ORAL | Status: DC

## 2016-04-15 ENCOUNTER — Other Ambulatory Visit: Payer: Self-pay | Admitting: Family Medicine

## 2016-04-17 ENCOUNTER — Telehealth: Payer: Self-pay | Admitting: Family Medicine

## 2016-04-17 NOTE — Telephone Encounter (Signed)
Pt would like to speak with someone about the Januvia that she has been taking. Please call [phone]

## 2016-04-18 NOTE — Telephone Encounter (Signed)
Called and spoke to pt and she states that the Januvia is $145 on her ins and she can not afford this. I told her to continue to take her previous medications until you came back and would address and call her back on Monday.

## 2016-04-22 NOTE — Telephone Encounter (Signed)
Would not resume metformin given lactic acid.  Suggest actos 30 mg poqday instead of januvia.

## 2016-04-25 MED ORDER — PIOGLITAZONE HCL 30 MG PO TABS: 30.0000 mg | ORAL_TABLET | Freq: Every day | ORAL | Status: DC

## 2016-04-25 NOTE — Telephone Encounter (Signed)
Pt called back and aware of provider recommendations and med sent to pharm.

## 2016-04-25 NOTE — Telephone Encounter (Signed)
Tried to call line busy 

## 2016-05-20 ENCOUNTER — Encounter: Payer: Self-pay | Admitting: Family Medicine

## 2016-05-20 ENCOUNTER — Ambulatory Visit (INDEPENDENT_AMBULATORY_CARE_PROVIDER_SITE_OTHER): Payer: Commercial Managed Care - HMO | Admitting: Family Medicine

## 2016-05-20 VITALS — BP 118/68 | HR 82 | Temp 97.9°F | Resp 14 | Ht 65.5 in | Wt 207.0 lb

## 2016-05-20 DIAGNOSIS — R42 Dizziness and giddiness: Secondary | ICD-10-CM

## 2016-05-20 DIAGNOSIS — E872 Acidosis: Secondary | ICD-10-CM

## 2016-05-20 DIAGNOSIS — R7989 Other specified abnormal findings of blood chemistry: Secondary | ICD-10-CM

## 2016-05-20 DIAGNOSIS — Z794 Long term (current) use of insulin: Secondary | ICD-10-CM | POA: Diagnosis not present

## 2016-05-20 DIAGNOSIS — R21 Rash and other nonspecific skin eruption: Secondary | ICD-10-CM | POA: Diagnosis not present

## 2016-05-20 DIAGNOSIS — E119 Type 2 diabetes mellitus without complications: Secondary | ICD-10-CM

## 2016-05-20 LAB — COMPREHENSIVE METABOLIC PANEL
ALT: 16 U/L (ref 6–29)
AST: 15 U/L (ref 10–35)
Albumin: 4.3 g/dL (ref 3.6–5.1)
Alkaline Phosphatase: 76 U/L (ref 33–130)
BUN: 15 mg/dL (ref 7–25)
CALCIUM: 9.5 mg/dL (ref 8.6–10.4)
CHLORIDE: 101 mmol/L (ref 98–110)
CO2: 25 mmol/L (ref 20–31)
Creat: 1.01 mg/dL — ABNORMAL HIGH (ref 0.60–0.93)
GLUCOSE: 168 mg/dL — AB (ref 70–99)
POTASSIUM: 3.9 mmol/L (ref 3.5–5.3)
Sodium: 137 mmol/L (ref 135–146)
Total Bilirubin: 0.8 mg/dL (ref 0.2–1.2)
Total Protein: 7 g/dL (ref 6.1–8.1)

## 2016-05-20 LAB — CBC WITH DIFFERENTIAL/PLATELET
BASOS ABS: 0 {cells}/uL (ref 0–200)
BASOS PCT: 0 %
EOS ABS: 54 {cells}/uL (ref 15–500)
Eosinophils Relative: 1 %
HEMATOCRIT: 40 % (ref 35.0–45.0)
Hemoglobin: 13.1 g/dL (ref 12.0–15.0)
LYMPHS PCT: 54 %
Lymphs Abs: 2916 cells/uL (ref 850–3900)
MCH: 29.3 pg (ref 27.0–33.0)
MCHC: 32.8 g/dL (ref 32.0–36.0)
MCV: 89.5 fL (ref 80.0–100.0)
MONO ABS: 324 {cells}/uL (ref 200–950)
MONOS PCT: 6 %
MPV: 9.4 fL (ref 7.5–12.5)
NEUTROS PCT: 39 %
Neutro Abs: 2106 cells/uL (ref 1500–7800)
PLATELETS: 267 10*3/uL (ref 140–400)
RBC: 4.47 MIL/uL (ref 3.80–5.10)
RDW: 14.2 % (ref 11.0–15.0)
WBC: 5.4 10*3/uL (ref 3.8–10.8)

## 2016-05-20 LAB — HEMOGLOBIN A1C
HEMOGLOBIN A1C: 9.1 % — AB (ref <5.7)
MEAN PLASMA GLUCOSE: 214 mg/dL

## 2016-05-20 MED ORDER — TRIAMCINOLONE ACETONIDE 0.1 % EX CREA: 1.0000 "application " | TOPICAL_CREAM | Freq: Two times a day (BID) | CUTANEOUS | 0 refills | Status: DC

## 2016-05-20 MED ORDER — METHYLPREDNISOLONE ACETATE 40 MG/ML IJ SUSP
40.0000 mg | Freq: Once | INTRAMUSCULAR | Status: AC
  Administered 2016-05-20: 40 mg via INTRAMUSCULAR

## 2016-05-20 MED ORDER — MECLIZINE HCL 12.5 MG PO TABS: 12.5000 mg | ORAL_TABLET | Freq: Three times a day (TID) | ORAL | 0 refills | Status: DC | PRN

## 2016-05-20 NOTE — Patient Instructions (Signed)
Apply steroid cream twice a day  Take meclizine for itching We will call with lab results\ F/U pending results

## 2016-05-20 NOTE — Progress Notes (Signed)
Orthostatic Blood Pressure:  Lying: 122/ 68  Sitting: 118/ 60  Standing: 118/ 58

## 2016-05-20 NOTE — Progress Notes (Signed)
Patient ID: Amanda Riddle, female   DOB: April 07, 1935, 81 y.o.   MRN: [id number]     Subjective:    Patient ID: Amanda Riddle, female    DOB: 21-Jul-1935, 81 y.o.   MRN: [id number]  Patient presents for Dizziness (x1 day- reports that she woke up with intermittent dizziness) and Skin Irritation (x2 days- red areas to upper thighs- states that she thinks it's bug bites) Issue here dizziness is started yesterday when she got up from bed she moves her head quickly and got a swimmy feeling felt like things were going around in circles to make her a little nauseous and she had to hold onto the wall. It subsided some that continues to reoccur. She denies any headache denies any chest pain or shortness of breath. Her recent new medication is Actos as she cannot afford the Januvia.  She also notes that yesterday she also was bitten by something she saw one bug bite and started scratching at it now she has red spots that have popped up in between both eyes she lives alone and therefore no sick contacts. She's not noted any bed bugs. She does not have any bites anywhere else on her body. She denies any fever denies any drainage from the lesions.     Review Of Systems:  GEN- denies fatigue, fever, weight loss,weakness, recent illness HEENT- denies eye drainage, change in vision, nasal discharge, CVS- denies chest pain, palpitations RESP- denies SOB, cough, wheeze ABD- denies N/V, change in stools, abd pain GU- denies dysuria, hematuria, dribbling, incontinence MSK- denies joint pain, muscle aches, injury Neuro- denies headache, +dizziness, syncope, seizure activity       Objective:    BP 118/68 (BP Location: Right Arm, Patient Position: Sitting, Cuff Size: Large)   Pulse 82   Temp 97.9 F (36.6 C) (Oral)   Resp 14   Ht 5' 5.5" (1.664 m)   Wt 207 lb (93.9 kg)   BMI 33.92 kg/m  GEN- NAD, alert and oriented x3, non toxic well appearing  HEENT- PERRL, EOMI, non injected sclera, pink conjunctiva,  MMM, oropharynx clear,unable to get clear fundus with small pupils  Neck- Supple, no bruit  CVS- RRR, no murmur RESP-CTAB Skin- left thigh- 2x3inch area of erythema with scab at center, multiple erythematous macular lesions scattered on inner bilat thigh, +excoriations, non blanching  Neuro-CNII-XII intact no deficits   EXT- No edema Pulses- Radial, DP- 2+        Assessment & Plan:      Problem List Items Addressed This Visit    DM (diabetes mellitus) ([place])   Relevant Orders   Comprehensive metabolic panel   Hemoglobin A1c    Other Visit Diagnoses    Vertigo    -  Primary   I doubt vertigo and rash connected at this time based on location. Will treat with meclizine,no red flags on exam   Rash       unclear cause appears insect bite but spreading, given depo medrol topical cream   Relevant Medications   methylPREDNISolone acetate (DEPO-MEDROL) injection 40 mg (Completed)   Other Relevant Orders   CBC with Differential/Platelet   Elevated lactic acid level       recheck lactic acid, will also recheck A1C  She asked that he has a 90 day supply of the Januvia at home. The pharmacy will not accept this back advised her that when she runs out of her Actos that she can take up to 90 day supply  as she did buy it and otherwise we would have her throat away. She can then return to Actos afterwards    Relevant Orders   Comprehensive metabolic panel   Lactic Acid, Plasma      Note: This dictation was prepared with Dragon dictation along with smaller phrase technology. Any transcriptional errors that result from this process are unintentional.

## 2016-05-22 ENCOUNTER — Other Ambulatory Visit: Payer: Self-pay | Admitting: *Deleted

## 2016-05-22 LAB — LACTIC ACID, PLASMA: LACTIC ACID: 24 mg/dL — ABNORMAL HIGH (ref 4–16)

## 2016-05-22 MED ORDER — SITAGLIPTIN PHOSPHATE 100 MG PO TABS: 100.0000 mg | ORAL_TABLET | Freq: Every day | ORAL | Status: DC

## 2016-05-22 MED ORDER — INSULIN GLARGINE 100 UNIT/ML SOLOSTAR PEN: PEN_INJECTOR | SUBCUTANEOUS | 3 refills | Status: DC

## 2016-05-28 ENCOUNTER — Encounter: Payer: Self-pay | Admitting: Family Medicine

## 2016-05-28 ENCOUNTER — Ambulatory Visit (INDEPENDENT_AMBULATORY_CARE_PROVIDER_SITE_OTHER): Payer: Commercial Managed Care - HMO | Admitting: Family Medicine

## 2016-05-28 VITALS — BP 118/76 | HR 63 | Resp 18 | Wt 215.0 lb

## 2016-05-28 DIAGNOSIS — Z794 Long term (current) use of insulin: Secondary | ICD-10-CM | POA: Diagnosis not present

## 2016-05-28 DIAGNOSIS — B029 Zoster without complications: Secondary | ICD-10-CM

## 2016-05-28 DIAGNOSIS — E785 Hyperlipidemia, unspecified: Secondary | ICD-10-CM | POA: Diagnosis not present

## 2016-05-28 DIAGNOSIS — E119 Type 2 diabetes mellitus without complications: Secondary | ICD-10-CM | POA: Diagnosis not present

## 2016-05-28 DIAGNOSIS — I10 Essential (primary) hypertension: Secondary | ICD-10-CM

## 2016-05-28 MED ORDER — MECLIZINE HCL 12.5 MG PO TABS: 12.5000 mg | ORAL_TABLET | Freq: Three times a day (TID) | ORAL | 0 refills | Status: DC | PRN

## 2016-05-28 MED ORDER — VALACYCLOVIR HCL 1 G PO TABS: 1000.0000 mg | ORAL_TABLET | Freq: Three times a day (TID) | ORAL | 0 refills | Status: DC

## 2016-05-28 MED ORDER — PIOGLITAZONE HCL 30 MG PO TABS: 30.0000 mg | ORAL_TABLET | Freq: Every day | ORAL | 3 refills | Status: DC

## 2016-05-28 NOTE — Progress Notes (Signed)
Subjective:    Patient ID: Amanda Riddle, female    DOB: 1935-10-09, 81 y.o.   MRN: [id number]  HPI  04/02/16 Awoke the morning of June 8 with severe pain in her left arm from her shoulder to her fingertips. The pain was intense. It was unrelated to movement or position. She denied any weakness or numbness but intense deep aching pain from her shoulder to her fingers. Was seen in the emergency room. ACS was ruled out. Chest x-ray was normal. Lactic acid was checked and was elevated at 4.6. Per the patient's report she was diagnosed with stress reaction due to the death of 4 family members in a short period of time. Today she is pain-free. She denies any chest pain shortness of breath or dyspnea on exertion. She denies any abdominal pain. There is no further pain in her left arm. There is no pain with range of motion in her neck or her shoulder or her elbow.  At that time, my plan was: Anxiety is certainly an option. However I believe the pain might be neuropathic in nature. The pain returns I would proceed with an MRI of the neck. However the present time we will monitor the patient clinically. Her lactic acid was elevated the emergency room and I'm going to repeat that today. If persistently elevated, we may want to discontinue metformin and replace with a better option for this patient  05/28/16 After the last office visit, the patient's repeat lactic acid level is elevated so I took her off metformin. I initially placed her on Januvia. However this was going to cost her $145 and was too expensive. Therefore I switched her to Actos 30 mg a day. She's been on Actos 30 mg a day since July 4. After discontinuing her metformin, her fasting blood sugars in July range between 120 and 160. However in August over the last 2 weeks, her fasting blood sugars have fallen between 88 and 120 as the Actos is taking effect. She denies any swelling in her legs or shortness of breath or chest pain. Unfortunate she has a  painful blistering rash on the left side of her neck in a dermatomal pattern that appears to be shingles. It is numerous erythematous papules coalescing into patches in a linear grouping from her neck onto her left shoulder. Past Medical History:  Diagnosis Date  . Diabetes mellitus   . Hypercholesteremia   . Hypertension    Past Surgical History:  Procedure Laterality Date  . ABDOMINAL HYSTERECTOMY  1986  . TONSILLECTOMY  1950   Current Outpatient Prescriptions on File Prior to Visit  Medication Sig Dispense Refill  . albuterol (PROVENTIL HFA;VENTOLIN HFA) 108 (90 BASE) MCG/ACT inhaler Inhale 2 puffs into the lungs every 6 (six) hours as needed. Shortness of breath 3 Inhaler 2  . amLODipine (NORVASC) 5 MG tablet TAKE 1 TABLET EVERY DAY 90 tablet 3  . aspirin EC 81 MG tablet Take 81 mg by mouth at bedtime.    Marland Kitchen atorvastatin (LIPITOR) 40 MG tablet TAKE 1 TABLET EVERY DAY 90 tablet 3  . Blood Glucose Monitoring Suppl (BLOOD GLUCOSE MONITOR SYSTEM) w/Device KIT Dispense based on patient and insurance preference. Use to monitor FSBS 4x daily d/t fluctuating blood glucose. Dx: E11.65 1 each 1  . Cyanocobalamin (VITAMIN B 12 PO) Take 1 tablet by mouth at bedtime.     . Flaxseed, Linseed, (FLAXSEED OIL PO) Take 1 capsule by mouth 2 (two) times daily.     Marland Kitchen  Glucosamine Sulfate-MSM (MSM-GLUCOSAMINE PO) Take 1 capsule by mouth 2 (two) times daily.    . Glucose Blood (BLOOD GLUCOSE TEST STRIPS) STRP Dispense based on patient and insurance preference. Use to monitor FSBS 4x daily d/t fluctuating blood glucose. Dx: E11.65 150 each 0  . hydrochlorothiazide (HYDRODIURIL) 25 MG tablet Take 1 tablet (25 mg total) by mouth daily. 90 tablet 3  . hydrOXYzine (ATARAX/VISTARIL) 25 MG tablet Take 1 tablet (25 mg total) by mouth 3 (three) times daily as needed. 30 tablet 0  . Insulin Glargine (LANTUS SOLOSTAR) 100 UNIT/ML Solostar Pen INJECT 45 UNITS INTO THE SKIN AT BEDTIME 48 mL 3  . Insulin Pen Needle (B-D UF  III MINI PEN NEEDLES) 31G X 5 MM MISC 1 each by Does not apply route daily. CHECK BS QID (FOUR TIMES A DAY) Dx 250.00 30 each 11  . LANCETS ULTRA FINE MISC Dispense based on patient and insurance preference. Use to monitor FSBS 4x daily d/t fluctuating blood glucose. Dx: E11.65 150 each 0  . mometasone (ELOCON) 0.1 % ointment Apply topically daily. 45 g 0  . sitaGLIPtin (JANUVIA) 100 MG tablet Take 1 tablet (100 mg total) by mouth daily.    Marland Kitchen triamcinolone cream (KENALOG) 0.1 % Apply 1 application topically 2 (two) times daily. 30 g 0  . valsartan (DIOVAN) 320 MG tablet TAKE 1 TABLET (320 MG TOTAL) BY MOUTH DAILY. 90 tablet 3  . valACYclovir (VALTREX) 1000 MG tablet Take 1 tablet (1,000 mg total) by mouth 3 (three) times daily. 21 tablet 0   No current facility-administered medications on file prior to visit.    Allergies  Allergen Reactions  . Codeine Itching   Social History   Social History  . Marital status: Divorced    Spouse name: N/A  . Number of children: N/A  . Years of education: N/A   Occupational History  . Not on file.   Social History Main Topics  . Smoking status: Never Smoker  . Smokeless tobacco: Current User    Types: Snuff  . Alcohol use No  . Drug use: No  . Sexual activity: Not on file   Other Topics Concern  . Not on file   Social History Narrative  . No narrative on file      Review of Systems     Objective:   Physical Exam  Cardiovascular: Normal rate, regular rhythm and normal heart sounds.   Pulmonary/Chest: Effort normal and breath sounds normal. No respiratory distress. She has no wheezes. She has no rales.  Musculoskeletal: Normal range of motion.       Left shoulder: Normal. She exhibits normal range of motion.       Left elbow: Normal. She exhibits normal range of motion.       Cervical back: She exhibits normal range of motion, no tenderness and no bony tenderness.  Skin: No rash noted. No erythema.  Vitals reviewed.           Assessment & Plan:  Shingles - Plan: valACYclovir (VALTREX) 1000 MG tablet  Essential hypertension  Controlled type 2 diabetes mellitus without complication, with long-term current use of insulin (HCC)  HLD (hyperlipidemia)  Discontinue Januvia. The patient will only be on Lantus and Actos 30 mg a day. Recheck hemoglobin A1c in 3 months in November. However as the Actos has taken full effect, her fasting blood sugars look excellent so anticipate that her A1c will be at goal in November. Begin Valtrex 1 g by mouth 3  times a day for 7 days for shingles. Blood pressure is well controlled. I'll make no changes at the present time. I will not check fasting lab work today as her medications are in flux. I will check fasting lab work in November.

## 2016-06-20 ENCOUNTER — Telehealth: Payer: Self-pay | Admitting: Family Medicine

## 2016-06-20 NOTE — Telephone Encounter (Signed)
Amanda Riddle called confused about having an appointment for September 9th. She said she thought Dr. Dennard Schaumann didn't want her to come in again until November. Please confirm with the patient and call her back.   Pt's ph# [phone]   Thank you.

## 2016-06-20 NOTE — Telephone Encounter (Signed)
Appointment for 06/25/16 was scheduled in July 2017.  Patient has been seen since then and was advised to F/U in Nov.   Appointment moved to Nov 2017.

## 2016-06-25 ENCOUNTER — Ambulatory Visit: Payer: Commercial Managed Care - HMO | Admitting: Family Medicine

## 2016-07-23 DIAGNOSIS — E113292 Type 2 diabetes mellitus with mild nonproliferative diabetic retinopathy without macular edema, left eye: Secondary | ICD-10-CM | POA: Diagnosis not present

## 2016-07-23 DIAGNOSIS — Z9842 Cataract extraction status, left eye: Secondary | ICD-10-CM | POA: Diagnosis not present

## 2016-07-23 DIAGNOSIS — H25811 Combined forms of age-related cataract, right eye: Secondary | ICD-10-CM | POA: Diagnosis not present

## 2016-07-23 DIAGNOSIS — Z961 Presence of intraocular lens: Secondary | ICD-10-CM | POA: Diagnosis not present

## 2016-07-23 LAB — HM DIABETES EYE EXAM

## 2016-07-25 ENCOUNTER — Other Ambulatory Visit: Payer: Self-pay | Admitting: Family Medicine

## 2016-08-08 ENCOUNTER — Encounter: Payer: Self-pay | Admitting: Family Medicine

## 2016-08-26 ENCOUNTER — Ambulatory Visit (INDEPENDENT_AMBULATORY_CARE_PROVIDER_SITE_OTHER): Payer: Commercial Managed Care - HMO | Admitting: Family Medicine

## 2016-08-26 ENCOUNTER — Encounter: Payer: Self-pay | Admitting: Family Medicine

## 2016-08-26 VITALS — BP 134/78 | HR 64 | Temp 98.0°F | Resp 16 | Ht 65.5 in | Wt 226.0 lb

## 2016-08-26 DIAGNOSIS — E7800 Pure hypercholesterolemia, unspecified: Secondary | ICD-10-CM

## 2016-08-26 DIAGNOSIS — Z794 Long term (current) use of insulin: Secondary | ICD-10-CM | POA: Diagnosis not present

## 2016-08-26 DIAGNOSIS — E119 Type 2 diabetes mellitus without complications: Secondary | ICD-10-CM

## 2016-08-26 DIAGNOSIS — Z23 Encounter for immunization: Secondary | ICD-10-CM | POA: Diagnosis not present

## 2016-08-26 DIAGNOSIS — I10 Essential (primary) hypertension: Secondary | ICD-10-CM | POA: Diagnosis not present

## 2016-08-26 LAB — COMPLETE METABOLIC PANEL WITH GFR
ALBUMIN: 4.1 g/dL (ref 3.6–5.1)
ALK PHOS: 77 U/L (ref 33–130)
ALT: 15 U/L (ref 6–29)
AST: 19 U/L (ref 10–35)
BILIRUBIN TOTAL: 0.8 mg/dL (ref 0.2–1.2)
BUN: 14 mg/dL (ref 7–25)
CALCIUM: 9.7 mg/dL (ref 8.6–10.4)
CO2: 27 mmol/L (ref 20–31)
Chloride: 103 mmol/L (ref 98–110)
Creat: 1.02 mg/dL — ABNORMAL HIGH (ref 0.60–0.93)
GFR, EST AFRICAN AMERICAN: 60 mL/min (ref >=60)
GFR, EST NON AFRICAN AMERICAN: 52 mL/min — AB (ref >=60)
GLUCOSE: 112 mg/dL — AB (ref 70–99)
POTASSIUM: 4.6 mmol/L (ref 3.5–5.3)
SODIUM: 142 mmol/L (ref 135–146)
TOTAL PROTEIN: 6.7 g/dL (ref 6.1–8.1)

## 2016-08-26 LAB — CBC WITH DIFFERENTIAL/PLATELET
BASOS ABS: 0 {cells}/uL (ref 0–200)
Basophils Relative: 0 %
EOS ABS: 120 {cells}/uL (ref 15–500)
Eosinophils Relative: 2 %
HEMATOCRIT: 38.1 % (ref 35.0–45.0)
HEMOGLOBIN: 12.3 g/dL (ref 12.0–15.0)
LYMPHS ABS: 3120 {cells}/uL (ref 850–3900)
Lymphocytes Relative: 52 %
MCH: 29.6 pg (ref 27.0–33.0)
MCHC: 32.3 g/dL (ref 32.0–36.0)
MCV: 91.8 fL (ref 80.0–100.0)
MONO ABS: 420 {cells}/uL (ref 200–950)
MPV: 9.2 fL (ref 7.5–12.5)
Monocytes Relative: 7 %
NEUTROS ABS: 2340 {cells}/uL (ref 1500–7800)
NEUTROS PCT: 39 %
Platelets: 275 10*3/uL (ref 140–400)
RBC: 4.15 MIL/uL (ref 3.80–5.10)
RDW: 14.3 % (ref 11.0–15.0)
WBC: 6 10*3/uL (ref 3.8–10.8)

## 2016-08-26 LAB — HEMOGLOBIN A1C
HEMOGLOBIN A1C: 7.9 % — AB (ref <5.7)
Mean Plasma Glucose: 180 mg/dL

## 2016-08-26 NOTE — Progress Notes (Signed)
Subjective:    Patient ID: Amanda Riddle, female    DOB: January 15, 1935, 81 y.o.   MRN: [id number]  HPI Patient is 81 y/o AAF here for follow up.  Has DMII on Lantus 45 units daily and actos 30 mg poqday.  FBS Are 70-160. However the vast majority of her blood sugars are less than 130 and excellent. She has very few fasting blood sugars under 80. Overall she is doing very well with her sugar control however she is concerned about Actos and the risk of bladder cancer. We have ultimately chosen Actos due to cost. She does not have any peripheral edema. She has no history of heart failure. Her blood pressure today is well controlled. She is not having any chest pain or shortness of breath or dyspnea on exertion. She is due for a flu shot. Diabetic eye exam was performed 3 weeks ago and was normal. Diabetic foot exam is performed today and is outstanding with no evidence of neuropathy. She denies any myalgias or right upper quadrant pain on cholesterol medication Past Medical History:  Diagnosis Date  . Diabetes mellitus   . Hypercholesteremia   . Hypertension    Past Surgical History:  Procedure Laterality Date  . ABDOMINAL HYSTERECTOMY  1986  . TONSILLECTOMY  1950   Current Outpatient Prescriptions on File Prior to Visit  Medication Sig Dispense Refill  . albuterol (PROVENTIL HFA;VENTOLIN HFA) 108 (90 BASE) MCG/ACT inhaler Inhale 2 puffs into the lungs every 6 (six) hours as needed. Shortness of breath 3 Inhaler 2  . amLODipine (NORVASC) 5 MG tablet TAKE 1 TABLET EVERY DAY 90 tablet 3  . aspirin EC 81 MG tablet Take 81 mg by mouth at bedtime.    Marland Kitchen atorvastatin (LIPITOR) 40 MG tablet TAKE 1 TABLET EVERY DAY 90 tablet 3  . Blood Glucose Monitoring Suppl (BLOOD GLUCOSE MONITOR SYSTEM) w/Device KIT Dispense based on patient and insurance preference. Use to monitor FSBS 4x daily d/t fluctuating blood glucose. Dx: E11.65 1 each 1  . Cyanocobalamin (VITAMIN B 12 PO) Take 1 tablet by mouth at bedtime.      . Flaxseed, Linseed, (FLAXSEED OIL PO) Take 1 capsule by mouth 2 (two) times daily.     . Glucosamine Sulfate-MSM (MSM-GLUCOSAMINE PO) Take 1 capsule by mouth 2 (two) times daily.    . Glucose Blood (BLOOD GLUCOSE TEST STRIPS) STRP Dispense based on patient and insurance preference. Use to monitor FSBS 4x daily d/t fluctuating blood glucose. Dx: E11.65 150 each 0  . hydrochlorothiazide (HYDRODIURIL) 25 MG tablet Take 1 tablet (25 mg total) by mouth daily. 90 tablet 3  . hydrOXYzine (ATARAX/VISTARIL) 25 MG tablet Take 1 tablet (25 mg total) by mouth 3 (three) times daily as needed. 30 tablet 0  . Insulin Glargine (LANTUS SOLOSTAR) 100 UNIT/ML Solostar Pen INJECT 45 UNITS INTO THE SKIN AT BEDTIME 48 mL 3  . Insulin Pen Needle (B-D UF III MINI PEN NEEDLES) 31G X 5 MM MISC 1 each by Does not apply route daily. CHECK BS QID (FOUR TIMES A DAY) Dx 250.00 30 each 11  . LANCETS ULTRA FINE MISC Dispense based on patient and insurance preference. Use to monitor FSBS 4x daily d/t fluctuating blood glucose. Dx: E11.65 150 each 0  . meclizine (ANTIVERT) 12.5 MG tablet TAKE 1 TABLET BY MOUTH 3 TIMES DAILY AS NEEDED FOR DIZZINESS. 20 tablet 0  . mometasone (ELOCON) 0.1 % ointment Apply topically daily. 45 g 0  . pioglitazone (ACTOS) 30 MG  tablet Take 1 tablet (30 mg total) by mouth daily. 90 tablet 3  . triamcinolone cream (KENALOG) 0.1 % Apply 1 application topically 2 (two) times daily. 30 g 0  . valACYclovir (VALTREX) 1000 MG tablet Take 1 tablet (1,000 mg total) by mouth 3 (three) times daily. 21 tablet 0  . valsartan (DIOVAN) 320 MG tablet TAKE 1 TABLET (320 MG TOTAL) BY MOUTH DAILY. 90 tablet 3   No current facility-administered medications on file prior to visit.    Allergies  Allergen Reactions  . Codeine Itching   Social History   Social History  . Marital status: Divorced    Spouse name: N/A  . Number of children: N/A  . Years of education: N/A   Occupational History  . Not on file.    Social History Main Topics  . Smoking status: Never Smoker  . Smokeless tobacco: Current User    Types: Snuff  . Alcohol use No  . Drug use: No  . Sexual activity: Not on file   Other Topics Concern  . Not on file   Social History Narrative  . No narrative on file      Review of Systems  All other systems reviewed and are negative.      Objective:   Physical Exam  Constitutional: She is oriented to person, place, and time. She appears well-developed and well-nourished.  Neck: Neck supple. No JVD present. No thyromegaly present.  Cardiovascular: Normal rate, regular rhythm, normal heart sounds and intact distal pulses.   No murmur heard. Pulmonary/Chest: Effort normal and breath sounds normal. No respiratory distress. She has no wheezes. She has no rales.  Abdominal: Soft. Bowel sounds are normal. She exhibits no distension and no mass. There is no tenderness. There is no rebound and no guarding.  Musculoskeletal: She exhibits no edema.  Lymphadenopathy:    She has no cervical adenopathy.  Neurological: She is alert and oriented to person, place, and time.  Skin: No rash noted.  Vitals reviewed.         Assessment & Plan:  Controlled type 2 diabetes mellitus without complication, with long-term current use of insulin ([place]) - Plan: CBC with Differential/Platelet, COMPLETE METABOLIC PANEL WITH GFR, Hemoglobin A1c, Microalbumin, urine  Pure hypercholesterolemia  Essential hypertension  The patient's exam is completely normal. Her blood pressure today is excellent. She will receive her flu shot. Eye exam and foot exam are up-to-date. Reported sugar values are outstanding. I will check an A1c, fasting lipid panel, and urine microalbumin. We had a long discussion about switching from Actos to Victoza. I believe the toes will be a better option because it would help her with weight loss, reduce her risk of cardiovascular death, and does not pose the risk of bladder  cancer. She will inquire as to the price and tell me if she desires to switch

## 2016-08-26 NOTE — Addendum Note (Signed)
Addended by: Shary Decamp B on: 08/26/2016 09:34 AM   Modules accepted: Orders

## 2016-08-27 LAB — MICROALBUMIN, URINE: Microalb, Ur: 0.2 mg/dL

## 2016-08-28 ENCOUNTER — Telehealth: Payer: Self-pay | Admitting: Family Medicine

## 2016-08-28 NOTE — Telephone Encounter (Signed)
Patient aware of results and states that she is in the doughnut hole and will not be able to afford the victoza. Recommendations?

## 2016-08-28 NOTE — Telephone Encounter (Signed)
-----   Message from Susy Frizzle, MD sent at 08/27/2016  7:10 AM EST ----- A1c is slightly high.  I would add victoza to her insulin, 1.2 mg daily.

## 2016-08-29 NOTE — Telephone Encounter (Signed)
Add metformin 850 mg pobid.  Start with 850 once a day and increase to twice a day in 2 weeks if no diarrhea.  Recheck in 3 months.

## 2016-08-30 NOTE — Telephone Encounter (Signed)
Yes

## 2016-08-30 NOTE — Telephone Encounter (Signed)
Patient aware of providers recommendations and states that she has a lot of Metformin 1000mg  (like a years worth) left from where you took her off of it when you started insulin - ok to restart that instead of the 850mg ?

## 2016-08-30 NOTE — Telephone Encounter (Signed)
Pt aware of med changes via vm

## 2016-09-24 ENCOUNTER — Telehealth: Payer: Self-pay | Admitting: Family Medicine

## 2016-09-24 NOTE — Telephone Encounter (Signed)
Craig Staggers # [id number] for [facility] with Dr Jorja Loa, eye care provider, for 6 visits from10/3/17 - 01/19/17  Dx E11.65 and [id number] Vitreous degen

## 2016-11-04 ENCOUNTER — Telehealth: Payer: Self-pay | Admitting: Family Medicine

## 2016-11-04 MED ORDER — BLOOD GLUCOSE TEST VI STRP: ORAL_STRIP | 11 refills | Status: DC

## 2016-11-04 MED ORDER — BLOOD GLUCOSE MONITOR SYSTEM W/DEVICE KIT: PACK | 1 refills | Status: DC

## 2016-11-04 NOTE — Telephone Encounter (Signed)
Received a rx from [facility] requesting new meter as well. Called pt and she said just to [facility] to [facility]. [facility]eter [facility] strips [facility] to [facility].

## 2016-11-04 NOTE — Telephone Encounter (Signed)
[facility]  Patient calling to get refill on on accuvue test strips, this will be going to this pharmacy which is new for the patient

## 2016-11-12 ENCOUNTER — Telehealth: Payer: Self-pay | Admitting: Family Medicine

## 2016-11-12 NOTE — Telephone Encounter (Signed)
Pharmacy states that they cannot refill her test strip, because a form needs to be filled out for her medicare in order for them to be refilled. pts daughter wants to know if there are samples that we can give them or if the form can be filled out so she can pick them up.

## 2016-11-15 NOTE — Telephone Encounter (Addendum)
Called and spoke to pharmacy and they need DWO filled out in order to fill her dm supplies. They will fax form.

## 2016-11-18 NOTE — Telephone Encounter (Signed)
Form filled out and faxed 

## 2016-11-21 DIAGNOSIS — R69 Illness, unspecified: Secondary | ICD-10-CM | POA: Diagnosis not present

## 2016-11-26 ENCOUNTER — Other Ambulatory Visit: Payer: Self-pay | Admitting: Family Medicine

## 2016-11-26 DIAGNOSIS — Z1231 Encounter for screening mammogram for malignant neoplasm of breast: Secondary | ICD-10-CM

## 2016-12-31 ENCOUNTER — Ambulatory Visit: Payer: Medicare HMO | Admitting: Family Medicine

## 2017-01-02 ENCOUNTER — Ambulatory Visit
Admission: RE | Admit: 2017-01-02 | Discharge: 2017-01-02 | Disposition: A | Discharge status: Home / Self Care | Payer: Medicare HMO | Source: Ambulatory Visit | Attending: Family Medicine | Admitting: Family Medicine

## 2017-01-02 DIAGNOSIS — Z1231 Encounter for screening mammogram for malignant neoplasm of breast: Secondary | ICD-10-CM

## 2017-01-07 ENCOUNTER — Other Ambulatory Visit: Payer: Medicare HMO

## 2017-01-07 ENCOUNTER — Ambulatory Visit (INDEPENDENT_AMBULATORY_CARE_PROVIDER_SITE_OTHER): Payer: Medicare HMO | Admitting: Family Medicine

## 2017-01-07 VITALS — BP 120/68 | HR 82 | Temp 98.0°F | Resp 16 | Ht 65.5 in | Wt 228.0 lb

## 2017-01-07 DIAGNOSIS — Z794 Long term (current) use of insulin: Secondary | ICD-10-CM | POA: Diagnosis not present

## 2017-01-07 DIAGNOSIS — E7800 Pure hypercholesterolemia, unspecified: Secondary | ICD-10-CM

## 2017-01-07 DIAGNOSIS — E1100 Type 2 diabetes mellitus with hyperosmolarity without nonketotic hyperglycemic-hyperosmolar coma (NKHHC): Secondary | ICD-10-CM | POA: Diagnosis not present

## 2017-01-07 DIAGNOSIS — I10 Essential (primary) hypertension: Secondary | ICD-10-CM | POA: Diagnosis not present

## 2017-01-07 LAB — CBC WITH DIFFERENTIAL/PLATELET
Basophils Absolute: 59 cells/uL (ref 0–200)
Basophils Relative: 1 %
Eosinophils Absolute: 118 cells/uL (ref 15–500)
Eosinophils Relative: 2 %
HCT: 36.4 % (ref 35.0–45.0)
HEMOGLOBIN: 11.8 g/dL — AB (ref 12.0–15.0)
Lymphocytes Relative: 50 %
Lymphs Abs: 2950 cells/uL (ref 850–3900)
MCH: 29 pg (ref 27.0–33.0)
MCHC: 32.4 g/dL (ref 32.0–36.0)
MCV: 89.4 fL (ref 80.0–100.0)
MONO ABS: 354 {cells}/uL (ref 200–950)
MPV: 8.9 fL (ref 7.5–12.5)
Monocytes Relative: 6 %
NEUTROS PCT: 41 %
Neutro Abs: 2419 cells/uL (ref 1500–7800)
Platelets: 250 10*3/uL (ref 140–400)
RBC: 4.07 MIL/uL (ref 3.80–5.10)
RDW: 14.6 % (ref 11.0–15.0)
WBC: 5.9 10*3/uL (ref 3.8–10.8)

## 2017-01-07 NOTE — Progress Notes (Signed)
Subjective:    Patient ID: Amanda Riddle, female    DOB: 1935-06-13, 82 y.o.   MRN: [id number]  HPI Patient is 82 y/o AAF here for follow up.  Has DMII on Lantus 45 units daily and actos 30 mg poqday.  FBS are higher than before 100-180. However the vast majority of her blood sugars are less than 130 and excellent.  Her blood pressure today is well controlled. She is not having any chest pain or shortness of breath or dyspnea on exertion.  Diabetic eye exam was performed 5 months ago and was normal. Diabetic foot exam is performed today and is outstanding with no evidence of neuropathy. She denies any myalgias or right upper quadrant pain on cholesterol medication Past Medical History:  Diagnosis Date  . Diabetes mellitus   . Hypercholesteremia   . Hypertension    Past Surgical History:  Procedure Laterality Date  . ABDOMINAL HYSTERECTOMY  1986  . TONSILLECTOMY  1950   Current Outpatient Prescriptions on File Prior to Visit  Medication Sig Dispense Refill  . albuterol (PROVENTIL HFA;VENTOLIN HFA) 108 (90 BASE) MCG/ACT inhaler Inhale 2 puffs into the lungs every 6 (six) hours as needed. Shortness of breath 3 Inhaler 2  . amLODipine (NORVASC) 5 MG tablet TAKE 1 TABLET EVERY DAY 90 tablet 3  . aspirin EC 81 MG tablet Take 81 mg by mouth at bedtime.    Marland Kitchen atorvastatin (LIPITOR) 40 MG tablet TAKE 1 TABLET EVERY DAY 90 tablet 3  . Blood Glucose Monitoring Suppl (BLOOD GLUCOSE MONITOR SYSTEM) w/Device KIT Dispense one touch per pts insurance. Checks BS 4x daily d/t fluctuating blood glucose. Dx: E11.65 1 each 1  . Cyanocobalamin (VITAMIN B 12 PO) Take 1 tablet by mouth at bedtime.     . Flaxseed, Linseed, (FLAXSEED OIL PO) Take 1 capsule by mouth 2 (two) times daily.     . Glucosamine Sulfate-MSM (MSM-GLUCOSAMINE PO) Take 1 capsule by mouth 2 (two) times daily.    . Glucose Blood (BLOOD GLUCOSE TEST STRIPS) STRP Dispense one touch strips. Checks BS 4x daily d/t fluctuating blood glucose. Dx:  E11.65 150 each 11  . hydrochlorothiazide (HYDRODIURIL) 25 MG tablet Take 1 tablet (25 mg total) by mouth daily. 90 tablet 3  . Insulin Glargine (LANTUS SOLOSTAR) 100 UNIT/ML Solostar Pen INJECT 45 UNITS INTO THE SKIN AT BEDTIME 48 mL 3  . Insulin Pen Needle (B-D UF III MINI PEN NEEDLES) 31G X 5 MM MISC 1 each by Does not apply route daily. CHECK BS QID (FOUR TIMES A DAY) Dx 250.00 30 each 11  . LANCETS ULTRA FINE MISC Dispense based on patient and insurance preference. Use to monitor FSBS 4x daily d/t fluctuating blood glucose. Dx: E11.65 150 each 0  . metFORMIN (GLUCOPHAGE) 1000 MG tablet Take 1,000 mg by mouth 2 (two) times daily with a meal.    . pioglitazone (ACTOS) 30 MG tablet Take 1 tablet (30 mg total) by mouth daily. 90 tablet 3  . valsartan (DIOVAN) 320 MG tablet TAKE 1 TABLET (320 MG TOTAL) BY MOUTH DAILY. 90 tablet 3   No current facility-administered medications on file prior to visit.    Allergies  Allergen Reactions  . Codeine Itching   Social History   Social History  . Marital status: Divorced    Spouse name: N/A  . Number of children: N/A  . Years of education: N/A   Occupational History  . Not on file.   Social History Main Topics  .  Smoking status: Never Smoker  . Smokeless tobacco: Current User    Types: Snuff  . Alcohol use No  . Drug use: No  . Sexual activity: Not on file   Other Topics Concern  . Not on file   Social History Narrative  . No narrative on file      Review of Systems  All other systems reviewed and are negative.      Objective:   Physical Exam  Constitutional: She is oriented to person, place, and time. She appears well-developed and well-nourished.  Neck: Neck supple. No JVD present. No thyromegaly present.  Cardiovascular: Normal rate, regular rhythm, normal heart sounds and intact distal pulses.   No murmur heard. Pulmonary/Chest: Effort normal and breath sounds normal. No respiratory distress. She has no wheezes. She  has no rales.  Abdominal: Soft. Bowel sounds are normal. She exhibits no distension and no mass. There is no tenderness. There is no rebound and no guarding.  Musculoskeletal: She exhibits no edema.  Lymphadenopathy:    She has no cervical adenopathy.  Neurological: She is alert and oriented to person, place, and time.  Skin: No rash noted.  Vitals reviewed.         Assessment & Plan:  Uncontrolled type 2 diabetes mellitus with hyperosmolarity without coma, with long-term current use of insulin ([facility]) - Plan: CBC with Differential/Platelet, COMPLETE METABOLIC PANEL WITH GFR, Lipid panel, Microalbumin, urine, Hemoglobin A1c  Essential hypertension  Pure hypercholesterolemia  The patient's exam is completely normal. Her blood pressure today is excellent. Eye exam and foot exam are up-to-date. Reported sugar values are slightly elevated. I will check an A1c, fasting lipid panel, and urine microalbumin. If hemoglobin A1c is greater than 8, I would increase Lantus until fasting blood sugars are less than 130. Given her advanced age, I am comfortable with a hemoglobin A1c between 7 and 8. I will also check a fasting lipid panel. Her goal LDL cholesterol is less than 100.

## 2017-01-08 LAB — COMPLETE METABOLIC PANEL WITH GFR
ALBUMIN: 3.9 g/dL (ref 3.6–5.1)
ALT: 9 U/L (ref 6–29)
AST: 14 U/L (ref 10–35)
Alkaline Phosphatase: 71 U/L (ref 33–130)
BILIRUBIN TOTAL: 0.7 mg/dL (ref 0.2–1.2)
BUN: 13 mg/dL (ref 7–25)
CALCIUM: 9.4 mg/dL (ref 8.6–10.4)
CHLORIDE: 104 mmol/L (ref 98–110)
CO2: 24 mmol/L (ref 20–31)
CREATININE: 1.18 mg/dL — AB (ref 0.60–0.93)
GFR, EST AFRICAN AMERICAN: 51 mL/min — AB (ref >=60)
GFR, Est Non African American: 44 mL/min — ABNORMAL LOW (ref >=60)
Glucose, Bld: 117 mg/dL — ABNORMAL HIGH (ref 70–99)
Potassium: 3.7 mmol/L (ref 3.5–5.3)
Sodium: 139 mmol/L (ref 135–146)
TOTAL PROTEIN: 6.4 g/dL (ref 6.1–8.1)

## 2017-01-08 LAB — LIPID PANEL
CHOLESTEROL: 173 mg/dL (ref <200)
HDL: 67 mg/dL (ref >50)
LDL Cholesterol: 71 mg/dL (ref <100)
TRIGLYCERIDES: 174 mg/dL — AB (ref <150)
Total CHOL/HDL Ratio: 2.6 Ratio (ref <5.0)
VLDL: 35 mg/dL — ABNORMAL HIGH (ref <30)

## 2017-01-08 LAB — HEMOGLOBIN A1C
HEMOGLOBIN A1C: 7 % — AB (ref <5.7)
MEAN PLASMA GLUCOSE: 154 mg/dL

## 2017-01-09 ENCOUNTER — Other Ambulatory Visit: Payer: Self-pay | Admitting: Family Medicine

## 2017-01-09 DIAGNOSIS — I10 Essential (primary) hypertension: Secondary | ICD-10-CM

## 2017-01-09 LAB — MICROALBUMIN, URINE: Microalb, Ur: 1 mg/dL

## 2017-01-09 MED ORDER — HYDROCHLOROTHIAZIDE 25 MG PO TABS: 25.0000 mg | ORAL_TABLET | Freq: Every day | ORAL | 3 refills | Status: DC

## 2017-01-09 MED ORDER — VALSARTAN 320 MG PO TABS: ORAL_TABLET | ORAL | 3 refills | Status: DC

## 2017-01-09 MED ORDER — ATORVASTATIN CALCIUM 40 MG PO TABS: 40.0000 mg | ORAL_TABLET | Freq: Every day | ORAL | 3 refills | Status: DC

## 2017-01-09 MED ORDER — INSULIN GLARGINE 100 UNIT/ML SOLOSTAR PEN: PEN_INJECTOR | SUBCUTANEOUS | 3 refills | Status: DC

## 2017-01-09 MED ORDER — METFORMIN HCL 1000 MG PO TABS: 1000.0000 mg | ORAL_TABLET | Freq: Two times a day (BID) | ORAL | 1 refills | Status: DC

## 2017-01-09 MED ORDER — PIOGLITAZONE HCL 30 MG PO TABS: 30.0000 mg | ORAL_TABLET | Freq: Every day | ORAL | 3 refills | Status: DC

## 2017-01-09 MED ORDER — AMLODIPINE BESYLATE 5 MG PO TABS: 5.0000 mg | ORAL_TABLET | Freq: Every day | ORAL | 3 refills | Status: DC

## 2017-03-07 DIAGNOSIS — Z7982 Long term (current) use of aspirin: Secondary | ICD-10-CM | POA: Diagnosis not present

## 2017-03-07 DIAGNOSIS — I10 Essential (primary) hypertension: Secondary | ICD-10-CM | POA: Diagnosis not present

## 2017-03-07 DIAGNOSIS — E785 Hyperlipidemia, unspecified: Secondary | ICD-10-CM | POA: Diagnosis not present

## 2017-03-07 DIAGNOSIS — D519 Vitamin B12 deficiency anemia, unspecified: Secondary | ICD-10-CM | POA: Diagnosis not present

## 2017-03-07 DIAGNOSIS — M545 Low back pain: Secondary | ICD-10-CM | POA: Diagnosis not present

## 2017-03-07 DIAGNOSIS — K029 Dental caries, unspecified: Secondary | ICD-10-CM | POA: Diagnosis not present

## 2017-03-07 DIAGNOSIS — E669 Obesity, unspecified: Secondary | ICD-10-CM | POA: Diagnosis not present

## 2017-03-07 DIAGNOSIS — Z0000 Encounter for general adult medical examination without abnormal findings: Secondary | ICD-10-CM | POA: Diagnosis not present

## 2017-03-07 DIAGNOSIS — E119 Type 2 diabetes mellitus without complications: Secondary | ICD-10-CM | POA: Diagnosis not present

## 2017-03-07 DIAGNOSIS — R42 Dizziness and giddiness: Secondary | ICD-10-CM | POA: Diagnosis not present

## 2017-03-07 DIAGNOSIS — M13159 Monoarthritis, not elsewhere classified, unspecified hip: Secondary | ICD-10-CM | POA: Diagnosis not present

## 2017-04-11 ENCOUNTER — Telehealth: Payer: Self-pay | Admitting: *Deleted

## 2017-04-11 MED ORDER — MECLIZINE HCL 12.5 MG PO TABS: ORAL_TABLET | ORAL | 0 refills | Status: DC

## 2017-04-11 NOTE — Telephone Encounter (Signed)
Medication called/sent to requested pharmacy  

## 2017-04-11 NOTE — Telephone Encounter (Signed)
Pt would like refill of Meclizine 12.5 mg for vertigo to [facility] on [address].

## 2017-05-18 DIAGNOSIS — M79661 Pain in right lower leg: Secondary | ICD-10-CM | POA: Diagnosis not present

## 2017-08-07 ENCOUNTER — Ambulatory Visit
Admission: RE | Admit: 2017-08-07 | Discharge: 2017-08-07 | Disposition: A | Discharge status: Home / Self Care | Payer: Self-pay | Source: Ambulatory Visit | Attending: Family Medicine | Admitting: Family Medicine

## 2017-08-07 ENCOUNTER — Ambulatory Visit (INDEPENDENT_AMBULATORY_CARE_PROVIDER_SITE_OTHER): Payer: Medicare HMO | Admitting: Family Medicine

## 2017-08-07 VITALS — BP 100/58 | HR 92 | Temp 97.4°F | Resp 20 | Ht 65.5 in | Wt 230.0 lb

## 2017-08-07 DIAGNOSIS — S134XXA Sprain of ligaments of cervical spine, initial encounter: Secondary | ICD-10-CM | POA: Diagnosis not present

## 2017-08-07 DIAGNOSIS — M542 Cervicalgia: Secondary | ICD-10-CM | POA: Diagnosis not present

## 2017-08-07 MED ORDER — MECLIZINE HCL 12.5 MG PO TABS: ORAL_TABLET | ORAL | 0 refills | Status: DC

## 2017-08-07 MED ORDER — DICLOFENAC SODIUM 75 MG PO TBEC: 75.0000 mg | DELAYED_RELEASE_TABLET | Freq: Two times a day (BID) | ORAL | 0 refills | Status: DC
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
## 2017-08-15 ENCOUNTER — Ambulatory Visit (INDEPENDENT_AMBULATORY_CARE_PROVIDER_SITE_OTHER): Payer: Medicare HMO | Admitting: Family Medicine

## 2017-08-15 ENCOUNTER — Telehealth: Payer: Self-pay | Admitting: Family Medicine

## 2017-08-15 ENCOUNTER — Encounter: Payer: Self-pay | Admitting: Family Medicine

## 2017-08-15 VITALS — BP 110/68 | HR 78 | Temp 97.7°F | Resp 18 | Ht 65.5 in | Wt 230.0 lb

## 2017-08-15 DIAGNOSIS — I10 Essential (primary) hypertension: Secondary | ICD-10-CM

## 2017-08-15 DIAGNOSIS — E7800 Pure hypercholesterolemia, unspecified: Secondary | ICD-10-CM | POA: Diagnosis not present

## 2017-08-15 DIAGNOSIS — Z794 Long term (current) use of insulin: Secondary | ICD-10-CM | POA: Diagnosis not present

## 2017-08-15 DIAGNOSIS — Z23 Encounter for immunization: Secondary | ICD-10-CM | POA: Diagnosis not present

## 2017-08-15 DIAGNOSIS — E119 Type 2 diabetes mellitus without complications: Secondary | ICD-10-CM

## 2017-08-15 NOTE — Progress Notes (Signed)
Subjective:    Patient ID: Amanda Riddle, female    DOB: June 27, 1935, 82 y.o.   MRN: [id number]  Medication Refill    Patient is 82 y/o AAF here for follow up.  Has DMII on Lantus 45 units daily and actos 30 mg poqday as well as metformin.Marland Kitchen He admits that she is not checking her blood sugars.  However she denies any polyuria, polydipsia, or blurry vision.  Her diabetic eye exam is up-to-date although is coming due later this month.  However she is already scheduled this.  She is due for the flu shot which she received today.  She is also due for urine microalbumin testing.  Her blood pressure today is well controlled. She is not having any chest pain or shortness of breath or dyspnea on exertion. Diabetic foot exam is performed today and is outstanding with no evidence of neuropathy. She denies any myalgias or right upper quadrant pain on cholesterol medication Past Medical History:  Diagnosis Date  . Diabetes mellitus   . Hypercholesteremia   . Hypertension    Past Surgical History:  Procedure Laterality Date  . ABDOMINAL HYSTERECTOMY  1986  . TONSILLECTOMY  1950   Current Outpatient Prescriptions on File Prior to Visit  Medication Sig Dispense Refill  . albuterol (PROVENTIL HFA;VENTOLIN HFA) 108 (90 BASE) MCG/ACT inhaler Inhale 2 puffs into the lungs every 6 (six) hours as needed. Shortness of breath 3 Inhaler 2  . amLODipine (NORVASC) 5 MG tablet Take 1 tablet (5 mg total) by mouth daily. 90 tablet 3  . aspirin EC 81 MG tablet Take 81 mg by mouth at bedtime.    Marland Kitchen atorvastatin (LIPITOR) 40 MG tablet Take 1 tablet (40 mg total) by mouth daily. 90 tablet 3  . Blood Glucose Monitoring Suppl (BLOOD GLUCOSE MONITOR SYSTEM) w/Device KIT Dispense one touch per pts insurance. Checks BS 4x daily d/t fluctuating blood glucose. Dx: E11.65 1 each 1  . Cyanocobalamin (VITAMIN B 12 PO) Take 1 tablet by mouth at bedtime.     . diclofenac (VOLTAREN) 75 MG EC tablet Take 1 tablet (75 mg total) by mouth  2 (two) times daily. 30 tablet 0  . Flaxseed, Linseed, (FLAXSEED OIL PO) Take 1 capsule by mouth 2 (two) times daily.     . Glucosamine Sulfate-MSM (MSM-GLUCOSAMINE PO) Take 1 capsule by mouth 2 (two) times daily.    . Glucose Blood (BLOOD GLUCOSE TEST STRIPS) STRP Dispense one touch strips. Checks BS 4x daily d/t fluctuating blood glucose. Dx: E11.65 150 each 11  . hydrochlorothiazide (HYDRODIURIL) 25 MG tablet Take 1 tablet (25 mg total) by mouth daily. 90 tablet 3  . Insulin Glargine (LANTUS SOLOSTAR) 100 UNIT/ML Solostar Pen INJECT 45 UNITS INTO THE SKIN AT BEDTIME 48 mL 3  . Insulin Pen Needle (B-D UF III MINI PEN NEEDLES) 31G X 5 MM MISC 1 each by Does not apply route daily. CHECK BS QID (FOUR TIMES A DAY) Dx 250.00 30 each 11  . LANCETS ULTRA FINE MISC Dispense based on patient and insurance preference. Use to monitor FSBS 4x daily d/t fluctuating blood glucose. Dx: E11.65 150 each 0  . meclizine (ANTIVERT) 12.5 MG tablet TAKE 1 TABLET BY MOUTH 3 TIMES DAILY AS NEEDED FOR DIZZINESS. 40 tablet 0  . metFORMIN (GLUCOPHAGE) 1000 MG tablet Take 1 tablet (1,000 mg total) by mouth 2 (two) times daily with a meal. 180 tablet 1  . pioglitazone (ACTOS) 30 MG tablet Take 1 tablet (30 mg total)  by mouth daily. 90 tablet 3  . valsartan (DIOVAN) 320 MG tablet TAKE 1 TABLET (320 MG TOTAL) BY MOUTH DAILY. 90 tablet 3   No current facility-administered medications on file prior to visit.    Allergies  Allergen Reactions  . Codeine Itching   Social History   Social History  . Marital status: Divorced    Spouse name: N/A  . Number of children: N/A  . Years of education: N/A   Occupational History  . Not on file.   Social History Main Topics  . Smoking status: Never Smoker  . Smokeless tobacco: Current User    Types: Snuff  . Alcohol use No  . Drug use: No  . Sexual activity: Not on file   Other Topics Concern  . Not on file   Social History Narrative  . No narrative on file       Review of Systems  All other systems reviewed and are negative.      Objective:   Physical Exam  Constitutional: She is oriented to person, place, and time. She appears well-developed and well-nourished.  Neck: Neck supple. No JVD present. No thyromegaly present.  Cardiovascular: Normal rate, regular rhythm, normal heart sounds and intact distal pulses.   No murmur heard. Pulmonary/Chest: Effort normal and breath sounds normal. No respiratory distress. She has no wheezes. She has no rales.  Abdominal: Soft. Bowel sounds are normal. She exhibits no distension and no mass. There is no tenderness. There is no rebound and no guarding.  Musculoskeletal: She exhibits no edema.  Lymphadenopathy:    She has no cervical adenopathy.  Neurological: She is alert and oriented to person, place, and time.  Skin: No rash noted.  Vitals reviewed.         Assessment & Plan:  Controlled type 2 diabetes mellitus without complication, with long-term current use of insulin ([place]) - Plan: CBC with Differential/Platelet, COMPLETE METABOLIC PANEL WITH GFR, Lipid panel, Microalbumin, urine, Hemoglobin A1c  Pure hypercholesterolemia  Essential hypertension  The patient's exam is completely normal. Her blood pressure today is excellent. Eye exam and foot exam are up-to-date.  I will check an A1c, fasting lipid panel, and urine microalbumin. I Given her advanced age, I am comfortable with a hemoglobin A1c between 7 and 8. I will also check a fasting lipid panel. Her goal LDL cholesterol is less than 100.  Her flu shot today.  Continue to encourage a low carbohydrate diet, and exercise as she will tolerate.  Diabetic foot exam is normal.

## 2017-08-15 NOTE — Telephone Encounter (Signed)
Amanda Riddle needs to be sent to [facility] [address] not [facility]

## 2017-08-15 NOTE — Addendum Note (Signed)
Addended by: Shary Decamp B on: 08/15/2017 11:14 AM   Modules accepted: Orders

## 2017-08-16 LAB — COMPLETE METABOLIC PANEL WITH GFR
AG Ratio: 1.4 (calc) (ref 1.0–2.5)
ALBUMIN MSPROF: 3.9 g/dL (ref 3.6–5.1)
ALKALINE PHOSPHATASE (APISO): 72 U/L (ref 33–130)
ALT: 11 U/L (ref 6–29)
AST: 16 U/L (ref 10–35)
BILIRUBIN TOTAL: 0.6 mg/dL (ref 0.2–1.2)
BUN/Creatinine Ratio: 13 (calc) (ref 6–22)
BUN: 19 mg/dL (ref 7–25)
CHLORIDE: 107 mmol/L (ref 98–110)
CO2: 23 mmol/L (ref 20–32)
CREATININE: 1.42 mg/dL — AB (ref 0.60–0.88)
Calcium: 9.4 mg/dL (ref 8.6–10.4)
GFR, Est African American: 40 mL/min/{1.73_m2} — ABNORMAL LOW (ref >=60)
GFR, Est Non African American: 35 mL/min/{1.73_m2} — ABNORMAL LOW (ref >=60)
Globulin: 2.7 g/dL (calc) (ref 1.9–3.7)
Glucose, Bld: 89 mg/dL (ref 65–99)
Potassium: 4.4 mmol/L (ref 3.5–5.3)
Sodium: 142 mmol/L (ref 135–146)
Total Protein: 6.6 g/dL (ref 6.1–8.1)

## 2017-08-16 LAB — LIPID PANEL
CHOLESTEROL: 272 mg/dL — AB (ref <200)
HDL: 70 mg/dL (ref >50)
LDL CHOLESTEROL (CALC): 166 mg/dL — AB
Non-HDL Cholesterol (Calc): 202 mg/dL (calc) — ABNORMAL HIGH (ref <130)
TRIGLYCERIDES: 198 mg/dL — AB (ref <150)
Total CHOL/HDL Ratio: 3.9 (calc) (ref <5.0)

## 2017-08-16 LAB — HEMOGLOBIN A1C
HEMOGLOBIN A1C: 6.2 %{Hb} — AB (ref <5.7)
MEAN PLASMA GLUCOSE: 131 (calc)
eAG (mmol/L): 7.3 (calc)

## 2017-08-16 LAB — CBC WITH DIFFERENTIAL/PLATELET
BASOS ABS: 40 {cells}/uL (ref 0–200)
Basophils Relative: 0.6 %
EOS ABS: 119 {cells}/uL (ref 15–500)
Eosinophils Relative: 1.8 %
HEMATOCRIT: 33.8 % — AB (ref 35.0–45.0)
HEMOGLOBIN: 11.2 g/dL — AB (ref 11.7–15.5)
Lymphs Abs: 3353 cells/uL (ref 850–3900)
MCH: 29.2 pg (ref 27.0–33.0)
MCHC: 33.1 g/dL (ref 32.0–36.0)
MCV: 88 fL (ref 80.0–100.0)
MPV: 9.3 fL (ref 7.5–12.5)
Monocytes Relative: 6.4 %
NEUTROS ABS: 2666 {cells}/uL (ref 1500–7800)
Neutrophils Relative %: 40.4 %
Platelets: 285 10*3/uL (ref 140–400)
RBC: 3.84 10*6/uL (ref 3.80–5.10)
RDW: 13.6 % (ref 11.0–15.0)
Total Lymphocyte: 50.8 %
WBC: 6.6 10*3/uL (ref 3.8–10.8)
WBCMIX: 422 {cells}/uL (ref 200–950)

## 2017-08-16 LAB — MICROALBUMIN, URINE: Microalb, Ur: 0.3 mg/dL

## 2017-08-18 NOTE — Telephone Encounter (Signed)
Januvia is not on pt's list - it was too expensive for her and it was switched to Actos. Called pt [facility] for which medication she really needs to be refilled.

## 2017-08-19 DIAGNOSIS — H5200 Hypermetropia, unspecified eye: Secondary | ICD-10-CM | POA: Diagnosis not present

## 2017-08-19 DIAGNOSIS — H25811 Combined forms of age-related cataract, right eye: Secondary | ICD-10-CM | POA: Diagnosis not present

## 2017-08-21 NOTE — Telephone Encounter (Signed)
LMTRC

## 2017-08-27 NOTE — Telephone Encounter (Signed)
LMTRC

## 2017-09-02 ENCOUNTER — Other Ambulatory Visit: Payer: Self-pay | Admitting: Family Medicine

## 2017-09-02 DIAGNOSIS — S134XXA Sprain of ligaments of cervical spine, initial encounter: Secondary | ICD-10-CM

## 2017-09-02 NOTE — Telephone Encounter (Signed)
Letter sent to pt to call office and give labs results as I have called several times with no success.

## 2017-09-17 ENCOUNTER — Telehealth: Payer: Self-pay | Admitting: Family Medicine

## 2017-09-17 NOTE — Telephone Encounter (Signed)
Patient says her insurance no longer covers lantus, would like to know what other insulin she could take  [phone]

## 2017-09-18 MED ORDER — ATORVASTATIN CALCIUM 40 MG PO TABS: 40.0000 mg | ORAL_TABLET | Freq: Every day | ORAL | 3 refills | Status: DC

## 2017-09-18 NOTE — Telephone Encounter (Signed)
Called and spoke to pharm her Lantus is cover at 126 for 3 boxes. Called and spoke to pt and she states someone from [place] called stating it was no longer covered. I think they meant it would not be covered next year. Informed pt and she will go get at pharmacy. Also pt had not been taking her Lipitor after looking through her medications while on phone - she thought she was but apparently she was not. Told her to go get it also and start taking and will recheck cholesterol in 3 months. She verbalized understanding. Med sent to pharm.

## 2017-09-19 ENCOUNTER — Other Ambulatory Visit: Payer: Self-pay

## 2017-09-19 MED ORDER — INSULIN GLARGINE 100 UNIT/ML SOLOSTAR PEN: PEN_INJECTOR | SUBCUTANEOUS | 3 refills | Status: DC

## 2017-09-21 NOTE — Telephone Encounter (Signed)
agree

## 2017-11-26 ENCOUNTER — Other Ambulatory Visit: Payer: Self-pay | Admitting: Family Medicine

## 2017-11-26 DIAGNOSIS — Z1231 Encounter for screening mammogram for malignant neoplasm of breast: Secondary | ICD-10-CM

## 2017-12-24 ENCOUNTER — Telehealth: Payer: Self-pay | Admitting: Family Medicine

## 2017-12-24 MED ORDER — INSULIN GLARGINE 100 UNIT/ML SOLOSTAR PEN: PEN_INJECTOR | SUBCUTANEOUS | 3 refills | Status: DC

## 2017-12-24 NOTE — Telephone Encounter (Signed)
Received an approval for Lantus and not sure who initiated this but note sent to pharm that it has been approved.

## 2018-01-05 ENCOUNTER — Ambulatory Visit
Admission: RE | Admit: 2018-01-05 | Discharge: 2018-01-05 | Disposition: A | Discharge status: Home / Self Care | Payer: Medicare HMO | Source: Ambulatory Visit | Attending: Family Medicine | Admitting: Family Medicine

## 2018-01-05 DIAGNOSIS — Z1231 Encounter for screening mammogram for malignant neoplasm of breast: Secondary | ICD-10-CM

## 2018-01-20 ENCOUNTER — Other Ambulatory Visit: Payer: Self-pay | Admitting: Family Medicine

## 2018-01-20 DIAGNOSIS — I10 Essential (primary) hypertension: Secondary | ICD-10-CM

## 2018-01-29 DIAGNOSIS — M2550 Pain in unspecified joint: Secondary | ICD-10-CM | POA: Diagnosis not present

## 2018-01-29 DIAGNOSIS — R609 Edema, unspecified: Secondary | ICD-10-CM | POA: Diagnosis not present

## 2018-01-29 DIAGNOSIS — I10 Essential (primary) hypertension: Secondary | ICD-10-CM | POA: Diagnosis not present

## 2018-01-29 DIAGNOSIS — R69 Illness, unspecified: Secondary | ICD-10-CM | POA: Diagnosis not present

## 2018-01-29 DIAGNOSIS — Z6838 Body mass index (BMI) 38.0-38.9, adult: Secondary | ICD-10-CM | POA: Diagnosis not present

## 2018-01-29 DIAGNOSIS — E11319 Type 2 diabetes mellitus with unspecified diabetic retinopathy without macular edema: Secondary | ICD-10-CM | POA: Diagnosis not present

## 2018-01-29 DIAGNOSIS — Z794 Long term (current) use of insulin: Secondary | ICD-10-CM | POA: Diagnosis not present

## 2018-01-29 DIAGNOSIS — E1136 Type 2 diabetes mellitus with diabetic cataract: Secondary | ICD-10-CM | POA: Diagnosis not present

## 2018-01-29 DIAGNOSIS — E785 Hyperlipidemia, unspecified: Secondary | ICD-10-CM | POA: Diagnosis not present

## 2018-02-04 ENCOUNTER — Other Ambulatory Visit: Payer: Self-pay | Admitting: Family Medicine

## 2018-02-13 ENCOUNTER — Ambulatory Visit (INDEPENDENT_AMBULATORY_CARE_PROVIDER_SITE_OTHER): Payer: Medicare HMO | Admitting: Family Medicine

## 2018-02-13 ENCOUNTER — Encounter: Payer: Self-pay | Admitting: Family Medicine

## 2018-02-13 VITALS — BP 110/62 | HR 74 | Temp 97.5°F | Resp 12 | Ht 65.5 in | Wt 236.0 lb

## 2018-02-13 DIAGNOSIS — E119 Type 2 diabetes mellitus without complications: Secondary | ICD-10-CM

## 2018-02-13 DIAGNOSIS — Z794 Long term (current) use of insulin: Secondary | ICD-10-CM

## 2018-02-13 DIAGNOSIS — E7800 Pure hypercholesterolemia, unspecified: Secondary | ICD-10-CM

## 2018-02-13 DIAGNOSIS — I10 Essential (primary) hypertension: Secondary | ICD-10-CM | POA: Diagnosis not present

## 2018-02-13 LAB — COMPLETE METABOLIC PANEL WITH GFR
AG RATIO: 1.6 (calc) (ref 1.0–2.5)
ALKALINE PHOSPHATASE (APISO): 84 U/L (ref 33–130)
ALT: 10 U/L (ref 6–29)
AST: 16 U/L (ref 10–35)
Albumin: 4.1 g/dL (ref 3.6–5.1)
BILIRUBIN TOTAL: 0.7 mg/dL (ref 0.2–1.2)
BUN/Creatinine Ratio: 16 (calc) (ref 6–22)
BUN: 21 mg/dL (ref 7–25)
CHLORIDE: 105 mmol/L (ref 98–110)
CO2: 25 mmol/L (ref 20–32)
CREATININE: 1.32 mg/dL — AB (ref 0.60–0.88)
Calcium: 9.6 mg/dL (ref 8.6–10.4)
GFR, Est African American: 44 mL/min/{1.73_m2} — ABNORMAL LOW (ref >=60)
GFR, Est Non African American: 38 mL/min/{1.73_m2} — ABNORMAL LOW (ref >=60)
Globulin: 2.6 g/dL (calc) (ref 1.9–3.7)
Glucose, Bld: 87 mg/dL (ref 65–99)
POTASSIUM: 4.4 mmol/L (ref 3.5–5.3)
SODIUM: 141 mmol/L (ref 135–146)
Total Protein: 6.7 g/dL (ref 6.1–8.1)

## 2018-02-13 LAB — HEMOGLOBIN A1C
EAG (MMOL/L): 7.6 (calc)
Hgb A1c MFr Bld: 6.4 % of total Hgb — ABNORMAL HIGH (ref <5.7)
MEAN PLASMA GLUCOSE: 137 (calc)

## 2018-02-13 LAB — CBC WITH DIFFERENTIAL/PLATELET
BASOS ABS: 41 {cells}/uL (ref 0–200)
Basophils Relative: 0.7 %
EOS PCT: 1.5 %
Eosinophils Absolute: 89 cells/uL (ref 15–500)
HEMATOCRIT: 34.4 % — AB (ref 35.0–45.0)
Hemoglobin: 11.4 g/dL — ABNORMAL LOW (ref 11.7–15.5)
LYMPHS ABS: 2679 {cells}/uL (ref 850–3900)
MCH: 29.4 pg (ref 27.0–33.0)
MCHC: 33.1 g/dL (ref 32.0–36.0)
MCV: 88.7 fL (ref 80.0–100.0)
MONOS PCT: 7.3 %
MPV: 9.4 fL (ref 7.5–12.5)
NEUTROS PCT: 45.1 %
Neutro Abs: 2661 cells/uL (ref 1500–7800)
Platelets: 266 10*3/uL (ref 140–400)
RBC: 3.88 10*6/uL (ref 3.80–5.10)
RDW: 13.4 % (ref 11.0–15.0)
Total Lymphocyte: 45.4 %
WBC mixed population: 431 cells/uL (ref 200–950)
WBC: 5.9 10*3/uL (ref 3.8–10.8)

## 2018-02-13 LAB — LIPID PANEL
CHOLESTEROL: 170 mg/dL (ref <200)
HDL: 65 mg/dL (ref >50)
LDL Cholesterol (Calc): 85 mg/dL (calc)
Non-HDL Cholesterol (Calc): 105 mg/dL (calc) (ref <130)
Total CHOL/HDL Ratio: 2.6 (calc) (ref <5.0)
Triglycerides: 107 mg/dL (ref <150)

## 2018-02-13 MED ORDER — INSULIN NPH ISOPHANE & REGULAR (70-30) 100 UNIT/ML ~~LOC~~ SUSP: SUBCUTANEOUS | 11 refills | Status: DC

## 2018-02-13 MED ORDER — LISINOPRIL 40 MG PO TABS: 40.0000 mg | ORAL_TABLET | Freq: Every day | ORAL | 3 refills | Status: DC

## 2018-02-13 NOTE — Progress Notes (Signed)
Subjective:    Patient ID: Amanda Riddle, female    DOB: [birth year], 83 y.o.   MRN: [id number]  HPI Patient is here today for follow-up of her diabetes.  She is currently on Lantus 45 units once a day.  The lowest sugar she is seen in the last few months has been 110.  The highest sugar she has seen has been less than 160.  Therefore her sugars seem to be well controlled.  She denies any episodes of hypoglycemia.  However her insulin is going to cost her more than $100 a month which she is unable to afford.  She also needs to change her valsartan prescription as her medication has been recalled.  She denies any chest pain shortness of breath or dyspnea on exertion.  She denies any myalgias or right upper quadrant pain.  She denies any abdominal discomfort constipation, melena, nausea or vomiting.  She denies any numbness in her feet.  Last diabetic eye exam was in September although we do not have records of this.  It was reportedly normal aside from some cataracts.  She is scheduled to see them again this coming September Past Medical History:  Diagnosis Date  . Diabetes mellitus   . Hypercholesteremia   . Hypertension    Past Surgical History:  Procedure Laterality Date  . ABDOMINAL HYSTERECTOMY  1986  . TONSILLECTOMY  1950   Current Outpatient Medications on File Prior to Visit  Medication Sig Dispense Refill  . albuterol (PROVENTIL HFA;VENTOLIN HFA) 108 (90 BASE) MCG/ACT inhaler Inhale 2 puffs into the lungs every 6 (six) hours as needed. Shortness of breath 3 Inhaler 2  . amLODipine (NORVASC) 5 MG tablet TAKE 1 TABLET BY MOUTH EVERY DAY 90 tablet 3  . aspirin EC 81 MG tablet Take 81 mg by mouth at bedtime.    Marland Kitchen atorvastatin (LIPITOR) 40 MG tablet Take 1 tablet (40 mg total) by mouth daily. 90 tablet 3  . Blood Glucose Monitoring Suppl (BLOOD GLUCOSE MONITOR SYSTEM) w/Device KIT Dispense one touch per pts insurance. Checks BS 4x daily d/t fluctuating blood glucose. Dx: E11.65 1 each 1    . Cyanocobalamin (VITAMIN B 12 PO) Take 1 tablet by mouth at bedtime.     . diclofenac (VOLTAREN) 75 MG EC tablet TAKE 1 TABLET BY MOUTH TWICE A DAY 30 tablet 0  . Flaxseed, Linseed, (FLAXSEED OIL PO) Take 1 capsule by mouth 2 (two) times daily.     . Glucosamine Sulfate-MSM (MSM-GLUCOSAMINE PO) Take 1 capsule by mouth 2 (two) times daily.    . Glucose Blood (BLOOD GLUCOSE TEST STRIPS) STRP Dispense one touch strips. Checks BS 4x daily d/t fluctuating blood glucose. Dx: E11.65 150 each 11  . hydrochlorothiazide (HYDRODIURIL) 25 MG tablet TAKE 1 TABLET BY MOUTH EVERY DAY 90 tablet 3  . Insulin Glargine (LANTUS SOLOSTAR) 100 UNIT/ML Solostar Pen INJECT 45 UNITS INTO THE SKIN AT BEDTIME 48 mL 3  . Insulin Pen Needle (B-D UF III MINI PEN NEEDLES) 31G X 5 MM MISC 1 each by Does not apply route daily. CHECK BS QID (FOUR TIMES A DAY) Dx 250.00 30 each 11  . LANCETS ULTRA FINE MISC Dispense based on patient and insurance preference. Use to monitor FSBS 4x daily d/t fluctuating blood glucose. Dx: E11.65 150 each 0  . meclizine (ANTIVERT) 12.5 MG tablet TAKE 1 TABLET BY MOUTH 3 TIMES DAILY AS NEEDED FOR DIZZINESS. 40 tablet 0  . metFORMIN (GLUCOPHAGE) 1000 MG tablet TAKE 1 TABLET  BY MOUTH TWICE A DAY WITH A MEAL 180 tablet 1  . pioglitazone (ACTOS) 30 MG tablet TAKE 1 TABLET BY MOUTH EVERY DAY 90 tablet 3   No current facility-administered medications on file prior to visit.    Allergies  Allergen Reactions  . Codeine Itching   Social History   Socioeconomic History  . Marital status: Divorced    Spouse name: Not on file  . Number of children: Not on file  . Years of education: Not on file  . Highest education level: Not on file  Occupational History  . Not on file  Social Needs  . Financial resource strain: Not on file  . Food insecurity:    Worry: Not on file    Inability: Not on file  . Transportation needs:    Medical: Not on file    Non-medical: Not on file  Tobacco Use  . Smoking  status: Never Smoker  . Smokeless tobacco: Current User    Types: Snuff  Substance and Sexual Activity  . Alcohol use: No  . Drug use: No  . Sexual activity: Not on file  Lifestyle  . Physical activity:    Days per week: Not on file    Minutes per session: Not on file  . Stress: Not on file  Relationships  . Social connections:    Talks on phone: Not on file    Gets together: Not on file    Attends religious service: Not on file    Active member of club or organization: Not on file    Attends meetings of clubs or organizations: Not on file    Relationship status: Not on file  . Intimate partner violence:    Fear of current or ex partner: Not on file    Emotionally abused: Not on file    Physically abused: Not on file    Forced sexual activity: Not on file  Other Topics Concern  . Not on file  Social History Narrative  . Not on file      Review of Systems  All other systems reviewed and are negative.      Objective:   Physical Exam  Constitutional: She appears well-developed and well-nourished. No distress.  Neck: Neck supple. No JVD present.  Cardiovascular: Normal rate, regular rhythm, normal heart sounds and intact distal pulses.  No murmur heard. Pulmonary/Chest: Effort normal and breath sounds normal. No respiratory distress. She has no wheezes. She has no rales.  Abdominal: Soft. Bowel sounds are normal. She exhibits no distension. There is no tenderness. There is no rebound and no guarding.  Musculoskeletal: She exhibits no edema.  Lymphadenopathy:    She has no cervical adenopathy.  Skin: She is not diaphoretic.  Vitals reviewed.         Assessment & Plan:  Controlled type 2 diabetes mellitus without complication, with long-term current use of insulin ([facility]) - Plan: CBC with Differential/Platelet, COMPLETE METABOLIC PANEL WITH GFR, Lipid panel, Microalbumin, urine, Hemoglobin A1c  Pure hypercholesterolemia  Essential hypertension  The patient's  blood pressure is well controlled at 110/62.  She denies any angina or chest pain.  She is compliant with aspirin 81 mg a day.  I recommended switching valsartan which has been recalled to lisinopril 40 mg a day.  Her reported blood sugars are well controlled.  However we will discontinue Lantus and switch to NPH 70/30 30 units in the morning with breakfast and 15 units in the afternoon with dinner.  Check hemoglobin  A1c along with urine microalbumin.  Also check fasting lipid panel.  Goal LDL cholesterol is less than 100.

## 2018-02-14 LAB — MICROALBUMIN, URINE: Microalb, Ur: 0.6 mg/dL

## 2018-02-27 ENCOUNTER — Other Ambulatory Visit: Payer: Self-pay | Admitting: Family Medicine

## 2018-02-27 DIAGNOSIS — IMO0002 Reserved for concepts with insufficient information to code with codable children: Secondary | ICD-10-CM

## 2018-02-27 DIAGNOSIS — Z794 Long term (current) use of insulin: Principal | ICD-10-CM

## 2018-02-27 DIAGNOSIS — E1165 Type 2 diabetes mellitus with hyperglycemia: Secondary | ICD-10-CM

## 2018-02-27 DIAGNOSIS — E118 Type 2 diabetes mellitus with unspecified complications: Principal | ICD-10-CM

## 2018-02-27 MED ORDER — INSULIN PEN NEEDLE 31G X 5 MM MISC: 1.0000 | Freq: Every day | 11 refills | Status: DC

## 2018-03-02 ENCOUNTER — Other Ambulatory Visit: Payer: Self-pay | Admitting: Family Medicine

## 2018-03-02 MED ORDER — INSULIN SYRINGES (DISPOSABLE) U-100 1 ML MISC: 11 refills | Status: DC

## 2018-03-02 MED ORDER — INSULIN NPH ISOPHANE & REGULAR (70-30) 100 UNIT/ML ~~LOC~~ SUSP: SUBCUTANEOUS | 11 refills | Status: DC

## 2018-03-07 ENCOUNTER — Encounter: Payer: Self-pay | Admitting: Gastroenterology

## 2018-03-19 ENCOUNTER — Encounter: Payer: Self-pay | Admitting: Gastroenterology

## 2018-04-24 ENCOUNTER — Encounter: Payer: Self-pay | Admitting: Family Medicine

## 2018-04-24 ENCOUNTER — Ambulatory Visit (INDEPENDENT_AMBULATORY_CARE_PROVIDER_SITE_OTHER): Payer: Medicare HMO | Admitting: Family Medicine

## 2018-04-24 VITALS — BP 122/58 | HR 72 | Temp 97.8°F | Resp 18 | Ht 65.5 in | Wt 228.0 lb

## 2018-04-24 DIAGNOSIS — T783XXA Angioneurotic edema, initial encounter: Secondary | ICD-10-CM

## 2018-04-24 NOTE — Progress Notes (Signed)
Subjective:    Patient ID: Amanda Riddle, female    DOB: 10-28-1934, 83 y.o.   MRN: [id number]  HPI Yesterday, the patient had an episode where she felt swelling on her lower left side of her face, her lower left lip, her upper left lip, the swelling seemed to spread all across her lips.  It subsided by this morning.  She denied any tongue swelling or trouble breathing or trouble swallowing.  She denies any pain in her teeth are in her jaw.  On examination today she does have poor dentition but there is no visible dental abscess.  There is no erythema or swelling or fluctuance in the jaw.  There is no sinus pain to percussion.  She denies any swelling or pain over her parotid glands or her submandibular glands.  Symptoms are concerning for angioedema.  Patient is on lisinopril Past Medical History:  Diagnosis Date  . Diabetes mellitus   . Hypercholesteremia   . Hypertension    Past Surgical History:  Procedure Laterality Date  . ABDOMINAL HYSTERECTOMY  1986  . TONSILLECTOMY  1950   Current Outpatient Medications on File Prior to Visit  Medication Sig Dispense Refill  . albuterol (PROVENTIL HFA;VENTOLIN HFA) 108 (90 BASE) MCG/ACT inhaler Inhale 2 puffs into the lungs every 6 (six) hours as needed. Shortness of breath 3 Inhaler 2  . amLODipine (NORVASC) 5 MG tablet TAKE 1 TABLET BY MOUTH EVERY DAY 90 tablet 3  . aspirin EC 81 MG tablet Take 81 mg by mouth at bedtime.    Marland Kitchen atorvastatin (LIPITOR) 40 MG tablet Take 1 tablet (40 mg total) by mouth daily. 90 tablet 3  . Blood Glucose Monitoring Suppl (BLOOD GLUCOSE MONITOR SYSTEM) w/Device KIT Dispense one touch per pts insurance. Checks BS 4x daily d/t fluctuating blood glucose. Dx: E11.65 1 each 1  . Cyanocobalamin (VITAMIN B 12 PO) Take 1 tablet by mouth at bedtime.     . diclofenac (VOLTAREN) 75 MG EC tablet TAKE 1 TABLET BY MOUTH TWICE A DAY 30 tablet 0  . Flaxseed, Linseed, (FLAXSEED OIL PO) Take 1 capsule by mouth 2 (two) times daily.       . Glucosamine Sulfate-MSM (MSM-GLUCOSAMINE PO) Take 1 capsule by mouth 2 (two) times daily.    . Glucose Blood (BLOOD GLUCOSE TEST STRIPS) STRP Dispense one touch strips. Checks BS 4x daily d/t fluctuating blood glucose. Dx: E11.65 150 each 11  . hydrochlorothiazide (HYDRODIURIL) 25 MG tablet TAKE 1 TABLET BY MOUTH EVERY DAY 90 tablet 3  . insulin NPH-regular Human (NOVOLIN 70/30) (70-30) 100 UNIT/ML injection 30 units SQ in the morning and 15 units sq in the afternoon 20 mL 11  . Insulin Pen Needle (B-D UF III MINI PEN NEEDLES) 31G X 5 MM MISC 1 each by Does not apply route daily. CHECK BS QID (FOUR TIMES A DAY) Dx e11.9 30 each 11  . Insulin Syringes, Disposable, U-100 1 ML MISC Use BID with insulin 60 each 11  . LANCETS ULTRA FINE MISC Dispense based on patient and insurance preference. Use to monitor FSBS 4x daily d/t fluctuating blood glucose. Dx: E11.65 150 each 0  . lisinopril (PRINIVIL,ZESTRIL) 40 MG tablet Take 1 tablet (40 mg total) by mouth daily. Stop valsartan 90 tablet 3  . meclizine (ANTIVERT) 12.5 MG tablet TAKE 1 TABLET BY MOUTH 3 TIMES DAILY AS NEEDED FOR DIZZINESS. 40 tablet 0  . metFORMIN (GLUCOPHAGE) 1000 MG tablet TAKE 1 TABLET BY MOUTH TWICE A DAY WITH  A MEAL 180 tablet 1  . pioglitazone (ACTOS) 30 MG tablet TAKE 1 TABLET BY MOUTH EVERY DAY 90 tablet 3   No current facility-administered medications on file prior to visit.    Allergies  Allergen Reactions  . Codeine Itching   Social History   Socioeconomic History  . Marital status: Divorced    Spouse name: Not on file  . Number of children: Not on file  . Years of education: Not on file  . Highest education level: Not on file  Occupational History  . Not on file  Social Needs  . Financial resource strain: Not on file  . Food insecurity:    Worry: Not on file    Inability: Not on file  . Transportation needs:    Medical: Not on file    Non-medical: Not on file  Tobacco Use  . Smoking status: Never Smoker   . Smokeless tobacco: Current User    Types: Snuff  Substance and Sexual Activity  . Alcohol use: No  . Drug use: No  . Sexual activity: Not on file  Lifestyle  . Physical activity:    Days per week: Not on file    Minutes per session: Not on file  . Stress: Not on file  Relationships  . Social connections:    Talks on phone: Not on file    Gets together: Not on file    Attends religious service: Not on file    Active member of club or organization: Not on file    Attends meetings of clubs or organizations: Not on file    Relationship status: Not on file  . Intimate partner violence:    Fear of current or ex partner: Not on file    Emotionally abused: Not on file    Physically abused: Not on file    Forced sexual activity: Not on file  Other Topics Concern  . Not on file  Social History Narrative  . Not on file      Review of Systems  All other systems reviewed and are negative.      Objective:   Physical Exam  Constitutional: She appears well-developed and well-nourished. No distress.  Neck: Neck supple. No JVD present.  Cardiovascular: Normal rate, regular rhythm, normal heart sounds and intact distal pulses.  No murmur heard. Pulmonary/Chest: Effort normal and breath sounds normal. No respiratory distress. She has no wheezes. She has no rales.  Abdominal: Soft. Bowel sounds are normal. She exhibits no distension. There is no tenderness. There is no rebound and no guarding.  Musculoskeletal: She exhibits no edema.  Lymphadenopathy:    She has no cervical adenopathy.  Skin: She is not diaphoretic.  Vitals reviewed.         Assessment & Plan:  Angioedema, initial encounter  Her exam today is normal.  Her story sounds consistent with angioedema secondary to an ACE inhibitor.  I have recommended discontinuation of lisinopril and clinical monitoring over the next week or so to see if symptoms recur.  May tentatively try an angiotensin receptor blocker in the  future once we have verified if in fact this was the cause.  Recheck blood pressure in 1 week

## 2018-05-26 ENCOUNTER — Ambulatory Visit: Payer: Medicare HMO | Admitting: Gastroenterology

## 2018-05-26 ENCOUNTER — Encounter: Payer: Self-pay | Admitting: Gastroenterology

## 2018-05-26 ENCOUNTER — Encounter (INDEPENDENT_AMBULATORY_CARE_PROVIDER_SITE_OTHER): Payer: Self-pay

## 2018-05-26 VITALS — BP 128/76 | HR 64 | Ht 65.5 in | Wt 224.0 lb

## 2018-05-26 DIAGNOSIS — Z86010 Personal history of colonic polyps: Secondary | ICD-10-CM | POA: Diagnosis not present

## 2018-05-26 DIAGNOSIS — R54 Age-related physical debility: Secondary | ICD-10-CM

## 2018-05-26 NOTE — Patient Instructions (Signed)
If you are age 81 or older, your body mass index should be between 23-30. Your Body mass index is 36.71 kg/m. If this is out of the aforementioned range listed, please consider follow up with your Primary Care Provider.  If you are age 54 or younger, your body mass index should be between 19-25. Your Body mass index is 36.71 kg/m. If this is out of the aformentioned range listed, please consider follow up with your Primary Care Provider.   Thank you for entrusting me with your care and for choosing [facility], Dr. Norfolk Cellar

## 2018-05-26 NOTE — Progress Notes (Signed)
HPI :  83 year old female with history of diabetes, hypertension, hyperlipidemia, here for new patient visit to discuss colon cancer screening / results of colon polyps.  She was previously followed by Dr. Deatra Ina, last colonoscopy was performed in June 2014 at which point she was noted to have diverticulosis and one small adenoma. Her exam before that was in 2008 at which point she had 1 diminutive adenoma as well. She denies any problems with her bowels at all. She denies any abdominal pains. No blood in her stools. She does not have any gastrointestinal symptoms that bother her at all. She denies any family history of colon cancer. She is otherwise in good health, accompanied by her daughter today. She has no complaints otherwise.   Colonoscopy 03/30/2013 - diverticulosis, one polyp transverse colon - adenoma Colonoscopy 02/2007 - one polyp - adenoma, 33m Colonoscopy 02/2002 - benign colon polyps   Past Medical History:  Diagnosis Date  . Diabetes mellitus   . Diverticulosis   . Hypercholesteremia   . Hypertension      Past Surgical History:  Procedure Laterality Date  . ABDOMINAL HYSTERECTOMY  1986  . TONSILLECTOMY  1950   Family History  Problem Relation Age of Onset  . Colon cancer Neg Hx   . Breast cancer Neg Hx    Social History   Tobacco Use  . Smoking status: Never Smoker  . Smokeless tobacco: Current User    Types: Snuff  Substance Use Topics  . Alcohol use: No  . Drug use: No   Current Outpatient Medications  Medication Sig Dispense Refill  . albuterol (PROVENTIL HFA;VENTOLIN HFA) 108 (90 BASE) MCG/ACT inhaler Inhale 2 puffs into the lungs every 6 (six) hours as needed. Shortness of breath 3 Inhaler 2  . amLODipine (NORVASC) 5 MG tablet TAKE 1 TABLET BY MOUTH EVERY DAY 90 tablet 3  . aspirin EC 81 MG tablet Take 81 mg by mouth at bedtime.    .Marland Kitchenatorvastatin (LIPITOR) 40 MG tablet Take 1 tablet (40 mg total) by mouth daily. 90 tablet 3  . Blood Glucose  Monitoring Suppl (BLOOD GLUCOSE MONITOR SYSTEM) w/Device KIT Dispense one touch per pts insurance. Checks BS 4x daily d/t fluctuating blood glucose. Dx: E11.65 1 each 1  . Cyanocobalamin (VITAMIN B 12 PO) Take 1 tablet by mouth at bedtime.     . diclofenac (VOLTAREN) 75 MG EC tablet TAKE 1 TABLET BY MOUTH TWICE A DAY 30 tablet 0  . Flaxseed, Linseed, (FLAXSEED OIL PO) Take 1 capsule by mouth 2 (two) times daily.     . Glucosamine Sulfate-MSM (MSM-GLUCOSAMINE PO) Take 1 capsule by mouth 2 (two) times daily.    . Glucose Blood (BLOOD GLUCOSE TEST STRIPS) STRP Dispense one touch strips. Checks BS 4x daily d/t fluctuating blood glucose. Dx: E11.65 150 each 11  . hydrochlorothiazide (HYDRODIURIL) 25 MG tablet TAKE 1 TABLET BY MOUTH EVERY DAY 90 tablet 3  . insulin NPH-regular Human (NOVOLIN 70/30) (70-30) 100 UNIT/ML injection 30 units SQ in the morning and 15 units sq in the afternoon 20 mL 11  . Insulin Pen Needle (B-D UF III MINI PEN NEEDLES) 31G X 5 MM MISC 1 each by Does not apply route daily. CHECK BS QID (FOUR TIMES A DAY) Dx e11.9 30 each 11  . Insulin Syringes, Disposable, U-100 1 ML MISC Use BID with insulin 60 each 11  . LANCETS ULTRA FINE MISC Dispense based on patient and insurance preference. Use to monitor FSBS 4x daily d/t  fluctuating blood glucose. Dx: E11.65 150 each 0  . lisinopril (PRINIVIL,ZESTRIL) 40 MG tablet Take 1 tablet (40 mg total) by mouth daily. Stop valsartan 90 tablet 3  . meclizine (ANTIVERT) 12.5 MG tablet TAKE 1 TABLET BY MOUTH 3 TIMES DAILY AS NEEDED FOR DIZZINESS. 40 tablet 0  . metFORMIN (GLUCOPHAGE) 1000 MG tablet TAKE 1 TABLET BY MOUTH TWICE A DAY WITH A MEAL 180 tablet 1  . pioglitazone (ACTOS) 30 MG tablet TAKE 1 TABLET BY MOUTH EVERY DAY 90 tablet 3   No current facility-administered medications for this visit.    Allergies  Allergen Reactions  . Codeine Itching  . Lisinopril Swelling     Review of Systems: All systems reviewed and negative except where  noted in HPI.   Lab Results  Component Value Date   WBC 5.9 02/13/2018   HGB 11.4 (L) 02/13/2018   HCT 34.4 (L) 02/13/2018   MCV 88.7 02/13/2018   PLT 266 02/13/2018    Lab Results  Component Value Date   CREATININE 1.32 (H) 02/13/2018   BUN 21 02/13/2018   NA 141 02/13/2018   K 4.4 02/13/2018   CL 105 02/13/2018   CO2 25 02/13/2018    No results found for: IRON, TIBC, FERRITIN    Physical Exam: BP 128/76   Pulse 64   Ht 5' 5.5" (1.664 m)   Wt 224 lb (101.6 kg)   BMI 36.71 kg/m  Constitutional: Pleasant,well-developed, female in no acute distress. HEENT: Normocephalic and atraumatic. Conjunctivae are normal. No scleral icterus. Neck supple.  Cardiovascular: Normal rate, regular rhythm.  Pulmonary/chest: Effort normal and breath sounds normal. No wheezing, rales or rhonchi. Abdominal: Soft, nondistended, nontender. . There are no masses palpable. No hepatomegaly. Extremities: no edema Lymphadenopathy: No cervical adenopathy noted. Neurological: Alert and oriented to person place and time. Skin: Skin is warm and dry. No rashes noted. Psychiatric: Normal mood and affect. Behavior is normal.   ASSESSMENT AND PLAN: 83 year old female here for new patient assessment to discuss whether or not she wants to have any further surveillance colonoscopies given her history of adenomatous polyps and her age.  She is due for surveillance colonoscopy at this time, however given her age we discussed whether or not she wanted to have any further exams. She has no alarm symptoms or problems with her bowels that bother her. Following discussion of risks and benefits of colonoscopy and anesthesia, she strongly wanted to avoid any further colonoscopies for screening purposes, which her daughter supported, and is reasonable. She can follow-up with [place] as needed for any issues moving forward.  Dupo Cellar, MD [facility]  CC: Susy Frizzle, MD

## 2018-08-13 ENCOUNTER — Other Ambulatory Visit: Payer: Self-pay | Admitting: Family Medicine

## 2018-08-14 ENCOUNTER — Encounter: Payer: Self-pay | Admitting: Family Medicine

## 2018-08-14 ENCOUNTER — Ambulatory Visit (INDEPENDENT_AMBULATORY_CARE_PROVIDER_SITE_OTHER): Payer: Medicare HMO | Admitting: Family Medicine

## 2018-08-14 VITALS — BP 134/68 | HR 78 | Temp 97.8°F | Resp 14 | Ht 65.5 in | Wt 220.0 lb

## 2018-08-14 DIAGNOSIS — Z23 Encounter for immunization: Secondary | ICD-10-CM

## 2018-08-14 DIAGNOSIS — E119 Type 2 diabetes mellitus without complications: Secondary | ICD-10-CM

## 2018-08-14 DIAGNOSIS — I10 Essential (primary) hypertension: Secondary | ICD-10-CM

## 2018-08-14 DIAGNOSIS — T783XXA Angioneurotic edema, initial encounter: Secondary | ICD-10-CM

## 2018-08-14 DIAGNOSIS — Z794 Long term (current) use of insulin: Secondary | ICD-10-CM | POA: Diagnosis not present

## 2018-08-14 DIAGNOSIS — E7800 Pure hypercholesterolemia, unspecified: Secondary | ICD-10-CM

## 2018-08-14 NOTE — Addendum Note (Signed)
Addended by: Shary Decamp B on: 08/14/2018 12:01 PM   Modules accepted: Orders

## 2018-08-14 NOTE — Progress Notes (Signed)
Subjective:    Patient ID: Amanda Riddle, female    DOB: 21-Apr-1935, 83 y.o.   MRN: [id number]  HPI  05/04/18 Yesterday, the patient had an episode where she felt swelling on her lower left side of her face, her lower left lip, her upper left lip, the swelling seemed to spread all across her lips.  It subsided by this morning.  She denied any tongue swelling or trouble breathing or trouble swallowing.  She denies any pain in her teeth are in her jaw.  On examination today she does have poor dentition but there is no visible dental abscess.  There is no erythema or swelling or fluctuance in the jaw.  There is no sinus pain to percussion.  She denies any swelling or pain over her parotid glands or her submandibular glands.  Symptoms are concerning for angioedema.  Patient is on lisinopril.  At that time, my plan was: Her exam today is normal.  Her story sounds consistent with angioedema secondary to an ACE inhibitor.  I have recommended discontinuation of lisinopril and clinical monitoring over the next week or so to see if symptoms recur.  May tentatively try an angiotensin receptor blocker in the future once we have verified if in fact this was the cause.  Recheck blood pressure in 1 week  08/14/18 Patient has not experienced any more angioedema since discontinuing lisinopril.  However she is now on no medication to prevent diabetic nephropathy.  She is on insulin 70/30, 30 units in the morning, 15 units in the evening.  She reports early morning hypoglycemia.  She states she has had several episodes but her blood sugars will drop between 2 and 3 in the morning.  Right now she is taking her 15 units right before bed, not at supper.  She is also not taking it with food.  Her last hemoglobin A1c was 6.4 in April.  She denies any random sugars greater than 200.  She denies any polyuria, polydipsia, or blurry vision.  She denies any chest pain shortness of breath or dyspnea on exertion Past Medical History:    Diagnosis Date  . Diabetes mellitus   . Diverticulosis   . Hypercholesteremia   . Hypertension    Past Surgical History:  Procedure Laterality Date  . ABDOMINAL HYSTERECTOMY  1986  . TONSILLECTOMY  1950   Current Outpatient Medications on File Prior to Visit  Medication Sig Dispense Refill  . albuterol (PROVENTIL HFA;VENTOLIN HFA) 108 (90 BASE) MCG/ACT inhaler Inhale 2 puffs into the lungs every 6 (six) hours as needed. Shortness of breath 3 Inhaler 2  . amLODipine (NORVASC) 5 MG tablet TAKE 1 TABLET BY MOUTH EVERY DAY 90 tablet 3  . aspirin EC 81 MG tablet Take 81 mg by mouth at bedtime.    Marland Kitchen atorvastatin (LIPITOR) 40 MG tablet Take 1 tablet (40 mg total) by mouth daily. 90 tablet 3  . Blood Glucose Monitoring Suppl (BLOOD GLUCOSE MONITOR SYSTEM) w/Device KIT Dispense one touch per pts insurance. Checks BS 4x daily d/t fluctuating blood glucose. Dx: E11.65 1 each 1  . Cyanocobalamin (VITAMIN B 12 PO) Take 1 tablet by mouth at bedtime.     . diclofenac (VOLTAREN) 75 MG EC tablet TAKE 1 TABLET BY MOUTH TWICE A DAY 30 tablet 0  . Flaxseed, Linseed, (FLAXSEED OIL PO) Take 1 capsule by mouth 2 (two) times daily.     . Glucosamine Sulfate-MSM (MSM-GLUCOSAMINE PO) Take 1 capsule by mouth 2 (two) times daily.    Marland Kitchen  Glucose Blood (BLOOD GLUCOSE TEST STRIPS) STRP Dispense one touch strips. Checks BS 4x daily d/t fluctuating blood glucose. Dx: E11.65 150 each 11  . hydrochlorothiazide (HYDRODIURIL) 25 MG tablet TAKE 1 TABLET BY MOUTH EVERY DAY 90 tablet 3  . insulin NPH-regular Human (NOVOLIN 70/30) (70-30) 100 UNIT/ML injection 30 units SQ in the morning and 15 units sq in the afternoon 20 mL 11  . Insulin Pen Needle (B-D UF III MINI PEN NEEDLES) 31G X 5 MM MISC 1 each by Does not apply route daily. CHECK BS QID (FOUR TIMES A DAY) Dx e11.9 30 each 11  . Insulin Syringes, Disposable, U-100 1 ML MISC Use BID with insulin 60 each 11  . LANCETS ULTRA FINE MISC Dispense based on patient and insurance  preference. Use to monitor FSBS 4x daily d/t fluctuating blood glucose. Dx: E11.65 150 each 0  . lisinopril (PRINIVIL,ZESTRIL) 40 MG tablet Take 1 tablet (40 mg total) by mouth daily. Stop valsartan 90 tablet 3  . meclizine (ANTIVERT) 12.5 MG tablet TAKE 1 TABLET BY MOUTH 3 TIMES DAILY AS NEEDED FOR DIZZINESS. 40 tablet 0  . metFORMIN (GLUCOPHAGE) 1000 MG tablet TAKE 1 TABLET BY MOUTH TWICE A DAY WITH A MEAL 180 tablet 1  . pioglitazone (ACTOS) 30 MG tablet TAKE 1 TABLET BY MOUTH EVERY DAY 90 tablet 3   No current facility-administered medications on file prior to visit.    Allergies  Allergen Reactions  . Codeine Itching  . Lisinopril Swelling   Social History   Socioeconomic History  . Marital status: Divorced    Spouse name: Not on file  . Number of children: 2  . Years of education: Not on file  . Highest education level: Not on file  Occupational History  . Not on file  Social Needs  . Financial resource strain: Not on file  . Food insecurity:    Worry: Not on file    Inability: Not on file  . Transportation needs:    Medical: Not on file    Non-medical: Not on file  Tobacco Use  . Smoking status: Never Smoker  . Smokeless tobacco: Current User    Types: Snuff  Substance and Sexual Activity  . Alcohol use: No  . Drug use: No  . Sexual activity: Not on file  Lifestyle  . Physical activity:    Days per week: Not on file    Minutes per session: Not on file  . Stress: Not on file  Relationships  . Social connections:    Talks on phone: Not on file    Gets together: Not on file    Attends religious service: Not on file    Active member of club or organization: Not on file    Attends meetings of clubs or organizations: Not on file    Relationship status: Not on file  . Intimate partner violence:    Fear of current or ex partner: Not on file    Emotionally abused: Not on file    Physically abused: Not on file    Forced sexual activity: Not on file  Other Topics  Concern  . Not on file  Social History Narrative  . Not on file      Review of Systems  All other systems reviewed and are negative.      Objective:   Physical Exam  Constitutional: She appears well-developed and well-nourished. No distress.  Neck: Neck supple. No JVD present.  Cardiovascular: Normal rate, regular rhythm, normal  heart sounds and intact distal pulses.  No murmur heard. Pulmonary/Chest: Effort normal and breath sounds normal. No respiratory distress. She has no wheezes. She has no rales.  Abdominal: Soft. Bowel sounds are normal. She exhibits no distension. There is no tenderness. There is no rebound and no guarding.  Musculoskeletal: She exhibits no edema.  Lymphadenopathy:    She has no cervical adenopathy.  Skin: She is not diaphoretic.  Vitals reviewed.         Assessment & Plan:  Controlled type 2 diabetes mellitus without complication, with long-term current use of insulin (HCC) - Plan: Hemoglobin A1c, CBC with Differential/Platelet, COMPLETE METABOLIC PANEL WITH GFR, Lipid panel, Microalbumin, urine  Pure hypercholesterolemia  Essential hypertension  Angioedema, initial encounter  Angioedema has resolved since discontinuing lisinopril.  Blood pressures well controlled at 134/68 however she is on nothing to protect her from diabetic nephropathy.  I will check a hemoglobin A1c today.  If less than 7 as I anticipate, I would recommend decreasing her 70/30 to 30 units in the morning and 5 units with dinner.  I also recommended that she take her evening insulin with supper to avoid hypoglycemia.  Check a fasting lipid panel.  Goal LDL cholesterol is less than 100.  Patient was given a flu shot.

## 2018-08-15 LAB — COMPLETE METABOLIC PANEL WITH GFR
AG Ratio: 1.6 (calc) (ref 1.0–2.5)
ALBUMIN MSPROF: 4.1 g/dL (ref 3.6–5.1)
ALT: 9 U/L (ref 6–29)
AST: 15 U/L (ref 10–35)
Alkaline phosphatase (APISO): 80 U/L (ref 33–130)
BILIRUBIN TOTAL: 0.9 mg/dL (ref 0.2–1.2)
BUN / CREAT RATIO: 12 (calc) (ref 6–22)
BUN: 15 mg/dL (ref 7–25)
CHLORIDE: 104 mmol/L (ref 98–110)
CO2: 27 mmol/L (ref 20–32)
Calcium: 9.6 mg/dL (ref 8.6–10.4)
Creat: 1.21 mg/dL — ABNORMAL HIGH (ref 0.60–0.88)
GFR, Est African American: 49 mL/min/{1.73_m2} — ABNORMAL LOW (ref >=60)
GFR, Est Non African American: 42 mL/min/{1.73_m2} — ABNORMAL LOW (ref >=60)
GLUCOSE: 54 mg/dL — AB (ref 65–99)
Globulin: 2.6 g/dL (calc) (ref 1.9–3.7)
Potassium: 3.8 mmol/L (ref 3.5–5.3)
SODIUM: 142 mmol/L (ref 135–146)
Total Protein: 6.7 g/dL (ref 6.1–8.1)

## 2018-08-15 LAB — CBC WITH DIFFERENTIAL/PLATELET
Basophils Absolute: 30 cells/uL (ref 0–200)
Basophils Relative: 0.6 %
EOS ABS: 80 {cells}/uL (ref 15–500)
Eosinophils Relative: 1.6 %
HCT: 38.1 % (ref 35.0–45.0)
HEMOGLOBIN: 12.5 g/dL (ref 11.7–15.5)
Lymphs Abs: 2440 cells/uL (ref 850–3900)
MCH: 29.7 pg (ref 27.0–33.0)
MCHC: 32.8 g/dL (ref 32.0–36.0)
MCV: 90.5 fL (ref 80.0–100.0)
MONOS PCT: 5.8 %
MPV: 9.9 fL (ref 7.5–12.5)
NEUTROS ABS: 2160 {cells}/uL (ref 1500–7800)
Neutrophils Relative %: 43.2 %
Platelets: 285 10*3/uL (ref 140–400)
RBC: 4.21 10*6/uL (ref 3.80–5.10)
RDW: 13.5 % (ref 11.0–15.0)
TOTAL LYMPHOCYTE: 48.8 %
WBC mixed population: 290 cells/uL (ref 200–950)
WBC: 5 10*3/uL (ref 3.8–10.8)

## 2018-08-15 LAB — MICROALBUMIN, URINE: Microalb, Ur: 0.5 mg/dL

## 2018-08-15 LAB — LIPID PANEL
CHOLESTEROL: 189 mg/dL (ref <200)
HDL: 76 mg/dL (ref >50)
LDL CHOLESTEROL (CALC): 94 mg/dL
NON-HDL CHOLESTEROL (CALC): 113 mg/dL (ref <130)
Total CHOL/HDL Ratio: 2.5 (calc) (ref <5.0)
Triglycerides: 96 mg/dL (ref <150)

## 2018-08-15 LAB — HEMOGLOBIN A1C
HEMOGLOBIN A1C: 6.2 %{Hb} — AB (ref <5.7)
MEAN PLASMA GLUCOSE: 131 (calc)
eAG (mmol/L): 7.3 (calc)

## 2018-08-21 ENCOUNTER — Other Ambulatory Visit: Payer: Self-pay | Admitting: *Deleted

## 2018-08-21 MED ORDER — INSULIN NPH ISOPHANE & REGULAR (70-30) 100 UNIT/ML ~~LOC~~ SUSP: SUBCUTANEOUS | 11 refills | Status: DC

## 2018-11-02 DIAGNOSIS — Z9842 Cataract extraction status, left eye: Secondary | ICD-10-CM | POA: Diagnosis not present

## 2018-11-02 DIAGNOSIS — H5200 Hypermetropia, unspecified eye: Secondary | ICD-10-CM | POA: Diagnosis not present

## 2018-11-02 DIAGNOSIS — Z961 Presence of intraocular lens: Secondary | ICD-10-CM | POA: Diagnosis not present

## 2018-11-02 DIAGNOSIS — H25811 Combined forms of age-related cataract, right eye: Secondary | ICD-10-CM | POA: Diagnosis not present

## 2018-11-02 LAB — HM DIABETES EYE EXAM

## 2018-11-07 ENCOUNTER — Encounter: Payer: Self-pay | Admitting: *Deleted

## 2018-11-13 ENCOUNTER — Other Ambulatory Visit: Payer: Self-pay | Admitting: Family Medicine

## 2018-11-23 ENCOUNTER — Encounter: Payer: Self-pay | Admitting: Family Medicine

## 2018-11-23 ENCOUNTER — Ambulatory Visit (INDEPENDENT_AMBULATORY_CARE_PROVIDER_SITE_OTHER): Payer: Medicare HMO | Admitting: Family Medicine

## 2018-11-23 VITALS — BP 122/68 | HR 70 | Temp 97.6°F | Resp 12 | Ht 65.5 in | Wt 215.0 lb

## 2018-11-23 DIAGNOSIS — E118 Type 2 diabetes mellitus with unspecified complications: Secondary | ICD-10-CM | POA: Diagnosis not present

## 2018-11-23 DIAGNOSIS — Z794 Long term (current) use of insulin: Secondary | ICD-10-CM | POA: Diagnosis not present

## 2018-11-23 MED ORDER — GABAPENTIN 100 MG PO CAPS: 100.0000 mg | ORAL_CAPSULE | Freq: Three times a day (TID) | ORAL | 3 refills | Status: DC | PRN
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
## 2018-11-24 LAB — MICROALBUMIN, URINE: Microalb, Ur: 0.4 mg/dL

## 2018-11-24 LAB — CBC WITH DIFFERENTIAL/PLATELET
ABSOLUTE MONOCYTES: 383 {cells}/uL (ref 200–950)
BASOS PCT: 0.7 %
Basophils Absolute: 41 cells/uL (ref 0–200)
Eosinophils Absolute: 70 cells/uL (ref 15–500)
Eosinophils Relative: 1.2 %
HEMATOCRIT: 37.9 % (ref 35.0–45.0)
Hemoglobin: 12.3 g/dL (ref 11.7–15.5)
LYMPHS ABS: 2743 {cells}/uL (ref 850–3900)
MCH: 29.3 pg (ref 27.0–33.0)
MCHC: 32.5 g/dL (ref 32.0–36.0)
MCV: 90.2 fL (ref 80.0–100.0)
MPV: 9.7 fL (ref 7.5–12.5)
Monocytes Relative: 6.6 %
NEUTROS PCT: 44.2 %
Neutro Abs: 2564 cells/uL (ref 1500–7800)
Platelets: 277 10*3/uL (ref 140–400)
RBC: 4.2 10*6/uL (ref 3.80–5.10)
RDW: 13.5 % (ref 11.0–15.0)
Total Lymphocyte: 47.3 %
WBC: 5.8 10*3/uL (ref 3.8–10.8)

## 2018-11-24 LAB — COMPLETE METABOLIC PANEL WITHOUT GFR
AG Ratio: 1.6 (calc) (ref 1.0–2.5)
ALT: 8 U/L (ref 6–29)
AST: 15 U/L (ref 10–35)
Albumin: 4 g/dL (ref 3.6–5.1)
Alkaline phosphatase (APISO): 85 U/L (ref 37–153)
BUN/Creatinine Ratio: 10 (calc) (ref 6–22)
BUN: 13 mg/dL (ref 7–25)
CO2: 26 mmol/L (ref 20–32)
Calcium: 9.5 mg/dL (ref 8.6–10.4)
Chloride: 106 mmol/L (ref 98–110)
Creat: 1.24 mg/dL — ABNORMAL HIGH (ref 0.60–0.88)
GFR, Est African American: 47 mL/min/1.73m2 — ABNORMAL LOW
GFR, Est Non African American: 41 mL/min/1.73m2 — ABNORMAL LOW
Globulin: 2.5 g/dL (ref 1.9–3.7)
Glucose, Bld: 76 mg/dL (ref 65–99)
Potassium: 3.6 mmol/L (ref 3.5–5.3)
Sodium: 143 mmol/L (ref 135–146)
Total Bilirubin: 0.8 mg/dL (ref 0.2–1.2)
Total Protein: 6.5 g/dL (ref 6.1–8.1)

## 2018-11-24 LAB — LIPID PANEL
Cholesterol: 168 mg/dL
HDL: 75 mg/dL
LDL Cholesterol (Calc): 75 mg/dL
Non-HDL Cholesterol (Calc): 93 mg/dL
Total CHOL/HDL Ratio: 2.2 (calc)
Triglycerides: 92 mg/dL

## 2018-11-24 LAB — HEMOGLOBIN A1C
Hgb A1c MFr Bld: 6.6 %{Hb} — ABNORMAL HIGH
Mean Plasma Glucose: 143 (calc)
eAG (mmol/L): 7.9 (calc)

## 2018-11-25 ENCOUNTER — Encounter: Payer: Self-pay | Admitting: Family Medicine

## 2019-02-02 IMAGING — MG DIGITAL SCREENING BILATERAL MAMMOGRAM WITH TOMO AND CAD
8 series · 8 of 24 positions shown · non-contrast
Comparison: Previous exam(s).

CLINICAL DATA: Screening.

EXAM:
DIGITAL SCREENING BILATERAL MAMMOGRAM WITH TOMO AND CAD

[L MLO synth-2D]
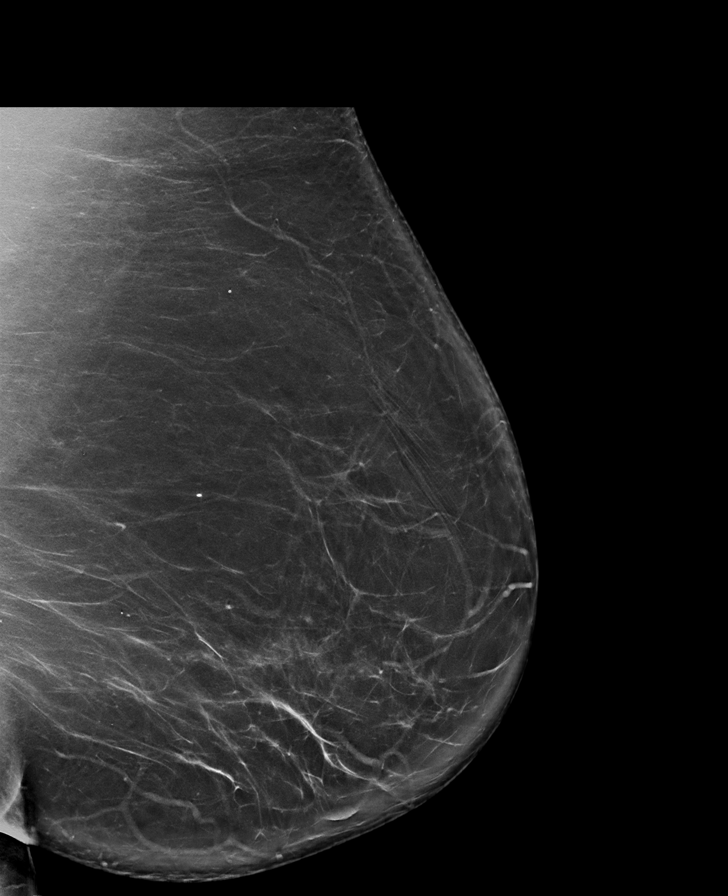

[R MLO synth-2D]
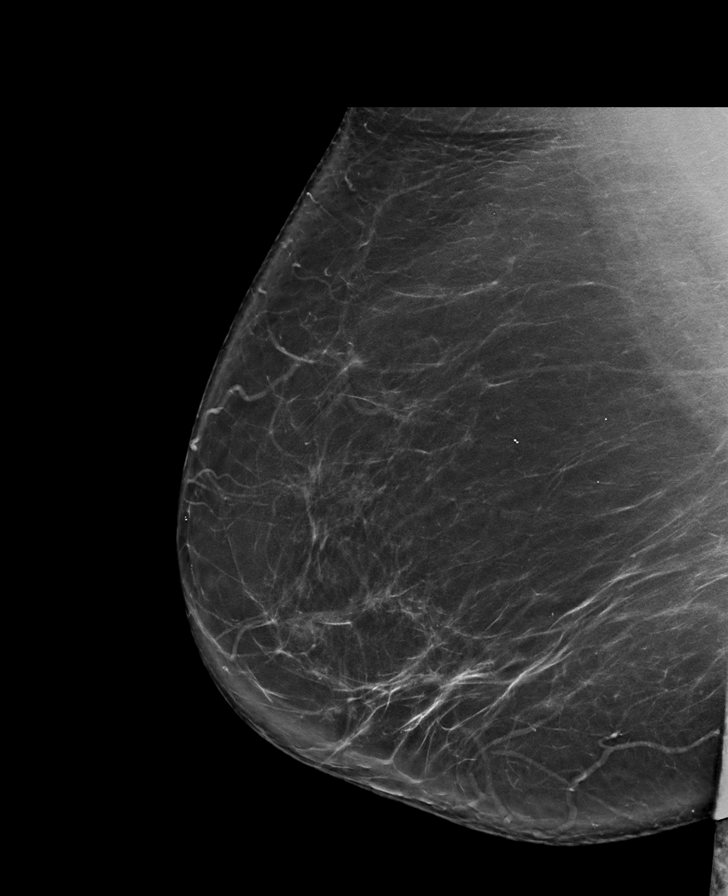

[R CC synth-2D]
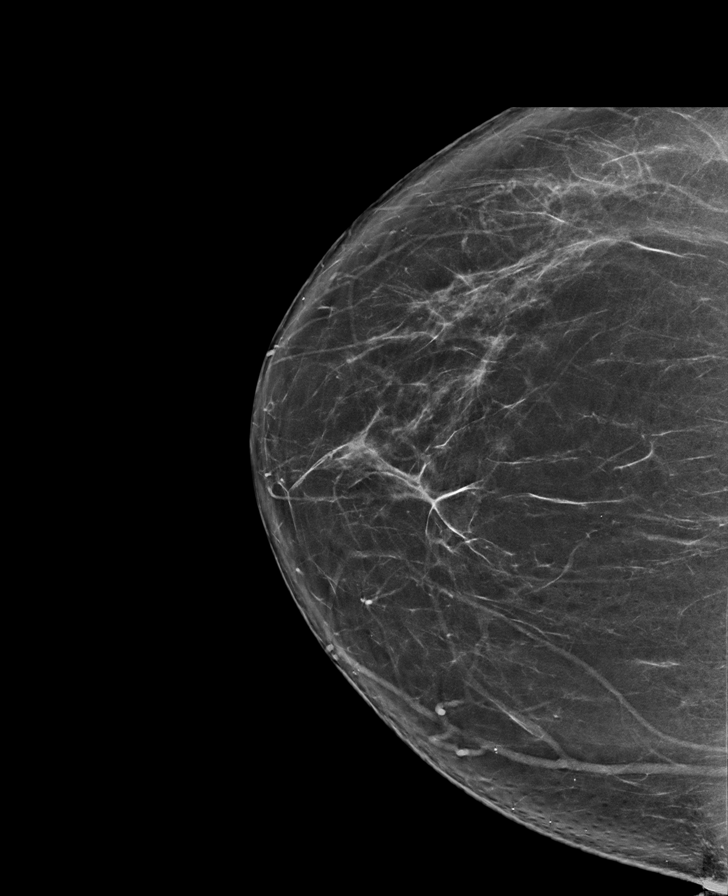

[L CC synth-2D]
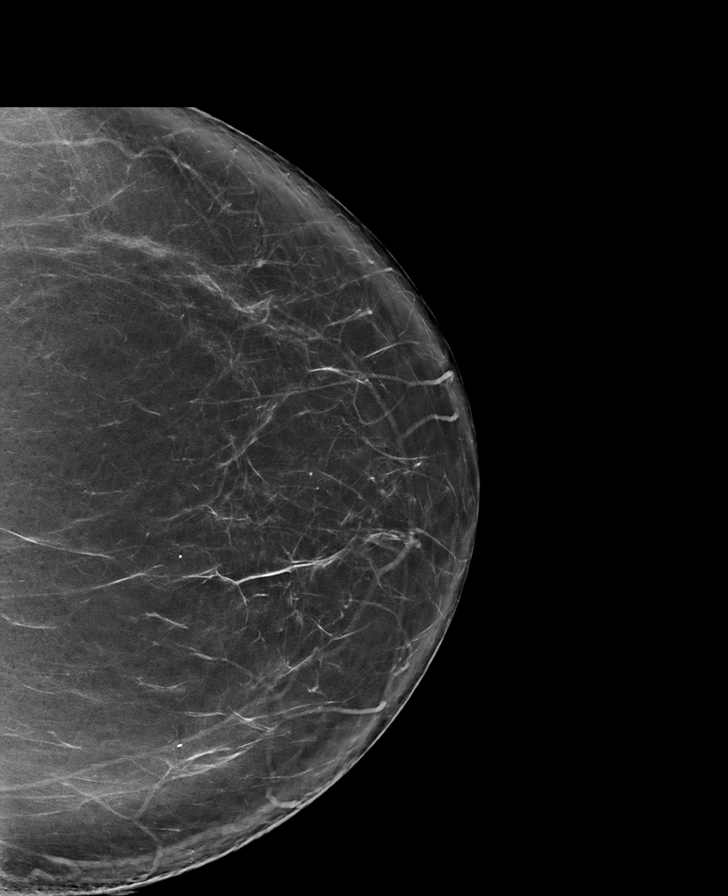

[R MLO tomo · tomo slice 49/96.0]
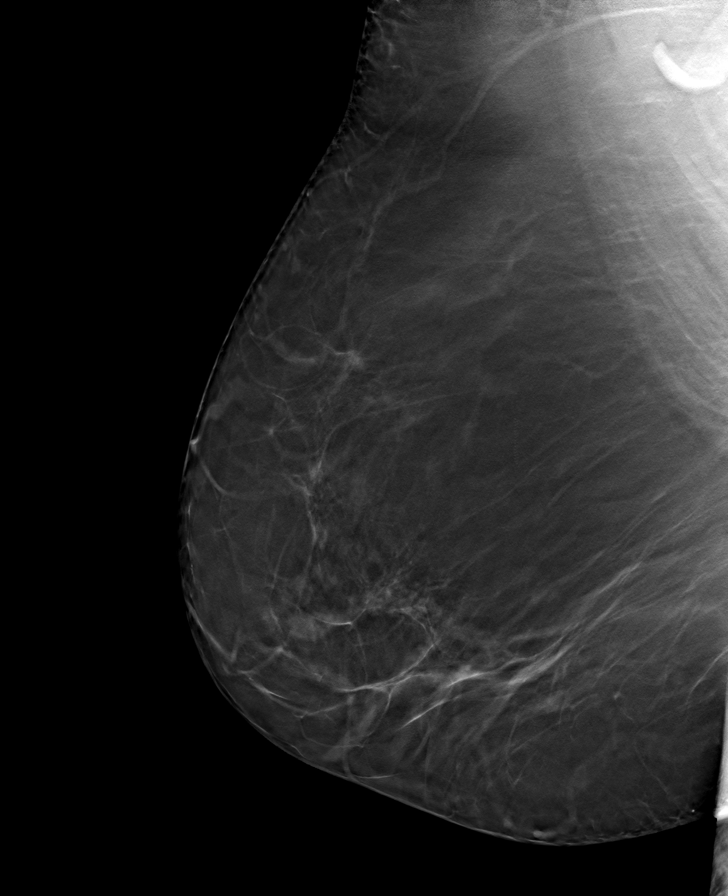

[L CC tomo · tomo slice 45/89.0]
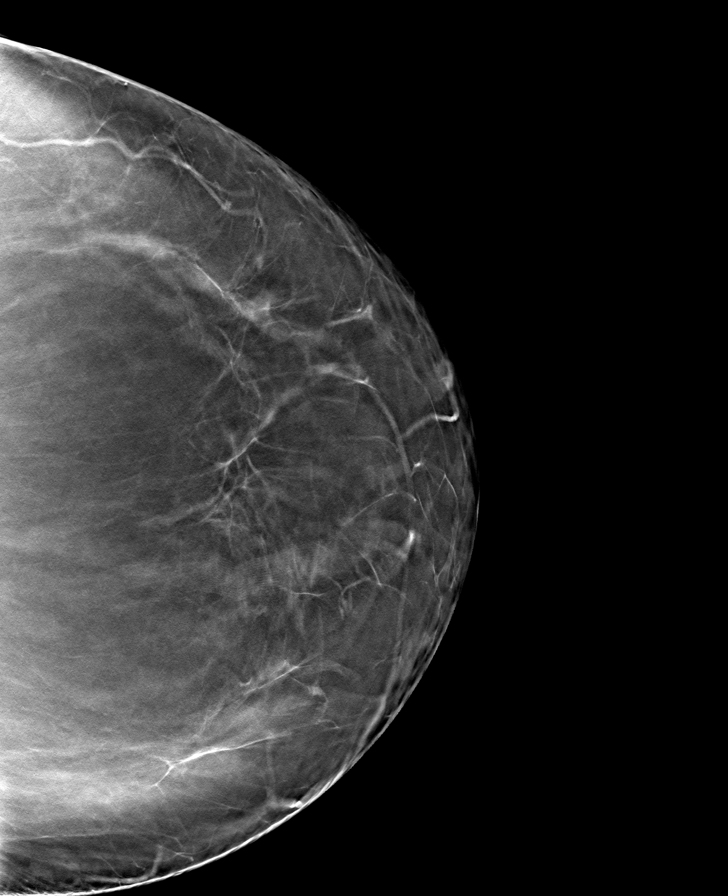

[L MLO tomo · tomo slice 51/101.0]
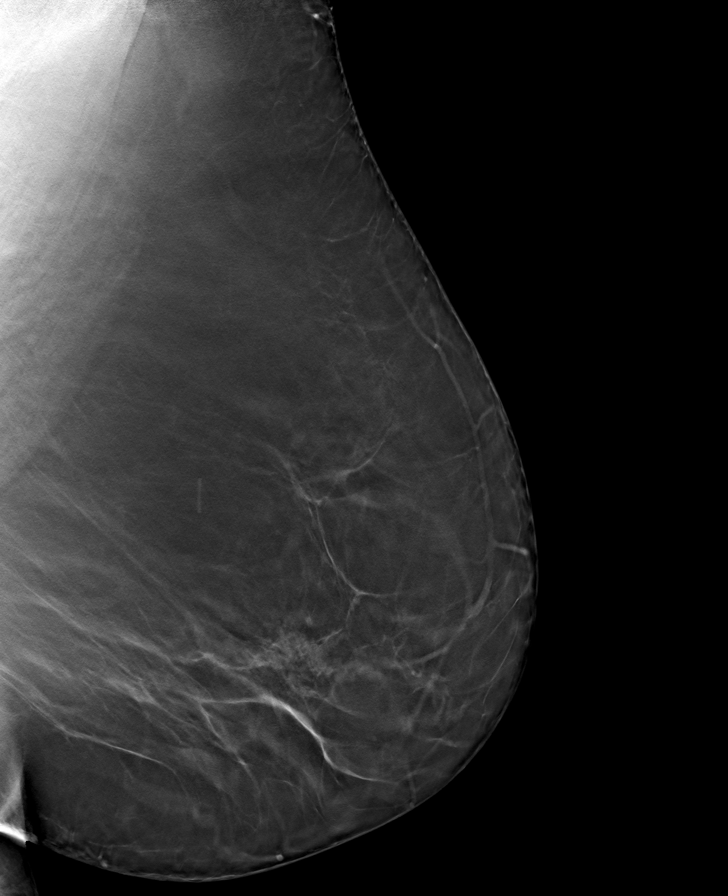

[R CC tomo · tomo slice 43/85.0]
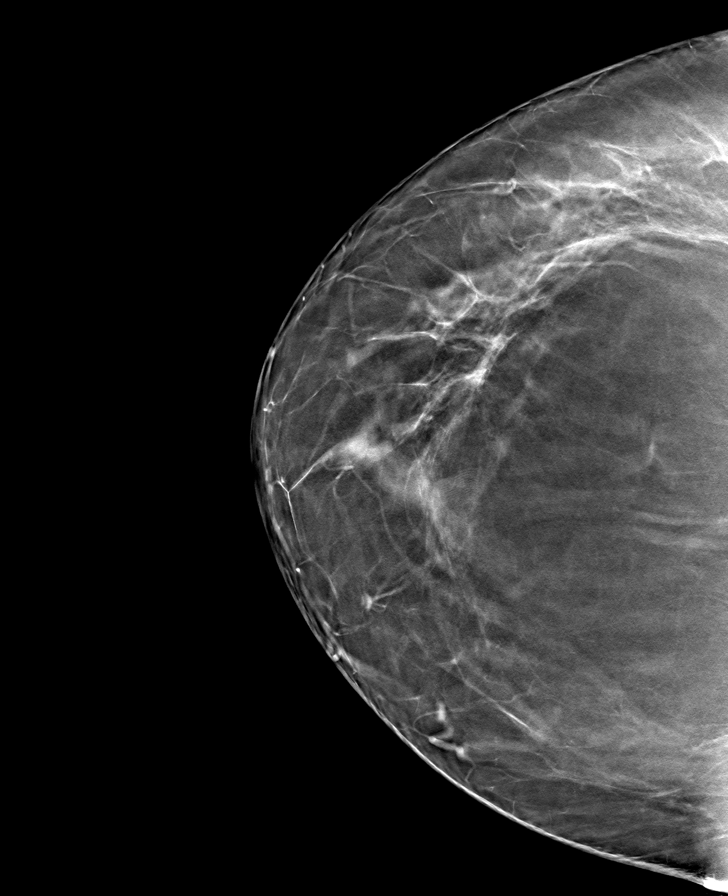

[8 of 24 positions shown; findings below may reference images not displayed]

ACR Breast Density Category b: There are scattered areas of
fibroglandular density.
FINDINGS: There are no findings suspicious for malignancy. Images were
processed with CAD.
IMPRESSION: No mammographic evidence of malignancy. A result letter of this
screening mammogram will be mailed directly to the patient.

RECOMMENDATION:
Screening mammogram in one year. (Code:[id number])

BI-RADS CATEGORY  1: Negative.

## 2019-02-08 ENCOUNTER — Other Ambulatory Visit: Payer: Self-pay | Admitting: Family Medicine

## 2019-02-08 DIAGNOSIS — I10 Essential (primary) hypertension: Secondary | ICD-10-CM

## 2019-04-01 ENCOUNTER — Other Ambulatory Visit: Payer: Self-pay | Admitting: Family Medicine

## 2019-04-12 ENCOUNTER — Other Ambulatory Visit: Payer: Self-pay | Admitting: *Deleted

## 2019-04-12 MED ORDER — GABAPENTIN 100 MG PO CAPS: 100.0000 mg | ORAL_CAPSULE | Freq: Three times a day (TID) | ORAL | 3 refills | Status: DC | PRN

## 2019-05-06 ENCOUNTER — Other Ambulatory Visit: Payer: Self-pay

## 2019-05-06 ENCOUNTER — Encounter: Payer: Self-pay | Admitting: Family Medicine

## 2019-05-07 ENCOUNTER — Encounter: Payer: Self-pay | Admitting: Family Medicine

## 2019-05-07 ENCOUNTER — Ambulatory Visit (INDEPENDENT_AMBULATORY_CARE_PROVIDER_SITE_OTHER): Payer: Medicare HMO | Admitting: Family Medicine

## 2019-05-07 VITALS — BP 119/70 | HR 70 | Temp 98.3°F | Resp 18 | Ht 65.5 in | Wt 236.0 lb

## 2019-05-07 DIAGNOSIS — R350 Frequency of micturition: Secondary | ICD-10-CM

## 2019-05-07 DIAGNOSIS — M7989 Other specified soft tissue disorders: Secondary | ICD-10-CM

## 2019-05-07 DIAGNOSIS — Z794 Long term (current) use of insulin: Secondary | ICD-10-CM | POA: Diagnosis not present

## 2019-05-07 DIAGNOSIS — E118 Type 2 diabetes mellitus with unspecified complications: Secondary | ICD-10-CM | POA: Diagnosis not present

## 2019-05-07 LAB — URINALYSIS, ROUTINE W REFLEX MICROSCOPIC
Bilirubin Urine: NEGATIVE
Glucose, UA: NEGATIVE
Hgb urine dipstick: NEGATIVE
Ketones, ur: NEGATIVE
Leukocytes,Ua: NEGATIVE
Nitrite: NEGATIVE
Protein, ur: NEGATIVE
Specific Gravity, Urine: 1.02 (ref 1.001–1.03)
pH: 5 (ref 5.0–8.0)

## 2019-05-07 MED ORDER — ATORVASTATIN CALCIUM 40 MG PO TABS: 40.0000 mg | ORAL_TABLET | Freq: Every day | ORAL | 1 refills | Status: DC

## 2019-05-07 MED ORDER — METFORMIN HCL 1000 MG PO TABS: ORAL_TABLET | ORAL | 1 refills | Status: DC

## 2019-05-07 NOTE — Telephone Encounter (Signed)
Pt seen in office on 05/07/19

## 2019-05-07 NOTE — Progress Notes (Signed)
Subjective:    Patient ID: Amanda Riddle, female    DOB: [birth year], 84 y.o.   MRN: [id number]  HPI  Patient is here today for swelling in her legs.  Since I last saw the patient in February she is gained approximately 15 pounds.  In the afternoon she has +1 edema in her ankles and her shins.  This resolved by the following morning.  However at night she has to awaken 2-3 times to urinate.  She denies any dysuria or hematuria.  She denies any urgency or frequency during the day.  She has no CVA tenderness. Patient denies any chest pain, shortness of breath, dyspnea on exertion, orthopnea, or paroxysmal nocturnal dyspnea. Past Medical History:  Diagnosis Date  . Diabetes mellitus   . Diverticulosis   . Hypercholesteremia   . Hypertension    Past Surgical History:  Procedure Laterality Date  . ABDOMINAL HYSTERECTOMY  1986  . TONSILLECTOMY  1950   Current Outpatient Medications on File Prior to Visit  Medication Sig Dispense Refill  . albuterol (PROVENTIL HFA;VENTOLIN HFA) 108 (90 BASE) MCG/ACT inhaler Inhale 2 puffs into the lungs every 6 (six) hours as needed. Shortness of breath 3 Inhaler 2  . amLODipine (NORVASC) 5 MG tablet TAKE 1 TABLET BY MOUTH EVERY DAY 90 tablet 3  . aspirin EC 81 MG tablet Take 81 mg by mouth at bedtime.    . Blood Glucose Monitoring Suppl (BLOOD GLUCOSE MONITOR SYSTEM) w/Device KIT Dispense one touch per pts insurance. Checks BS 4x daily d/t fluctuating blood glucose. Dx: E11.65 1 each 1  . Cyanocobalamin (VITAMIN B 12 PO) Take 1 tablet by mouth at bedtime.     . diclofenac (VOLTAREN) 75 MG EC tablet TAKE 1 TABLET BY MOUTH TWICE A DAY 30 tablet 0  . Flaxseed, Linseed, (FLAXSEED OIL PO) Take 1 capsule by mouth 2 (two) times daily.     Marland Kitchen gabapentin (NEURONTIN) 100 MG capsule Take 1 capsule (100 mg total) by mouth 3 (three) times daily as needed (nerve pain). 90 capsule 3  . Glucosamine Sulfate-MSM (MSM-GLUCOSAMINE PO) Take 1 capsule by mouth 2 (two) times daily.     . Glucose Blood (BLOOD GLUCOSE TEST STRIPS) STRP Dispense one touch strips. Checks BS 4x daily d/t fluctuating blood glucose. Dx: E11.65 150 each 11  . hydrochlorothiazide (HYDRODIURIL) 25 MG tablet TAKE 1 TABLET BY MOUTH EVERY DAY 90 tablet 3  . Insulin Pen Needle (B-D UF III MINI PEN NEEDLES) 31G X 5 MM MISC 1 each by Does not apply route daily. CHECK BS QID (FOUR TIMES A DAY) Dx e11.9 30 each 11  . Insulin Syringes, Disposable, U-100 1 ML MISC Use BID with insulin 60 each 11  . LANCETS ULTRA FINE MISC Dispense based on patient and insurance preference. Use to monitor FSBS 4x daily d/t fluctuating blood glucose. Dx: E11.65 150 each 0  . meclizine (ANTIVERT) 12.5 MG tablet TAKE 1 TABLET BY MOUTH 3 TIMES DAILY AS NEEDED FOR DIZZINESS. 40 tablet 0  . NOVOLIN 70/30 RELION (70-30) 100 UNIT/ML injection INJECT 30 UNITS SUBCUTANEOUSLY IN THE MORNING AND  15 UNITS SUBCUTANEOUSLY IN THE AFTERNOON 20 mL 0  . pioglitazone (ACTOS) 30 MG tablet TAKE 1 TABLET BY MOUTH EVERY DAY 90 tablet 3   No current facility-administered medications on file prior to visit.    Allergies  Allergen Reactions  . Codeine Itching  . Lisinopril Swelling   Social History   Socioeconomic History  . Marital status: Divorced  Spouse name: Not on file  . Number of children: 2  . Years of education: Not on file  . Highest education level: Not on file  Occupational History  . Not on file  Social Needs  . Financial resource strain: Not on file  . Food insecurity    Worry: Not on file    Inability: Not on file  . Transportation needs    Medical: Not on file    Non-medical: Not on file  Tobacco Use  . Smoking status: Never Smoker  . Smokeless tobacco: Current User    Types: Snuff  Substance and Sexual Activity  . Alcohol use: No  . Drug use: No  . Sexual activity: Not on file  Lifestyle  . Physical activity    Days per week: Not on file    Minutes per session: Not on file  . Stress: Not on file   Relationships  . Social Herbalist on phone: Not on file    Gets together: Not on file    Attends religious service: Not on file    Active member of club or organization: Not on file    Attends meetings of clubs or organizations: Not on file    Relationship status: Not on file  . Intimate partner violence    Fear of current or ex partner: Not on file    Emotionally abused: Not on file    Physically abused: Not on file    Forced sexual activity: Not on file  Other Topics Concern  . Not on file  Social History Narrative  . Not on file     Review of Systems  All other systems reviewed and are negative.      Objective:   Physical Exam Vitals signs reviewed.  Constitutional:      Appearance: She is obese.  Cardiovascular:     Rate and Rhythm: Normal rate and regular rhythm.     Heart sounds: Normal heart sounds. No murmur. No friction rub. No gallop.   Pulmonary:     Effort: Pulmonary effort is normal. No respiratory distress.     Breath sounds: Normal breath sounds. No stridor. No wheezing, rhonchi or rales.  Chest:     Chest wall: No tenderness.  Musculoskeletal:     Right lower leg: No edema.     Left lower leg: No edema.  Neurological:     Mental Status: She is alert.           Assessment & Plan:  The primary encounter diagnosis was Frequent urination. Diagnoses of Controlled type 2 diabetes mellitus with complication, with long-term current use of insulin (HCC) and Leg swelling were also pertinent to this visit. I believe the patient's leg swelling is likely due to a combination of the amlodipine and the pioglitazone as well as the gabapentin.  I think the fluid accumulates in her ankles during the day and then when she lays down at night, the fluid returns to the intravascular space and is carried to her kidneys where she ultimately has to have increased frequency of urination.  Urinalysis is unremarkable.  Obtain CBC, CMP, hemoglobin A1c and also a  BNP.  If all labs are normal, consider discontinuation of Actos or replacing it with something else to see if this helps with the leg swelling and ultimately helps decrease her frequency of urination at night.

## 2019-05-08 LAB — COMPLETE METABOLIC PANEL WITH GFR
AG Ratio: 1.4 (calc) (ref 1.0–2.5)
ALT: 9 U/L (ref 6–29)
AST: 16 U/L (ref 10–35)
Albumin: 3.7 g/dL (ref 3.6–5.1)
Alkaline phosphatase (APISO): 90 U/L (ref 37–153)
BUN/Creatinine Ratio: 21 (calc) (ref 6–22)
BUN: 24 mg/dL (ref 7–25)
CO2: 24 mmol/L (ref 20–32)
Calcium: 9 mg/dL (ref 8.6–10.4)
Chloride: 108 mmol/L (ref 98–110)
Creat: 1.15 mg/dL — ABNORMAL HIGH (ref 0.60–0.88)
GFR, Est African American: 51 mL/min/{1.73_m2} — ABNORMAL LOW (ref >=60)
GFR, Est Non African American: 44 mL/min/{1.73_m2} — ABNORMAL LOW (ref >=60)
Globulin: 2.7 g/dL (calc) (ref 1.9–3.7)
Glucose, Bld: 146 mg/dL — ABNORMAL HIGH (ref 65–99)
Potassium: 4.1 mmol/L (ref 3.5–5.3)
Sodium: 143 mmol/L (ref 135–146)
Total Bilirubin: 0.9 mg/dL (ref 0.2–1.2)
Total Protein: 6.4 g/dL (ref 6.1–8.1)

## 2019-05-08 LAB — CBC WITH DIFFERENTIAL/PLATELET
Absolute Monocytes: 462 cells/uL (ref 200–950)
Basophils Absolute: 39 cells/uL (ref 0–200)
Basophils Relative: 0.7 %
Eosinophils Absolute: 83 cells/uL (ref 15–500)
Eosinophils Relative: 1.5 %
HCT: 36.2 % (ref 35.0–45.0)
Hemoglobin: 12.1 g/dL (ref 11.7–15.5)
Lymphs Abs: 2360 cells/uL (ref 850–3900)
MCH: 30 pg (ref 27.0–33.0)
MCHC: 33.4 g/dL (ref 32.0–36.0)
MCV: 89.6 fL (ref 80.0–100.0)
MPV: 9.7 fL (ref 7.5–12.5)
Monocytes Relative: 8.4 %
Neutro Abs: 2558 cells/uL (ref 1500–7800)
Neutrophils Relative %: 46.5 %
Platelets: 262 10*3/uL (ref 140–400)
RBC: 4.04 10*6/uL (ref 3.80–5.10)
RDW: 13.5 % (ref 11.0–15.0)
Total Lymphocyte: 42.9 %
WBC: 5.5 10*3/uL (ref 3.8–10.8)

## 2019-05-08 LAB — HEMOGLOBIN A1C
Hgb A1c MFr Bld: 7.1 % of total Hgb — ABNORMAL HIGH (ref <5.7)
Mean Plasma Glucose: 157 (calc)
eAG (mmol/L): 8.7 (calc)

## 2019-05-08 LAB — BRAIN NATRIURETIC PEPTIDE: Brain Natriuretic Peptide: 25 pg/mL (ref <100)

## 2019-05-12 ENCOUNTER — Telehealth: Payer: Self-pay | Admitting: Family Medicine

## 2019-05-12 NOTE — Telephone Encounter (Signed)
Pt needs lab results 

## 2019-05-14 NOTE — Telephone Encounter (Signed)
Pt aware of labs see results note

## 2019-06-21 ENCOUNTER — Other Ambulatory Visit: Payer: Self-pay | Admitting: Family Medicine

## 2019-07-23 ENCOUNTER — Other Ambulatory Visit: Payer: Self-pay

## 2019-07-23 ENCOUNTER — Ambulatory Visit (INDEPENDENT_AMBULATORY_CARE_PROVIDER_SITE_OTHER): Payer: Medicare HMO | Admitting: Family Medicine

## 2019-07-23 VITALS — BP 110/70 | HR 70 | Temp 98.2°F | Resp 14 | Ht 65.5 in | Wt 237.0 lb

## 2019-07-23 DIAGNOSIS — Z23 Encounter for immunization: Secondary | ICD-10-CM | POA: Diagnosis not present

## 2019-07-23 DIAGNOSIS — Z794 Long term (current) use of insulin: Secondary | ICD-10-CM

## 2019-07-23 DIAGNOSIS — E118 Type 2 diabetes mellitus with unspecified complications: Secondary | ICD-10-CM

## 2019-07-23 NOTE — Addendum Note (Signed)
Addended by: Shary Decamp B on: 07/23/2019 11:09 AM   Modules accepted: Orders

## 2019-07-23 NOTE — Progress Notes (Signed)
Subjective:    Patient ID: Amanda Riddle, female    DOB: 1935/10/09, 84 y.o.   MRN: [id number]  HPI  Patient presents today for follow-up of her diabetes.  Somehow she has changed the way she administers her insulin.  She is on 70/30.  However she is taking 30 units in the morning and no insulin in the afternoon.  She is not regularly checking her sugars however she states that the highest blood sugar she has seen is 120.  She denies any hypoglycemia.  She denies any neuropathy in her feet.  She denies any numbness in her feet.  She denies any chest pain shortness of breath or dyspnea on exertion.  She denies any polyuria, polydipsia, or blurry visions.  Blood pressure today is well controlled at 110/70. Past Medical History:  Diagnosis Date  . Diabetes mellitus   . Diverticulosis   . Hypercholesteremia   . Hypertension    Past Surgical History:  Procedure Laterality Date  . ABDOMINAL HYSTERECTOMY  1986  . TONSILLECTOMY  1950   Current Outpatient Medications on File Prior to Visit  Medication Sig Dispense Refill  . albuterol (PROVENTIL HFA;VENTOLIN HFA) 108 (90 BASE) MCG/ACT inhaler Inhale 2 puffs into the lungs every 6 (six) hours as needed. Shortness of breath 3 Inhaler 2  . amLODipine (NORVASC) 5 MG tablet TAKE 1 TABLET BY MOUTH EVERY DAY 90 tablet 3  . aspirin EC 81 MG tablet Take 81 mg by mouth at bedtime.    Marland Kitchen atorvastatin (LIPITOR) 40 MG tablet Take 1 tablet (40 mg total) by mouth daily. 90 tablet 1  . Blood Glucose Monitoring Suppl (BLOOD GLUCOSE MONITOR SYSTEM) w/Device KIT Dispense one touch per pts insurance. Checks BS 4x daily d/t fluctuating blood glucose. Dx: E11.65 1 each 1  . Cyanocobalamin (VITAMIN B 12 PO) Take 1 tablet by mouth at bedtime.     . diclofenac (VOLTAREN) 75 MG EC tablet TAKE 1 TABLET BY MOUTH TWICE A DAY 30 tablet 0  . Flaxseed, Linseed, (FLAXSEED OIL PO) Take 1 capsule by mouth 2 (two) times daily.     Marland Kitchen gabapentin (NEURONTIN) 100 MG capsule Take 1  capsule (100 mg total) by mouth 3 (three) times daily as needed (nerve pain). 90 capsule 3  . Glucosamine Sulfate-MSM (MSM-GLUCOSAMINE PO) Take 1 capsule by mouth 2 (two) times daily.    . Glucose Blood (BLOOD GLUCOSE TEST STRIPS) STRP Dispense one touch strips. Checks BS 4x daily d/t fluctuating blood glucose. Dx: E11.65 150 each 11  . hydrochlorothiazide (HYDRODIURIL) 25 MG tablet TAKE 1 TABLET BY MOUTH EVERY DAY 90 tablet 3  . Insulin Pen Needle (B-D UF III MINI PEN NEEDLES) 31G X 5 MM MISC 1 each by Does not apply route daily. CHECK BS QID (FOUR TIMES A DAY) Dx e11.9 30 each 11  . Insulin Syringes, Disposable, U-100 1 ML MISC Use BID with insulin 60 each 11  . LANCETS ULTRA FINE MISC Dispense based on patient and insurance preference. Use to monitor FSBS 4x daily d/t fluctuating blood glucose. Dx: E11.65 150 each 0  . meclizine (ANTIVERT) 12.5 MG tablet TAKE 1 TABLET BY MOUTH 3 TIMES DAILY AS NEEDED FOR DIZZINESS. 40 tablet 0  . metFORMIN (GLUCOPHAGE) 1000 MG tablet TAKE 1 TABLET BY MOUTH TWICE A DAY WITH A MEAL 180 tablet 1  . NOVOLIN 70/30 RELION (70-30) 100 UNIT/ML injection INJECT 30 UNITS SUBCUTANEOUSLY IN THE MORNING AND  15 UNITS SUBCUTANEOUSLY IN THE AFTERNOON 20 mL 0  .  pioglitazone (ACTOS) 30 MG tablet TAKE 1 TABLET BY MOUTH EVERY DAY 90 tablet 3   No current facility-administered medications on file prior to visit.    Allergies  Allergen Reactions  . Codeine Itching  . Lisinopril Swelling   Social History   Socioeconomic History  . Marital status: Divorced    Spouse name: Not on file  . Number of children: 2  . Years of education: Not on file  . Highest education level: Not on file  Occupational History  . Not on file  Social Needs  . Financial resource strain: Not on file  . Food insecurity    Worry: Not on file    Inability: Not on file  . Transportation needs    Medical: Not on file    Non-medical: Not on file  Tobacco Use  . Smoking status: Never Smoker  .  Smokeless tobacco: Current User    Types: Snuff  Substance and Sexual Activity  . Alcohol use: No  . Drug use: No  . Sexual activity: Not on file  Lifestyle  . Physical activity    Days per week: Not on file    Minutes per session: Not on file  . Stress: Not on file  Relationships  . Social Herbalist on phone: Not on file    Gets together: Not on file    Attends religious service: Not on file    Active member of club or organization: Not on file    Attends meetings of clubs or organizations: Not on file    Relationship status: Not on file  . Intimate partner violence    Fear of current or ex partner: Not on file    Emotionally abused: Not on file    Physically abused: Not on file    Forced sexual activity: Not on file  Other Topics Concern  . Not on file  Social History Narrative  . Not on file     Review of Systems  All other systems reviewed and are negative.      Objective:   Physical Exam Vitals signs reviewed.  Constitutional:      Appearance: She is obese.  Cardiovascular:     Rate and Rhythm: Normal rate and regular rhythm.     Heart sounds: Normal heart sounds. No murmur. No friction rub. No gallop.   Pulmonary:     Effort: Pulmonary effort is normal. No respiratory distress.     Breath sounds: Normal breath sounds. No stridor. No wheezing, rhonchi or rales.  Chest:     Chest wall: No tenderness.  Musculoskeletal:     Right lower leg: No edema.     Left lower leg: No edema.  Neurological:     Mental Status: She is alert.           Assessment & Plan:  Controlled type 2 diabetes mellitus with complication, with long-term current use of insulin ([place]) - Plan: Hemoglobin A1c, CBC with Differential/Platelet, COMPLETE METABOLIC PANEL WITH GFR, Lipid panel, Microalbumin, urine  Patient's diabetes sound well controlled.  I will check a hemoglobin A1c.  Her last A1c in July was 7.1.  If her hemoglobin A1c is less than 7, the only change I would  make would be to distribute her insulin more evenly throughout the day.  I would recommend switching her 70/30 to 20 units in the morning and 10 units in the afternoon to avoid hypoglycemia and to better manage her sugars later in the  day.  Also check a CBC, CMP, and fasting lipid panel along with a urine microalbumin.  Goal LDL cholesterol is less than 100.  Patient received her flu shot today

## 2019-07-24 LAB — COMPLETE METABOLIC PANEL WITH GFR
AG Ratio: 1.3 (calc) (ref 1.0–2.5)
ALT: 10 U/L (ref 6–29)
AST: 19 U/L (ref 10–35)
Albumin: 4 g/dL (ref 3.6–5.1)
Alkaline phosphatase (APISO): 106 U/L (ref 37–153)
BUN/Creatinine Ratio: 19 (calc) (ref 6–22)
BUN: 23 mg/dL (ref 7–25)
CO2: 24 mmol/L (ref 20–32)
Calcium: 10.1 mg/dL (ref 8.6–10.4)
Chloride: 104 mmol/L (ref 98–110)
Creat: 1.22 mg/dL — ABNORMAL HIGH (ref 0.60–0.88)
GFR, Est African American: 48 mL/min/{1.73_m2} — ABNORMAL LOW (ref >=60)
GFR, Est Non African American: 41 mL/min/{1.73_m2} — ABNORMAL LOW (ref >=60)
Globulin: 3.1 g/dL (calc) (ref 1.9–3.7)
Glucose, Bld: 69 mg/dL (ref 65–99)
Potassium: 3.9 mmol/L (ref 3.5–5.3)
Sodium: 142 mmol/L (ref 135–146)
Total Bilirubin: 1.1 mg/dL (ref 0.2–1.2)
Total Protein: 7.1 g/dL (ref 6.1–8.1)

## 2019-07-24 LAB — CBC WITH DIFFERENTIAL/PLATELET
Absolute Monocytes: 498 cells/uL (ref 200–950)
Basophils Absolute: 39 cells/uL (ref 0–200)
Basophils Relative: 0.7 %
Eosinophils Absolute: 101 cells/uL (ref 15–500)
Eosinophils Relative: 1.8 %
HCT: 38.8 % (ref 35.0–45.0)
Hemoglobin: 12.7 g/dL (ref 11.7–15.5)
Lymphs Abs: 2486 cells/uL (ref 850–3900)
MCH: 29.3 pg (ref 27.0–33.0)
MCHC: 32.7 g/dL (ref 32.0–36.0)
MCV: 89.4 fL (ref 80.0–100.0)
MPV: 10 fL (ref 7.5–12.5)
Monocytes Relative: 8.9 %
Neutro Abs: 2475 cells/uL (ref 1500–7800)
Neutrophils Relative %: 44.2 %
Platelets: 265 10*3/uL (ref 140–400)
RBC: 4.34 10*6/uL (ref 3.80–5.10)
RDW: 13.7 % (ref 11.0–15.0)
Total Lymphocyte: 44.4 %
WBC: 5.6 10*3/uL (ref 3.8–10.8)

## 2019-07-24 LAB — MICROALBUMIN, URINE: Microalb, Ur: 0.4 mg/dL

## 2019-07-24 LAB — HEMOGLOBIN A1C
Hgb A1c MFr Bld: 7.8 % of total Hgb — ABNORMAL HIGH (ref <5.7)
Mean Plasma Glucose: 177 (calc)
eAG (mmol/L): 9.8 (calc)

## 2019-07-24 LAB — LIPID PANEL
Cholesterol: 200 mg/dL — ABNORMAL HIGH (ref <200)
HDL: 73 mg/dL (ref >=50)
LDL Cholesterol (Calc): 107 mg/dL (calc) — ABNORMAL HIGH
Non-HDL Cholesterol (Calc): 127 mg/dL (calc) (ref <130)
Total CHOL/HDL Ratio: 2.7 (calc) (ref <5.0)
Triglycerides: 105 mg/dL (ref <150)

## 2019-11-12 ENCOUNTER — Other Ambulatory Visit: Payer: Self-pay | Admitting: Family Medicine

## 2019-11-30 ENCOUNTER — Other Ambulatory Visit: Payer: Self-pay

## 2019-11-30 ENCOUNTER — Encounter: Payer: Self-pay | Admitting: Family Medicine

## 2019-11-30 ENCOUNTER — Ambulatory Visit (INDEPENDENT_AMBULATORY_CARE_PROVIDER_SITE_OTHER): Payer: Medicare HMO | Admitting: Family Medicine

## 2019-11-30 VITALS — BP 136/78 | HR 80 | Temp 97.4°F | Resp 16 | Ht 65.5 in | Wt 236.0 lb

## 2019-11-30 DIAGNOSIS — I10 Essential (primary) hypertension: Secondary | ICD-10-CM

## 2019-11-30 DIAGNOSIS — E118 Type 2 diabetes mellitus with unspecified complications: Secondary | ICD-10-CM

## 2019-11-30 DIAGNOSIS — Z794 Long term (current) use of insulin: Secondary | ICD-10-CM | POA: Diagnosis not present

## 2019-11-30 DIAGNOSIS — E7800 Pure hypercholesterolemia, unspecified: Secondary | ICD-10-CM | POA: Diagnosis not present

## 2019-11-30 MED ORDER — CLOTRIMAZOLE-BETAMETHASONE 1-0.05 % EX CREA: 1.0000 "application " | TOPICAL_CREAM | Freq: Two times a day (BID) | CUTANEOUS | 0 refills | Status: DC

## 2019-11-30 NOTE — Progress Notes (Signed)
Subjective:    Patient ID: Amanda Riddle, female    DOB: 02-28-1935, 85 y.o.   MRN: [id number]  Medication Refill  Patient is a very pleasant 85 year old African-American female here today for follow-up of her diabetes.  I last saw the patient in October.  At that time her hemoglobin A1c was elevated at 7.8.  At that time she was taking all 30 units of her 70/30 insulin in the morning.  I recommended that she change her administration to 20 in the morning and 10 in the evening and see if this would better manage her blood sugars.  Patient reports that her meter has been malfunctioning.  Whenever she checks her blood sugar she is getting an error message such as 50-F.  She has not changed batteries in her meter ever.  Therefore I recommended that she put new batteries in the meter to see if that is the problem.  If the meter continues to malfunction we will also send in a prescription for a new meter as she definitely needs to check her sugars 2-3 times a day.  Prior to her meter malfunctioning, she states that her blood sugars are around 30.  She denies any episodes of hypoglycemia.  She denies any neuropathy in her feet.  She denies any chest pain shortness of breath or dyspnea on exertion.  She does have a rash in her right axilla.  She also has a rash that is similar in the crease between her pannus and her pelvic area.  The rash looks consistent with Candida intertrigo.  We discussed keeping that area clean and dry but I have also recommended that she use Lotrisone cream twice a day for 2 weeks to alleviate the rash.  Otherwise she is doing well with no concerns. Past Medical History:  Diagnosis Date  . Diabetes mellitus   . Diverticulosis   . Hypercholesteremia   . Hypertension    Past Surgical History:  Procedure Laterality Date  . ABDOMINAL HYSTERECTOMY  1986  . TONSILLECTOMY  1950   Current Outpatient Medications on File Prior to Visit  Medication Sig Dispense Refill  . albuterol  (PROVENTIL HFA;VENTOLIN HFA) 108 (90 BASE) MCG/ACT inhaler Inhale 2 puffs into the lungs every 6 (six) hours as needed. Shortness of breath 3 Inhaler 2  . amLODipine (NORVASC) 5 MG tablet TAKE 1 TABLET BY MOUTH EVERY DAY 90 tablet 3  . aspirin EC 81 MG tablet Take 81 mg by mouth at bedtime.    Marland Kitchen atorvastatin (LIPITOR) 40 MG tablet TAKE 1 TABLET BY MOUTH EVERY DAY 90 tablet 2  . Blood Glucose Monitoring Suppl (BLOOD GLUCOSE MONITOR SYSTEM) w/Device KIT Dispense one touch per pts insurance. Checks BS 4x daily d/t fluctuating blood glucose. Dx: E11.65 1 each 1  . Cyanocobalamin (VITAMIN B 12 PO) Take 1 tablet by mouth at bedtime.     . diclofenac (VOLTAREN) 75 MG EC tablet TAKE 1 TABLET BY MOUTH TWICE A DAY 30 tablet 0  . Flaxseed, Linseed, (FLAXSEED OIL PO) Take 1 capsule by mouth 2 (two) times daily.     . Glucosamine Sulfate-MSM (MSM-GLUCOSAMINE PO) Take 1 capsule by mouth 2 (two) times daily.    . Glucose Blood (BLOOD GLUCOSE TEST STRIPS) STRP Dispense one touch strips. Checks BS 4x daily d/t fluctuating blood glucose. Dx: E11.65 150 each 11  . hydrochlorothiazide (HYDRODIURIL) 25 MG tablet TAKE 1 TABLET BY MOUTH EVERY DAY 90 tablet 3  . Insulin Pen Needle (B-D UF III MINI  PEN NEEDLES) 31G X 5 MM MISC 1 each by Does not apply route daily. CHECK BS QID (FOUR TIMES A DAY) Dx e11.9 30 each 11  . Insulin Syringes, Disposable, U-100 1 ML MISC Use BID with insulin 60 each 11  . LANCETS ULTRA FINE MISC Dispense based on patient and insurance preference. Use to monitor FSBS 4x daily d/t fluctuating blood glucose. Dx: E11.65 150 each 0  . meclizine (ANTIVERT) 12.5 MG tablet TAKE 1 TABLET BY MOUTH 3 TIMES DAILY AS NEEDED FOR DIZZINESS. 40 tablet 0  . metFORMIN (GLUCOPHAGE) 1000 MG tablet TAKE 1 TABLET BY MOUTH TWICE A DAY WITH A MEAL 180 tablet 0  . NOVOLIN 70/30 RELION (70-30) 100 UNIT/ML injection INJECT 30 UNITS SUBCUTANEOUSLY IN THE MORNING AND  15 UNITS SUBCUTANEOUSLY IN THE AFTERNOON (Patient taking  differently: 20 qam and 10qpm) 20 mL 0  . pioglitazone (ACTOS) 30 MG tablet TAKE 1 TABLET BY MOUTH EVERY DAY 90 tablet 3  . gabapentin (NEURONTIN) 100 MG capsule Take 1 capsule (100 mg total) by mouth 3 (three) times daily as needed (nerve pain). (Patient not taking: Reported on 11/30/2019) 90 capsule 3   No current facility-administered medications on file prior to visit.   Allergies  Allergen Reactions  . Codeine Itching  . Lisinopril Swelling   Social History   Socioeconomic History  . Marital status: Divorced    Spouse name: Not on file  . Number of children: 2  . Years of education: Not on file  . Highest education level: Not on file  Occupational History  . Not on file  Tobacco Use  . Smoking status: Never Smoker  . Smokeless tobacco: Current User    Types: Snuff  Substance and Sexual Activity  . Alcohol use: No  . Drug use: No  . Sexual activity: Not on file  Other Topics Concern  . Not on file  Social History Narrative  . Not on file   Social Determinants of Health   Financial Resource Strain:   . Difficulty of Paying Living Expenses: Not on file  Food Insecurity:   . Worried About Charity fundraiser in the Last Year: Not on file  . Ran Out of Food in the Last Year: Not on file  Transportation Needs:   . Lack of Transportation (Medical): Not on file  . Lack of Transportation (Non-Medical): Not on file  Physical Activity:   . Days of Exercise per Week: Not on file  . Minutes of Exercise per Session: Not on file  Stress:   . Feeling of Stress : Not on file  Social Connections:   . Frequency of Communication with Friends and Family: Not on file  . Frequency of Social Gatherings with Friends and Family: Not on file  . Attends Religious Services: Not on file  . Active Member of Clubs or Organizations: Not on file  . Attends Archivist Meetings: Not on file  . Marital Status: Not on file  Intimate Partner Violence:   . Fear of Current or Ex-Partner:  Not on file  . Emotionally Abused: Not on file  . Physically Abused: Not on file  . Sexually Abused: Not on file     Review of Systems  All other systems reviewed and are negative.      Objective:   Physical Exam Vitals reviewed.  Constitutional:      Appearance: She is obese.  Cardiovascular:     Rate and Rhythm: Normal rate and regular rhythm.  Heart sounds: Normal heart sounds. No murmur. No friction rub. No gallop.   Pulmonary:     Effort: Pulmonary effort is normal. No respiratory distress.     Breath sounds: Normal breath sounds. No stridor. No wheezing, rhonchi or rales.  Chest:     Chest wall: No tenderness.  Musculoskeletal:     Right lower leg: No edema.     Left lower leg: No edema.  Neurological:     Mental Status: She is alert.           Assessment & Plan:  Controlled type 2 diabetes mellitus with complication, with long-term current use of insulin (HCC) - Plan: CBC with Differential/Platelet, COMPLETE METABOLIC PANEL WITH GFR, Lipid panel, Hemoglobin A1c  Essential hypertension  Pure hypercholesterolemia  Blood pressure today is outstanding.  Patient's review of systems is negative.  Diabetic foot exam is normal.  We will treat the Candida intertrigo in her right axilla and on her pannus with Lotrisone cream twice daily for 2 weeks.  I will check a hemoglobin A1c however her blood sugar sounds well controlled.  Ideally I would like her hemoglobin A1c to be less than 7.  I will also check a fasting lipid panel.  Her goal LDL cholesterol is less than 100.  Strongly recommended the patient get the COVID-19 vaccine.  Discussed the risk and benefits of the vaccination and how the benefits far exceed any risk.  Also gave the patient the contact information to help her schedule herself for the vaccine.

## 2019-12-01 LAB — COMPLETE METABOLIC PANEL WITH GFR
AG Ratio: 1.4 (calc) (ref 1.0–2.5)
ALT: 11 U/L (ref 6–29)
AST: 14 U/L (ref 10–35)
Albumin: 4 g/dL (ref 3.6–5.1)
Alkaline phosphatase (APISO): 106 U/L (ref 37–153)
BUN/Creatinine Ratio: 15 (calc) (ref 6–22)
BUN: 16 mg/dL (ref 7–25)
CO2: 25 mmol/L (ref 20–32)
Calcium: 9.5 mg/dL (ref 8.6–10.4)
Chloride: 100 mmol/L (ref 98–110)
Creat: 1.07 mg/dL — ABNORMAL HIGH (ref 0.60–0.88)
GFR, Est African American: 56 mL/min/{1.73_m2} — ABNORMAL LOW (ref >=60)
GFR, Est Non African American: 48 mL/min/{1.73_m2} — ABNORMAL LOW (ref >=60)
Globulin: 2.9 g/dL (calc) (ref 1.9–3.7)
Glucose, Bld: 238 mg/dL — ABNORMAL HIGH (ref 65–99)
Potassium: 4.1 mmol/L (ref 3.5–5.3)
Sodium: 137 mmol/L (ref 135–146)
Total Bilirubin: 1.1 mg/dL (ref 0.2–1.2)
Total Protein: 6.9 g/dL (ref 6.1–8.1)

## 2019-12-01 LAB — CBC WITH DIFFERENTIAL/PLATELET
Absolute Monocytes: 464 cells/uL (ref 200–950)
Basophils Absolute: 29 cells/uL (ref 0–200)
Basophils Relative: 0.5 %
Eosinophils Absolute: 41 cells/uL (ref 15–500)
Eosinophils Relative: 0.7 %
HCT: 38.9 % (ref 35.0–45.0)
Hemoglobin: 12.7 g/dL (ref 11.7–15.5)
Lymphs Abs: 2569 cells/uL (ref 850–3900)
MCH: 29.3 pg (ref 27.0–33.0)
MCHC: 32.6 g/dL (ref 32.0–36.0)
MCV: 89.6 fL (ref 80.0–100.0)
MPV: 10.2 fL (ref 7.5–12.5)
Monocytes Relative: 8 %
Neutro Abs: 2697 cells/uL (ref 1500–7800)
Neutrophils Relative %: 46.5 %
Platelets: 255 10*3/uL (ref 140–400)
RBC: 4.34 10*6/uL (ref 3.80–5.10)
RDW: 13 % (ref 11.0–15.0)
Total Lymphocyte: 44.3 %
WBC: 5.8 10*3/uL (ref 3.8–10.8)

## 2019-12-01 LAB — LIPID PANEL
Cholesterol: 213 mg/dL — ABNORMAL HIGH (ref <200)
HDL: 80 mg/dL (ref >=50)
LDL Cholesterol (Calc): 107 mg/dL (calc) — ABNORMAL HIGH
Non-HDL Cholesterol (Calc): 133 mg/dL (calc) — ABNORMAL HIGH (ref <130)
Total CHOL/HDL Ratio: 2.7 (calc) (ref <5.0)
Triglycerides: 143 mg/dL (ref <150)

## 2019-12-01 LAB — HEMOGLOBIN A1C
Hgb A1c MFr Bld: 9.9 % of total Hgb — ABNORMAL HIGH (ref <5.7)
Mean Plasma Glucose: 237 (calc)
eAG (mmol/L): 13.2 (calc)

## 2019-12-02 ENCOUNTER — Other Ambulatory Visit: Payer: Self-pay | Admitting: Family Medicine

## 2019-12-02 MED ORDER — ACCU-CHEK FASTCLIX LANCET KIT: PACK | 1 refills | Status: DC

## 2019-12-02 MED ORDER — ACCU-CHEK AVIVA PLUS VI STRP: ORAL_STRIP | 3 refills | Status: DC

## 2019-12-02 MED ORDER — ACCU-CHEK AVIVA PLUS W/DEVICE KIT: PACK | 1 refills | Status: DC

## 2019-12-02 MED ORDER — ACCU-CHEK FASTCLIX LANCETS MISC: 3 refills | Status: DC

## 2019-12-04 DIAGNOSIS — R69 Illness, unspecified: Secondary | ICD-10-CM | POA: Diagnosis not present

## 2019-12-13 ENCOUNTER — Other Ambulatory Visit: Payer: Self-pay

## 2019-12-13 ENCOUNTER — Encounter: Payer: Self-pay | Admitting: Family Medicine

## 2019-12-13 ENCOUNTER — Ambulatory Visit (INDEPENDENT_AMBULATORY_CARE_PROVIDER_SITE_OTHER): Payer: Medicare HMO | Admitting: Family Medicine

## 2019-12-13 VITALS — BP 140/70 | HR 74 | Temp 97.2°F | Resp 14 | Ht 65.5 in | Wt 236.0 lb

## 2019-12-13 DIAGNOSIS — R739 Hyperglycemia, unspecified: Secondary | ICD-10-CM

## 2019-12-13 MED ORDER — MELOXICAM 15 MG PO TABS: 15.0000 mg | ORAL_TABLET | Freq: Every day | ORAL | 0 refills | Status: DC

## 2019-12-13 NOTE — Progress Notes (Signed)
Subjective:    Patient ID: Amanda Riddle, female    DOB: 1935/07/08, 85 y.o.   MRN: [id number]  Last HgA1c was 9.9 which was up from 7.8.  Patient is reportedly taking 70/30 insulin 20 units in the morning and 10 units in afternoon.  She presents today with fasting blood sugars only.  Her fasting blood sugars over the last week or 130, 133, 160, and 135.  She is not checking any 2-hour postprandial sugars.  Therefore, I suspect that her postprandial sugars are the cause of her elevated hemoglobin A1c. Past Medical History:  Diagnosis Date  . Diabetes mellitus   . Diverticulosis   . Hypercholesteremia   . Hypertension    Past Surgical History:  Procedure Laterality Date  . ABDOMINAL HYSTERECTOMY  1986  . TONSILLECTOMY  1950   Current Outpatient Medications on File Prior to Visit  Medication Sig Dispense Refill  . Accu-Chek FastClix Lancets MISC Checks BS 4x daily d/t fluctuating blood glucose. Dx: E11.65 200 each 3  . albuterol (PROVENTIL HFA;VENTOLIN HFA) 108 (90 BASE) MCG/ACT inhaler Inhale 2 puffs into the lungs every 6 (six) hours as needed. Shortness of breath 3 Inhaler 2  . amLODipine (NORVASC) 5 MG tablet TAKE 1 TABLET BY MOUTH EVERY DAY 90 tablet 3  . aspirin EC 81 MG tablet Take 81 mg by mouth at bedtime.    Marland Kitchen atorvastatin (LIPITOR) 40 MG tablet TAKE 1 TABLET BY MOUTH EVERY DAY 90 tablet 2  . Blood Glucose Monitoring Suppl (ACCU-CHEK AVIVA PLUS) w/Device KIT Checks BS 4x daily d/t fluctuating blood glucose. Dx: E11.65 1 kit 1  . clotrimazole-betamethasone (LOTRISONE) cream Apply 1 application topically 2 (two) times daily. 30 g 0  . Cyanocobalamin (VITAMIN B 12 PO) Take 1 tablet by mouth at bedtime.     . diclofenac (VOLTAREN) 75 MG EC tablet TAKE 1 TABLET BY MOUTH TWICE A DAY 30 tablet 0  . Flaxseed, Linseed, (FLAXSEED OIL PO) Take 1 capsule by mouth 2 (two) times daily.     . Glucosamine Sulfate-MSM (MSM-GLUCOSAMINE PO) Take 1 capsule by mouth 2 (two) times daily.    Marland Kitchen  glucose blood (ACCU-CHEK AVIVA PLUS) test strip Checks BS 4x daily d/t fluctuating blood glucose. Dx: E11.65 200 each 3  . hydrochlorothiazide (HYDRODIURIL) 25 MG tablet TAKE 1 TABLET BY MOUTH EVERY DAY 90 tablet 3  . Insulin Pen Needle (B-D UF III MINI PEN NEEDLES) 31G X 5 MM MISC 1 each by Does not apply route daily. CHECK BS QID (FOUR TIMES A DAY) Dx e11.9 30 each 11  . Insulin Syringes, Disposable, U-100 1 ML MISC Use BID with insulin 60 each 11  . Lancets Misc. (ACCU-CHEK FASTCLIX LANCET) KIT Checks BS 4x daily d/t fluctuating blood glucose. Dx: E11.65 1 kit 1  . meclizine (ANTIVERT) 12.5 MG tablet TAKE 1 TABLET BY MOUTH 3 TIMES DAILY AS NEEDED FOR DIZZINESS. 40 tablet 0  . metFORMIN (GLUCOPHAGE) 1000 MG tablet TAKE 1 TABLET BY MOUTH TWICE A DAY WITH A MEAL 180 tablet 0  . NOVOLIN 70/30 RELION (70-30) 100 UNIT/ML injection INJECT 30 UNITS SUBCUTANEOUSLY IN THE MORNING AND  15 UNITS SUBCUTANEOUSLY IN THE AFTERNOON (Patient taking differently: 20 qam and 10qpm) 20 mL 0  . pioglitazone (ACTOS) 30 MG tablet TAKE 1 TABLET BY MOUTH EVERY DAY 90 tablet 3   No current facility-administered medications on file prior to visit.   Allergies  Allergen Reactions  . Codeine Itching  . Lisinopril Swelling  Social History   Socioeconomic History  . Marital status: Divorced    Spouse name: Not on file  . Number of children: 2  . Years of education: Not on file  . Highest education level: Not on file  Occupational History  . Not on file  Tobacco Use  . Smoking status: Never Smoker  . Smokeless tobacco: Current User    Types: Snuff  Substance and Sexual Activity  . Alcohol use: No  . Drug use: No  . Sexual activity: Not on file  Other Topics Concern  . Not on file  Social History Narrative  . Not on file   Social Determinants of Health   Financial Resource Strain:   . Difficulty of Paying Living Expenses: Not on file  Food Insecurity:   . Worried About Charity fundraiser in the Last  Year: Not on file  . Ran Out of Food in the Last Year: Not on file  Transportation Needs:   . Lack of Transportation (Medical): Not on file  . Lack of Transportation (Non-Medical): Not on file  Physical Activity:   . Days of Exercise per Week: Not on file  . Minutes of Exercise per Session: Not on file  Stress:   . Feeling of Stress : Not on file  Social Connections:   . Frequency of Communication with Friends and Family: Not on file  . Frequency of Social Gatherings with Friends and Family: Not on file  . Attends Religious Services: Not on file  . Active Member of Clubs or Organizations: Not on file  . Attends Archivist Meetings: Not on file  . Marital Status: Not on file  Intimate Partner Violence:   . Fear of Current or Ex-Partner: Not on file  . Emotionally Abused: Not on file  . Physically Abused: Not on file  . Sexually Abused: Not on file     Review of Systems  All other systems reviewed and are negative.      Objective:   Physical Exam Vitals reviewed.  Constitutional:      Appearance: She is obese.  Cardiovascular:     Rate and Rhythm: Normal rate and regular rhythm.     Heart sounds: Normal heart sounds. No murmur. No friction rub. No gallop.   Pulmonary:     Effort: Pulmonary effort is normal. No respiratory distress.     Breath sounds: Normal breath sounds. No stridor. No wheezing, rhonchi or rales.  Chest:     Chest wall: No tenderness.  Musculoskeletal:     Left shoulder: Tenderness present. Decreased range of motion.     Right lower leg: No edema.     Left lower leg: No edema.  Neurological:     Mental Status: She is alert.           Assessment & Plan:  Hyperglycemia  Blood sugars are poorly controlled.  Given the fact her fasting blood sugars are excellent, I suspect that her 2-hour postprandial sugars are the cause of her elevated hemoglobin A1c.  Therefore I will increase her 70/30 to 30 units in the morning and continue 10 units  in the afternoon.  Recheck 2-hour postprandial sugars and fasting blood sugars in 1 week or sooner if problems arise.  She also has some pain in her left shoulder with abduction greater than 90 degrees.  She has crepitus.  She does have a history of stage III chronic kidney disease but I do believe that she could take a  low-dose NSAID for 1 week without any issues.  Therefore I gave the patient meloxicam 15 mg a day for 1 week and then reassess.  Follow-up again in 1 week.

## 2019-12-31 ENCOUNTER — Ambulatory Visit (INDEPENDENT_AMBULATORY_CARE_PROVIDER_SITE_OTHER): Payer: Medicare HMO | Admitting: Family Medicine

## 2019-12-31 ENCOUNTER — Other Ambulatory Visit: Payer: Self-pay

## 2019-12-31 VITALS — BP 122/66 | HR 76 | Temp 97.6°F | Resp 18 | Ht 65.5 in | Wt 238.0 lb

## 2019-12-31 DIAGNOSIS — M7552 Bursitis of left shoulder: Secondary | ICD-10-CM

## 2019-12-31 DIAGNOSIS — E118 Type 2 diabetes mellitus with unspecified complications: Secondary | ICD-10-CM | POA: Diagnosis not present

## 2019-12-31 DIAGNOSIS — Z794 Long term (current) use of insulin: Secondary | ICD-10-CM | POA: Diagnosis not present

## 2019-12-31 NOTE — Progress Notes (Signed)
Subjective:    Patient ID: Amanda Riddle, female    DOB: [birth year], 85 y.o.   MRN: [id number] 12/12/19 Last HgA1c was 9.9 which was up from 7.8.  Patient is reportedly taking 70/30 insulin 20 units in the morning and 10 units in afternoon.  She presents today with fasting blood sugars only.  Her fasting blood sugars over the last week or 130, 133, 160, and 135.  She is not checking any 2-hour postprandial sugars.  Therefore, I suspect that her postprandial sugars are the cause of her elevated hemoglobin A1c.  At that time, my plan was: Blood sugars are poorly controlled.  Given the fact her fasting blood sugars are excellent, I suspect that her 2-hour postprandial sugars are the cause of her elevated hemoglobin A1c.  Therefore I will increase her 70/30 to 30 units in the morning and continue 10 units in the afternoon.  Recheck 2-hour postprandial sugars and fasting blood sugars in 1 week or sooner if problems arise.  She also has some pain in her left shoulder with abduction greater than 90 degrees.  She has crepitus.  She does have a history of stage III chronic kidney disease but I do believe that she could take a low-dose NSAID for 1 week without any issues.  Therefore I gave the patient meloxicam 15 mg a day for 1 week and then reassess.  Follow-up again in 1 week.  12/31/19 Patient's blood sugar looks really good.  Her fasting blood sugars in the morning vary between 120 and 140 but the vast majority are around 135.  Her 2-hour postprandial sugars in the afternoon vary between 59 and 211 but the vast majority are around 157-160.  She seems to be tolerating 30 units in the morning and 10 units in the evening without any difficulty.  Unfortunately she continues to have the pain in her left shoulder and is requesting a cortisone injection today. Past Medical History:  Diagnosis Date  . Diabetes mellitus   . Diverticulosis   . Hypercholesteremia   . Hypertension    Past Surgical History:    Procedure Laterality Date  . ABDOMINAL HYSTERECTOMY  1986  . TONSILLECTOMY  1950   Current Outpatient Medications on File Prior to Visit  Medication Sig Dispense Refill  . Accu-Chek FastClix Lancets MISC Checks BS 4x daily d/t fluctuating blood glucose. Dx: E11.65 200 each 3  . albuterol (PROVENTIL HFA;VENTOLIN HFA) 108 (90 BASE) MCG/ACT inhaler Inhale 2 puffs into the lungs every 6 (six) hours as needed. Shortness of breath 3 Inhaler 2  . amLODipine (NORVASC) 5 MG tablet TAKE 1 TABLET BY MOUTH EVERY DAY 90 tablet 3  . aspirin EC 81 MG tablet Take 81 mg by mouth at bedtime.    Marland Kitchen atorvastatin (LIPITOR) 40 MG tablet TAKE 1 TABLET BY MOUTH EVERY DAY 90 tablet 2  . Blood Glucose Monitoring Suppl (ACCU-CHEK AVIVA PLUS) w/Device KIT Checks BS 4x daily d/t fluctuating blood glucose. Dx: E11.65 1 kit 1  . clotrimazole-betamethasone (LOTRISONE) cream Apply 1 application topically 2 (two) times daily. 30 g 0  . Cyanocobalamin (VITAMIN B 12 PO) Take 1 tablet by mouth at bedtime.     . diclofenac (VOLTAREN) 75 MG EC tablet TAKE 1 TABLET BY MOUTH TWICE A DAY 30 tablet 0  . Flaxseed, Linseed, (FLAXSEED OIL PO) Take 1 capsule by mouth 2 (two) times daily.     . Glucosamine Sulfate-MSM (MSM-GLUCOSAMINE PO) Take 1 capsule by mouth 2 (two) times daily.    Marland Kitchen  glucose blood (ACCU-CHEK AVIVA PLUS) test strip Checks BS 4x daily d/t fluctuating blood glucose. Dx: E11.65 200 each 3  . hydrochlorothiazide (HYDRODIURIL) 25 MG tablet TAKE 1 TABLET BY MOUTH EVERY DAY 90 tablet 3  . Insulin Pen Needle (B-D UF III MINI PEN NEEDLES) 31G X 5 MM MISC 1 each by Does not apply route daily. CHECK BS QID (FOUR TIMES A DAY) Dx e11.9 30 each 11  . Insulin Syringes, Disposable, U-100 1 ML MISC Use BID with insulin 60 each 11  . Lancets Misc. (ACCU-CHEK FASTCLIX LANCET) KIT Checks BS 4x daily d/t fluctuating blood glucose. Dx: E11.65 1 kit 1  . meclizine (ANTIVERT) 12.5 MG tablet TAKE 1 TABLET BY MOUTH 3 TIMES DAILY AS NEEDED FOR  DIZZINESS. 40 tablet 0  . meloxicam (MOBIC) 15 MG tablet Take 1 tablet (15 mg total) by mouth daily. Stop after 10 days due to kidneys 30 tablet 0  . metFORMIN (GLUCOPHAGE) 1000 MG tablet TAKE 1 TABLET BY MOUTH TWICE A DAY WITH A MEAL 180 tablet 0  . NOVOLIN 70/30 RELION (70-30) 100 UNIT/ML injection INJECT 30 UNITS SUBCUTANEOUSLY IN THE MORNING AND  15 UNITS SUBCUTANEOUSLY IN THE AFTERNOON (Patient taking differently: 20 qam and 10qpm) 20 mL 0  . pioglitazone (ACTOS) 30 MG tablet TAKE 1 TABLET BY MOUTH EVERY DAY 90 tablet 3   No current facility-administered medications on file prior to visit.   Allergies  Allergen Reactions  . Codeine Itching  . Lisinopril Swelling   Social History   Socioeconomic History  . Marital status: Divorced    Spouse name: Not on file  . Number of children: 2  . Years of education: Not on file  . Highest education level: Not on file  Occupational History  . Not on file  Tobacco Use  . Smoking status: Never Smoker  . Smokeless tobacco: Current User    Types: Snuff  Substance and Sexual Activity  . Alcohol use: No  . Drug use: No  . Sexual activity: Not on file  Other Topics Concern  . Not on file  Social History Narrative  . Not on file   Social Determinants of Health   Financial Resource Strain:   . Difficulty of Paying Living Expenses:   Food Insecurity:   . Worried About Charity fundraiser in the Last Year:   . Arboriculturist in the Last Year:   Transportation Needs:   . Film/video editor (Medical):   Marland Kitchen Lack of Transportation (Non-Medical):   Physical Activity:   . Days of Exercise per Week:   . Minutes of Exercise per Session:   Stress:   . Feeling of Stress :   Social Connections:   . Frequency of Communication with Friends and Family:   . Frequency of Social Gatherings with Friends and Family:   . Attends Religious Services:   . Active Member of Clubs or Organizations:   . Attends Archivist Meetings:   Marland Kitchen  Marital Status:   Intimate Partner Violence:   . Fear of Current or Ex-Partner:   . Emotionally Abused:   Marland Kitchen Physically Abused:   . Sexually Abused:      Review of Systems  All other systems reviewed and are negative.      Objective:   Physical Exam Vitals reviewed.  Constitutional:      Appearance: She is obese.  Cardiovascular:     Rate and Rhythm: Normal rate and regular rhythm.  Heart sounds: Normal heart sounds. No murmur. No friction rub. No gallop.   Pulmonary:     Effort: Pulmonary effort is normal. No respiratory distress.     Breath sounds: Normal breath sounds. No stridor. No wheezing, rhonchi or rales.  Chest:     Chest wall: No tenderness.  Musculoskeletal:     Left shoulder: Tenderness present. Decreased range of motion.     Right lower leg: No edema.     Left lower leg: No edema.  Neurological:     Mental Status: She is alert.           Assessment & Plan:  Controlled type 2 diabetes mellitus with complication, with long-term current use of insulin (HCC)  Subacromial bursitis of left shoulder joint  We will make no changes in her insulin dose as her reported blood sugars look well controlled.  We discussed options including pain medication to help manage the pain in her left shoulder but ultimately the patient elects to receive a cortisone injection.  Using sterile technique, I injected the left shoulder with 2 cc lidocaine, 2 cc of Marcaine, and 2 cc of 40 mg/mL Kenalog.  Patient tolerated the procedure well without any complications.

## 2020-01-30 ENCOUNTER — Other Ambulatory Visit: Payer: Self-pay | Admitting: Family Medicine

## 2020-02-14 ENCOUNTER — Other Ambulatory Visit: Payer: Self-pay | Admitting: Family Medicine

## 2020-02-14 DIAGNOSIS — I10 Essential (primary) hypertension: Secondary | ICD-10-CM

## 2020-03-02 DIAGNOSIS — R69 Illness, unspecified: Secondary | ICD-10-CM | POA: Diagnosis not present

## 2020-04-26 DIAGNOSIS — H5200 Hypermetropia, unspecified eye: Secondary | ICD-10-CM | POA: Diagnosis not present

## 2020-05-10 ENCOUNTER — Other Ambulatory Visit: Payer: Self-pay | Admitting: Family Medicine

## 2020-05-17 ENCOUNTER — Other Ambulatory Visit: Payer: Self-pay | Admitting: Family Medicine

## 2020-05-18 ENCOUNTER — Other Ambulatory Visit: Payer: Self-pay | Admitting: Family Medicine

## 2020-05-18 DIAGNOSIS — Z1231 Encounter for screening mammogram for malignant neoplasm of breast: Secondary | ICD-10-CM

## 2020-06-06 ENCOUNTER — Other Ambulatory Visit: Payer: Self-pay

## 2020-06-06 ENCOUNTER — Ambulatory Visit
Admission: RE | Admit: 2020-06-06 | Discharge: 2020-06-06 | Disposition: A | Discharge status: Home / Self Care | Payer: Medicare HMO | Source: Ambulatory Visit | Attending: Family Medicine | Admitting: Family Medicine

## 2020-06-06 DIAGNOSIS — Z1231 Encounter for screening mammogram for malignant neoplasm of breast: Secondary | ICD-10-CM

## 2020-07-19 ENCOUNTER — Other Ambulatory Visit: Payer: Self-pay

## 2020-07-19 ENCOUNTER — Ambulatory Visit
Admission: RE | Admit: 2020-07-19 | Discharge: 2020-07-19 | Disposition: A | Discharge status: Home / Self Care | Payer: Medicare HMO | Source: Ambulatory Visit | Attending: Family Medicine | Admitting: Family Medicine

## 2020-07-19 DIAGNOSIS — Z1231 Encounter for screening mammogram for malignant neoplasm of breast: Secondary | ICD-10-CM | POA: Diagnosis not present

## 2020-08-17 ENCOUNTER — Ambulatory Visit (INDEPENDENT_AMBULATORY_CARE_PROVIDER_SITE_OTHER): Payer: Medicare HMO | Admitting: Family Medicine

## 2020-08-17 ENCOUNTER — Other Ambulatory Visit: Payer: Self-pay

## 2020-08-17 ENCOUNTER — Encounter: Payer: Self-pay | Admitting: Family Medicine

## 2020-08-17 VITALS — BP 120/60 | HR 78 | Temp 98.2°F | Resp 18 | Ht 65.5 in | Wt 236.0 lb

## 2020-08-17 DIAGNOSIS — E118 Type 2 diabetes mellitus with unspecified complications: Secondary | ICD-10-CM | POA: Diagnosis not present

## 2020-08-17 DIAGNOSIS — I10 Essential (primary) hypertension: Secondary | ICD-10-CM

## 2020-08-17 DIAGNOSIS — E7800 Pure hypercholesterolemia, unspecified: Secondary | ICD-10-CM | POA: Diagnosis not present

## 2020-08-17 DIAGNOSIS — Z23 Encounter for immunization: Secondary | ICD-10-CM | POA: Diagnosis not present

## 2020-08-17 DIAGNOSIS — Z794 Long term (current) use of insulin: Secondary | ICD-10-CM | POA: Diagnosis not present

## 2020-08-17 MED ORDER — ATORVASTATIN CALCIUM 40 MG PO TABS: 40.0000 mg | ORAL_TABLET | Freq: Every day | ORAL | 2 refills | Status: DC

## 2020-08-17 MED ORDER — TRIAMCINOLONE ACETONIDE 0.1 % EX CREA
1.0000 | TOPICAL_CREAM | Freq: Two times a day (BID) | CUTANEOUS | 0 refills | Status: DC
Start: 2020-08-17 — End: 2022-10-03

## 2020-08-17 NOTE — Progress Notes (Signed)
Subjective:    Patient ID: Amanda Riddle, female    DOB: April 04, 1935, 85 y.o.   MRN: [id number] Patient is a very pleasant 85 year old African-American female who presents today for her diabetes checkup.  The last A1c I have on record was in February and was 9.9!.  Patient is taking insulin 35 units in the morning.  She is supposed to be on 30 units in the morning and 15 units at night.  She admits that she is only been taking 30 5 in the morning.  She also states that she has not been checking her blood sugars.  She states that it hurts her fingers so she just has not been checking it.  She denies any hypoglycemic episodes however she states that she feels weak at times.  She denies any polyuria, polydipsia, or blurry vision.  She denies any chest pain or shortness of breath or dyspnea on exertion.  Blood pressure today is 120/60.  She does have a rash on her left leg.  The rash is all distal to the knee.  They are erythematous whelps.  Each is approximately 6 to 10 mm in diameter.  They are usually clustered in a group of 2 or 3.  There is approximately 6 or 7 going up the anterior portion of her left leg.  She states that they itch but they are going away.  There are none located above the knee.  There are no vesicles.  She denies any neuropathic pain in her leg. Past Medical History:  Diagnosis Date  . Diabetes mellitus   . Diverticulosis   . Hypercholesteremia   . Hypertension    Past Surgical History:  Procedure Laterality Date  . ABDOMINAL HYSTERECTOMY  1986  . TONSILLECTOMY  1950   Current Outpatient Medications on File Prior to Visit  Medication Sig Dispense Refill  . Accu-Chek FastClix Lancets MISC Checks BS 4x daily d/t fluctuating blood glucose. Dx: E11.65 200 each 3  . albuterol (PROVENTIL HFA;VENTOLIN HFA) 108 (90 BASE) MCG/ACT inhaler Inhale 2 puffs into the lungs every 6 (six) hours as needed. Shortness of breath 3 Inhaler 2  . amLODipine (NORVASC) 5 MG tablet TAKE 1 TABLET BY  MOUTH EVERY DAY 90 tablet 3  . aspirin EC 81 MG tablet Take 81 mg by mouth at bedtime.    Marland Kitchen atorvastatin (LIPITOR) 40 MG tablet TAKE 1 TABLET BY MOUTH EVERY DAY 90 tablet 2  . Blood Glucose Monitoring Suppl (ACCU-CHEK AVIVA PLUS) w/Device KIT Checks BS 4x daily d/t fluctuating blood glucose. Dx: E11.65 1 kit 1  . clotrimazole-betamethasone (LOTRISONE) cream Apply 1 application topically 2 (two) times daily. 30 g 0  . Cyanocobalamin (VITAMIN B 12 PO) Take 1 tablet by mouth at bedtime.     . Flaxseed, Linseed, (FLAXSEED OIL PO) Take 1 capsule by mouth 2 (two) times daily.     . Glucosamine Sulfate-MSM (MSM-GLUCOSAMINE PO) Take 1 capsule by mouth 2 (two) times daily.    Marland Kitchen glucose blood (ACCU-CHEK AVIVA PLUS) test strip Checks BS 4x daily d/t fluctuating blood glucose. Dx: E11.65 200 each 3  . hydrochlorothiazide (HYDRODIURIL) 25 MG tablet TAKE 1 TABLET BY MOUTH EVERY DAY 90 tablet 3  . Insulin Pen Needle (B-D UF III MINI PEN NEEDLES) 31G X 5 MM MISC 1 each by Does not apply route daily. CHECK BS QID (FOUR TIMES A DAY) Dx e11.9 30 each 11  . Insulin Syringes, Disposable, U-100 1 ML MISC Use BID with insulin 60 each  11  . Lancets Misc. (ACCU-CHEK FASTCLIX LANCET) KIT Checks BS 4x daily d/t fluctuating blood glucose. Dx: E11.65 1 kit 1  . meclizine (ANTIVERT) 12.5 MG tablet TAKE 1 TABLET BY MOUTH 3 TIMES DAILY AS NEEDED FOR DIZZINESS. 40 tablet 0  . metFORMIN (GLUCOPHAGE) 1000 MG tablet TAKE 1 TABLET BY MOUTH TWICE A DAY WITH A MEAL 180 tablet 1  . NOVOLIN 70/30 RELION (70-30) 100 UNIT/ML injection INJECT 30  UNITS SUBCUTANEOUSLY IN THE MORNING AND 15 UNITS SUBCUTANEOUSLY IN THE AFTERNOON 20 mL 3  . pioglitazone (ACTOS) 30 MG tablet TAKE 1 TABLET BY MOUTH EVERY DAY 90 tablet 3   No current facility-administered medications on file prior to visit.   Allergies  Allergen Reactions  . Codeine Itching  . Lisinopril Swelling   Social History   Socioeconomic History  . Marital status: Divorced     Spouse name: Not on file  . Number of children: 2  . Years of education: Not on file  . Highest education level: Not on file  Occupational History  . Not on file  Tobacco Use  . Smoking status: Never Smoker  . Smokeless tobacco: Current User    Types: Snuff  Substance and Sexual Activity  . Alcohol use: No  . Drug use: No  . Sexual activity: Not on file  Other Topics Concern  . Not on file  Social History Narrative  . Not on file   Social Determinants of Health   Financial Resource Strain:   . Difficulty of Paying Living Expenses: Not on file  Food Insecurity:   . Worried About Charity fundraiser in the Last Year: Not on file  . Ran Out of Food in the Last Year: Not on file  Transportation Needs:   . Lack of Transportation (Medical): Not on file  . Lack of Transportation (Non-Medical): Not on file  Physical Activity:   . Days of Exercise per Week: Not on file  . Minutes of Exercise per Session: Not on file  Stress:   . Feeling of Stress : Not on file  Social Connections:   . Frequency of Communication with Friends and Family: Not on file  . Frequency of Social Gatherings with Friends and Family: Not on file  . Attends Religious Services: Not on file  . Active Member of Clubs or Organizations: Not on file  . Attends Archivist Meetings: Not on file  . Marital Status: Not on file  Intimate Partner Violence:   . Fear of Current or Ex-Partner: Not on file  . Emotionally Abused: Not on file  . Physically Abused: Not on file  . Sexually Abused: Not on file     Review of Systems  All other systems reviewed and are negative.      Objective:   Physical Exam Vitals reviewed.  Constitutional:      Appearance: She is obese.  Cardiovascular:     Rate and Rhythm: Normal rate and regular rhythm.     Heart sounds: Normal heart sounds. No murmur heard.  No friction rub. No gallop.   Pulmonary:     Effort: Pulmonary effort is normal. No respiratory distress.      Breath sounds: Normal breath sounds. No stridor. No wheezing, rhonchi or rales.  Chest:     Chest wall: No tenderness.  Musculoskeletal:     Right lower leg: No edema.     Left lower leg: No edema.       Legs:  Skin:  Findings: Rash present. Rash is papular.  Neurological:     Mental Status: She is alert.           Assessment & Plan:  Controlled type 2 diabetes mellitus with complication, with long-term current use of insulin ([place]) - Plan: CBC with Differential/Platelet, COMPLETE METABOLIC PANEL WITH GFR, Lipid panel, Hemoglobin A1c  Essential hypertension  Pure hypercholesterolemia  The rash on the patient's leg appears to be papular urticaria most likely due to insect bites.  Also on the differential diagnosis would be an atypical presentation of shingles however it is itching more than hurting and therefore I will treated with triamcinolone cream 2-3 times a day until better.  Blood pressure today is outstanding.  I will check a CBC, CMP, fasting lipid panel, and an A1c.  I anticipate that her A1c will still be elevated.  I anticipate that is due to the fact she is not taking any insulin at night.  The 7030 only allows adequate coverage for approximately 12 hours during the daytime and therefore I suspect that her sugars at night are very high particular around dinnertime.  Therefore if the A1c is elevated as I anticipate I will tell her to resume 10 units of insulin at night.  However without any sugars to go by, I would like to see the A1c first.  She did receive her flu shot today

## 2020-08-18 LAB — LIPID PANEL
Cholesterol: 169 mg/dL (ref <200)
HDL: 70 mg/dL (ref >=50)
LDL Cholesterol (Calc): 79 mg/dL (calc)
Non-HDL Cholesterol (Calc): 99 mg/dL (calc) (ref <130)
Total CHOL/HDL Ratio: 2.4 (calc) (ref <5.0)
Triglycerides: 114 mg/dL (ref <150)

## 2020-08-18 LAB — CBC WITH DIFFERENTIAL/PLATELET
Absolute Monocytes: 390 cells/uL (ref 200–950)
Basophils Absolute: 42 cells/uL (ref 0–200)
Basophils Relative: 0.8 %
Eosinophils Absolute: 73 cells/uL (ref 15–500)
Eosinophils Relative: 1.4 %
HCT: 36.3 % (ref 35.0–45.0)
Hemoglobin: 11.6 g/dL — ABNORMAL LOW (ref 11.7–15.5)
Lymphs Abs: 2652 cells/uL (ref 850–3900)
MCH: 29.3 pg (ref 27.0–33.0)
MCHC: 32 g/dL (ref 32.0–36.0)
MCV: 91.7 fL (ref 80.0–100.0)
MPV: 9.9 fL (ref 7.5–12.5)
Monocytes Relative: 7.5 %
Neutro Abs: 2044 cells/uL (ref 1500–7800)
Neutrophils Relative %: 39.3 %
Platelets: 269 10*3/uL (ref 140–400)
RBC: 3.96 10*6/uL (ref 3.80–5.10)
RDW: 13.2 % (ref 11.0–15.0)
Total Lymphocyte: 51 %
WBC: 5.2 10*3/uL (ref 3.8–10.8)

## 2020-08-18 LAB — COMPLETE METABOLIC PANEL WITH GFR
AG Ratio: 1.4 (calc) (ref 1.0–2.5)
ALT: 10 U/L (ref 6–29)
AST: 19 U/L (ref 10–35)
Albumin: 3.8 g/dL (ref 3.6–5.1)
Alkaline phosphatase (APISO): 81 U/L (ref 37–153)
BUN/Creatinine Ratio: 12 (calc) (ref 6–22)
BUN: 14 mg/dL (ref 7–25)
CO2: 23 mmol/L (ref 20–32)
Calcium: 9.4 mg/dL (ref 8.6–10.4)
Chloride: 104 mmol/L (ref 98–110)
Creat: 1.14 mg/dL — ABNORMAL HIGH (ref 0.60–0.88)
GFR, Est African American: 51 mL/min/{1.73_m2} — ABNORMAL LOW (ref >=60)
GFR, Est Non African American: 44 mL/min/{1.73_m2} — ABNORMAL LOW (ref >=60)
Globulin: 2.7 g/dL (calc) (ref 1.9–3.7)
Glucose, Bld: 75 mg/dL (ref 65–99)
Potassium: 4.5 mmol/L (ref 3.5–5.3)
Sodium: 140 mmol/L (ref 135–146)
Total Bilirubin: 0.9 mg/dL (ref 0.2–1.2)
Total Protein: 6.5 g/dL (ref 6.1–8.1)

## 2020-08-18 LAB — HEMOGLOBIN A1C
Hgb A1c MFr Bld: 6.7 % of total Hgb — ABNORMAL HIGH (ref <5.7)
Mean Plasma Glucose: 146 (calc)
eAG (mmol/L): 8.1 (calc)

## 2020-08-23 ENCOUNTER — Other Ambulatory Visit: Payer: Self-pay

## 2020-10-25 ENCOUNTER — Encounter: Payer: Self-pay | Admitting: Nurse Practitioner

## 2020-10-25 ENCOUNTER — Ambulatory Visit (INDEPENDENT_AMBULATORY_CARE_PROVIDER_SITE_OTHER): Payer: Medicare HMO | Admitting: Nurse Practitioner

## 2020-10-25 ENCOUNTER — Other Ambulatory Visit: Payer: Self-pay

## 2020-10-25 VITALS — BP 152/70 | HR 100 | Temp 98.8°F | Ht 65.5 in | Wt 232.0 lb

## 2020-10-25 DIAGNOSIS — M79674 Pain in right toe(s): Secondary | ICD-10-CM

## 2020-10-25 DIAGNOSIS — Z794 Long term (current) use of insulin: Secondary | ICD-10-CM

## 2020-10-25 DIAGNOSIS — E118 Type 2 diabetes mellitus with unspecified complications: Secondary | ICD-10-CM

## 2020-10-25 MED ORDER — PREDNISONE 10 MG PO TABS: ORAL_TABLET | ORAL | 0 refills | Status: AC

## 2020-10-25 NOTE — Assessment & Plan Note (Signed)
Acute, ongoing x 3 days.  Likely gout.  Will check uric acid level and treat with prednisone taper.  Not good candidate for oral NSAIDs due to slight kidney dysfunction.  With first flare, also not good candidate for colchicine at this time.  Plan to follow up next week if not resolved.

## 2020-10-25 NOTE — Progress Notes (Signed)
Subjective:    Patient ID: Amanda Riddle, female    DOB: [birth year], 86 y.o.   MRN: [id number]  HPI: Amanda Riddle is a 86 y.o. female presenting for right great toe pain.  Chief Complaint  Patient presents with  . Pain    Pain in great toe staring Sunday night on the right foot. Taking tylenol for the pain   RIGHT TOE PAIN Duration: days Involved foot: right great toe Mechanism of injury: unknown Location: right great toe Onset: sudden  Severity: severe  Quality: toothache, sharp Frequency: comes and goes Radiation: no Aggravating factors: walking on it, movement  Alleviating factors: Tylenol  Status: somewhat improved Treatments attempted: Tylenol, massage, muscle rub  Relief with NSAIDs?:  mild Weakness with weight bearing or walking: yes Morning stiffness: no Swelling: yes Redness: no Bruising: no Paresthesias / decreased sensation: no  Fevers:no  Allergies  Allergen Reactions  . Codeine Itching  . Lisinopril Swelling    Outpatient Encounter Medications as of 10/25/2020  Medication Sig  . Accu-Chek FastClix Lancets MISC Checks BS 4x daily d/t fluctuating blood glucose. Dx: E11.65  . albuterol (PROVENTIL HFA;VENTOLIN HFA) 108 (90 BASE) MCG/ACT inhaler Inhale 2 puffs into the lungs every 6 (six) hours as needed. Shortness of breath  . amLODipine (NORVASC) 5 MG tablet TAKE 1 TABLET BY MOUTH EVERY DAY  . aspirin EC 81 MG tablet Take 81 mg by mouth at bedtime.  Marland Kitchen atorvastatin (LIPITOR) 40 MG tablet Take 1 tablet (40 mg total) by mouth daily.  . Blood Glucose Monitoring Suppl (ACCU-CHEK AVIVA PLUS) w/Device KIT Checks BS 4x daily d/t fluctuating blood glucose. Dx: E11.65  . clotrimazole-betamethasone (LOTRISONE) cream Apply 1 application topically 2 (two) times daily.  . Cyanocobalamin (VITAMIN B 12 PO) Take 1 tablet by mouth at bedtime.   . Flaxseed, Linseed, (FLAXSEED OIL PO) Take 1 capsule by mouth 2 (two) times daily.   . Glucosamine Sulfate-MSM  (MSM-GLUCOSAMINE PO) Take 1 capsule by mouth 2 (two) times daily.  Marland Kitchen glucose blood (ACCU-CHEK AVIVA PLUS) test strip Checks BS 4x daily d/t fluctuating blood glucose. Dx: E11.65  . hydrochlorothiazide (HYDRODIURIL) 25 MG tablet TAKE 1 TABLET BY MOUTH EVERY DAY  . Insulin Pen Needle (B-D UF III MINI PEN NEEDLES) 31G X 5 MM MISC 1 each by Does not apply route daily. CHECK BS QID (FOUR TIMES A DAY) Dx e11.9  . Insulin Syringes, Disposable, U-100 1 ML MISC Use BID with insulin  . Lancets Misc. (ACCU-CHEK FASTCLIX LANCET) KIT Checks BS 4x daily d/t fluctuating blood glucose. Dx: E11.65  . meclizine (ANTIVERT) 12.5 MG tablet TAKE 1 TABLET BY MOUTH 3 TIMES DAILY AS NEEDED FOR DIZZINESS.  . metFORMIN (GLUCOPHAGE) 1000 MG tablet TAKE 1 TABLET BY MOUTH TWICE A DAY WITH A MEAL  . NOVOLIN 70/30 RELION (70-30) 100 UNIT/ML injection INJECT 30  UNITS SUBCUTANEOUSLY IN THE MORNING AND 15 UNITS SUBCUTANEOUSLY IN THE AFTERNOON  . pioglitazone (ACTOS) 30 MG tablet TAKE 1 TABLET BY MOUTH EVERY DAY  . predniSONE (DELTASONE) 10 MG tablet Take 4 tablets (40 mg total) by mouth daily with breakfast for 2 days, THEN 3 tablets (30 mg total) daily with breakfast for 2 days, THEN 2 tablets (20 mg total) daily with breakfast for 2 days, THEN 1 tablet (10 mg total) daily with breakfast for 2 days.  Marland Kitchen triamcinolone cream (KENALOG) 0.1 % Apply 1 application topically 2 (two) times daily.   No facility-administered encounter medications on file as of 10/25/2020.  Patient Active Problem List   Diagnosis Date Noted  . Great toe pain, right 10/25/2020  . Pain in joint, shoulder region 07/06/2013  . Muscle spasms of neck 07/06/2013  . Hyperglycemia 01/17/2012  . HTN (hypertension) 01/17/2012  . Hyperlipidemia 01/17/2012  . DM (diabetes mellitus) ([place]) 01/17/2012    Past Medical History:  Diagnosis Date  . Diabetes mellitus   . Diverticulosis   . Hypercholesteremia   . Hypertension     Relevant past medical, surgical,  family and social history reviewed and updated as indicated. Interim medical history since our last visit reviewed.  Review of Systems  Constitutional: Negative.  Negative for fever.  Musculoskeletal: Positive for arthralgias, gait problem and joint swelling. Negative for back pain, myalgias, neck pain and neck stiffness.  Skin: Positive for color change. Negative for pallor, rash and wound.  Neurological: Negative for dizziness, weakness, numbness and headaches.  Psychiatric/Behavioral: Negative.     Per HPI unless specifically indicated above     Objective:    BP (!) 152/70   Pulse 100   Temp 98.8 F (37.1 C)   Ht 5' 5.5" (1.664 m)   Wt 232 lb (105.2 kg)   SpO2 96%   BMI 38.02 kg/m   Wt Readings from Last 3 Encounters:  10/25/20 232 lb (105.2 kg)  08/17/20 236 lb (107 kg)  12/31/19 238 lb (108 kg)    Physical Exam Vitals and nursing note reviewed.  Constitutional:      General: She is not in acute distress.    Appearance: Normal appearance. She is obese. She is not toxic-appearing.  Cardiovascular:     Pulses:          Dorsalis pedis pulses are 2+ on the right side and 2+ on the left side.       Posterior tibial pulses are 2+ on the right side and 2+ on the left side.  Musculoskeletal:     Right foot: Normal capillary refill. Tenderness (right great toe) present. Normal pulse.     Left foot: Normal. Normal capillary refill. No tenderness. Normal pulse.  Feet:     Right foot:     Skin integrity: Skin integrity normal.     Toenail Condition: Right toenails are long.     Left foot:     Skin integrity: Skin integrity normal.     Toenail Condition: Left toenails are long.  Skin:    General: Skin is warm and dry.     Capillary Refill: Capillary refill takes less than 2 seconds.     Coloration: Skin is not jaundiced or pale.     Findings: Erythema (right great toe) present.  Neurological:     Mental Status: She is alert and oriented to person, place, and time.      Gait: Gait abnormal (gait favoring right foot).  Psychiatric:        Mood and Affect: Mood normal.        Behavior: Behavior normal.        Thought Content: Thought content normal.        Judgment: Judgment normal.        Assessment & Plan:   Problem List Items Addressed This Visit      Other   Great toe pain, right    Acute, ongoing x 3 days.  Likely gout.  Will check uric acid level and treat with prednisone taper.  Not good candidate for oral NSAIDs due to slight kidney dysfunction.  With first flare,  also not good candidate for colchicine at this time.  Plan to follow up next week if not resolved.        Relevant Medications   predniSONE (DELTASONE) 10 MG tablet   Other Relevant Orders   Uric acid    Other Visit Diagnoses    Controlled type 2 diabetes mellitus with complication, with long-term current use of insulin ([place])    -  Primary   Relevant Orders   Microalbumin / creatinine urine ratio       Follow up plan: Return in about 5 years (around 10/25/2025) for toe pain f/u.

## 2020-10-25 NOTE — Patient Instructions (Signed)

## 2020-10-26 DIAGNOSIS — E118 Type 2 diabetes mellitus with unspecified complications: Secondary | ICD-10-CM | POA: Diagnosis not present

## 2020-10-26 DIAGNOSIS — Z794 Long term (current) use of insulin: Secondary | ICD-10-CM | POA: Diagnosis not present

## 2020-10-26 LAB — URIC ACID: Uric Acid, Serum: 8.7 mg/dL — ABNORMAL HIGH (ref 2.5–7.0)

## 2020-10-26 NOTE — Addendum Note (Signed)
Addended by: Cathlean Marseilles A on: 10/26/2020 08:07 AM   Modules accepted: Orders

## 2020-10-27 LAB — MICROALBUMIN / CREATININE URINE RATIO
Creatinine, Urine: 82 mg/dL (ref 20–275)
Microalb Creat Ratio: 17 mcg/mg creat (ref <30)
Microalb, Ur: 1.4 mg/dL

## 2020-10-31 ENCOUNTER — Encounter: Payer: Self-pay | Admitting: Nurse Practitioner

## 2020-11-02 ENCOUNTER — Telehealth: Payer: Self-pay | Admitting: Family Medicine

## 2020-11-02 DIAGNOSIS — IMO0002 Reserved for concepts with insufficient information to code with codable children: Secondary | ICD-10-CM

## 2020-11-02 DIAGNOSIS — E1165 Type 2 diabetes mellitus with hyperglycemia: Secondary | ICD-10-CM

## 2020-11-02 MED ORDER — NOVOLIN 70/30 FLEXPEN (70-30) 100 UNIT/ML ~~LOC~~ SUPN: PEN_INJECTOR | SUBCUTANEOUS | 11 refills | Status: DC

## 2020-11-02 MED ORDER — BD PEN NEEDLE MINI U/F 31G X 5 MM MISC: 11 refills | Status: DC

## 2020-11-02 NOTE — Telephone Encounter (Signed)
pt need a refill on insulin  but would like to go with the pin instead of the needle??

## 2020-11-02 NOTE — Telephone Encounter (Signed)
Prescription sent to pharmacy.

## 2020-11-07 ENCOUNTER — Other Ambulatory Visit: Payer: Self-pay | Admitting: Family Medicine

## 2020-11-08 ENCOUNTER — Telehealth: Payer: Self-pay | Admitting: Family Medicine

## 2020-11-16 ENCOUNTER — Ambulatory Visit (INDEPENDENT_AMBULATORY_CARE_PROVIDER_SITE_OTHER): Payer: Medicare HMO | Admitting: Family Medicine

## 2020-11-16 ENCOUNTER — Other Ambulatory Visit: Payer: Self-pay

## 2020-11-16 VITALS — BP 132/80 | HR 94 | Temp 96.9°F | Ht 65.0 in | Wt 233.0 lb

## 2020-11-16 DIAGNOSIS — E7800 Pure hypercholesterolemia, unspecified: Secondary | ICD-10-CM

## 2020-11-16 DIAGNOSIS — I10 Essential (primary) hypertension: Secondary | ICD-10-CM

## 2020-11-16 DIAGNOSIS — Z794 Long term (current) use of insulin: Secondary | ICD-10-CM | POA: Diagnosis not present

## 2020-11-16 DIAGNOSIS — E118 Type 2 diabetes mellitus with unspecified complications: Secondary | ICD-10-CM | POA: Diagnosis not present

## 2020-11-16 MED ORDER — METFORMIN HCL 1000 MG PO TABS: ORAL_TABLET | ORAL | 1 refills | Status: DC

## 2020-11-16 MED ORDER — PIOGLITAZONE HCL 30 MG PO TABS: 30.0000 mg | ORAL_TABLET | Freq: Every day | ORAL | 3 refills | Status: DC

## 2020-11-16 MED ORDER — ATORVASTATIN CALCIUM 40 MG PO TABS: 40.0000 mg | ORAL_TABLET | Freq: Every day | ORAL | 2 refills | Status: DC

## 2020-11-16 MED ORDER — NOVOLIN 70/30 FLEXPEN (70-30) 100 UNIT/ML ~~LOC~~ SUPN: PEN_INJECTOR | SUBCUTANEOUS | 11 refills | Status: DC

## 2020-11-16 MED ORDER — AMLODIPINE BESYLATE 5 MG PO TABS: 5.0000 mg | ORAL_TABLET | Freq: Every day | ORAL | 3 refills | Status: DC

## 2020-11-16 MED ORDER — ALBUTEROL SULFATE HFA 108 (90 BASE) MCG/ACT IN AERS: 2.0000 | INHALATION_SPRAY | Freq: Four times a day (QID) | RESPIRATORY_TRACT | 2 refills | Status: DC | PRN

## 2020-11-16 NOTE — Progress Notes (Signed)
Subjective:    Patient ID: Amanda Riddle, female    DOB: 1935/04/10, 86 y.o.   MRN: [id number]  Patient is a very pleasant 86 year old African-American female who presents today for follow-up of her diabetes.  Since I last saw the patient, she had a gout exacerbation in her right great toe.  Her uric acid level at that time was 8.3.  She is better now.  We spent the majority of today's visit discussing gout.  She admits that she eats a diet rich in organ meat and shrimp and and beef.  She does not drink any alcohol.  She is also on hydrochlorothiazide.  She has never had a gout attack before.  She denies any chest pain shortness of breath or dyspnea on exertion.  Blood pressure today is well controlled.  She denies any hypoglycemic episodes.  She denies any polyuria polydipsia or blurry vision.  She denies any numbness in her feet.  She is already had her COVID booster. Past Medical History:  Diagnosis Date  . Diabetes mellitus   . Diverticulosis   . Hypercholesteremia   . Hypertension    Past Surgical History:  Procedure Laterality Date  . ABDOMINAL HYSTERECTOMY  1986  . TONSILLECTOMY  1950   Current Outpatient Medications on File Prior to Visit  Medication Sig Dispense Refill  . Accu-Chek FastClix Lancets MISC Checks BS 4x daily d/t fluctuating blood glucose. Dx: E11.65 200 each 3  . aspirin EC 81 MG tablet Take 81 mg by mouth at bedtime.    . Blood Glucose Monitoring Suppl (ACCU-CHEK AVIVA PLUS) w/Device KIT Checks BS 4x daily d/t fluctuating blood glucose. Dx: E11.65 1 kit 1  . clotrimazole-betamethasone (LOTRISONE) cream Apply 1 application topically 2 (two) times daily. 30 g 0  . Cyanocobalamin (VITAMIN B 12 PO) Take 1 tablet by mouth at bedtime.     . Flaxseed, Linseed, (FLAXSEED OIL PO) Take 1 capsule by mouth 2 (two) times daily.     . Glucosamine Sulfate-MSM (MSM-GLUCOSAMINE PO) Take 1 capsule by mouth 2 (two) times daily.    Marland Kitchen glucose blood (ACCU-CHEK AVIVA PLUS) test strip  Checks BS 4x daily d/t fluctuating blood glucose. Dx: E11.65 200 each 3  . hydrochlorothiazide (HYDRODIURIL) 25 MG tablet TAKE 1 TABLET BY MOUTH EVERY DAY 90 tablet 3  . Insulin Pen Needle (B-D UF III MINI PEN NEEDLES) 31G X 5 MM MISC Use as directed to inject insulin SQ BID. Dx: E11.9 200 each 11  . Lancets Misc. (ACCU-CHEK FASTCLIX LANCET) KIT Checks BS 4x daily d/t fluctuating blood glucose. Dx: E11.65 1 kit 1  . meclizine (ANTIVERT) 12.5 MG tablet TAKE 1 TABLET BY MOUTH 3 TIMES DAILY AS NEEDED FOR DIZZINESS. 40 tablet 0  . triamcinolone cream (KENALOG) 0.1 % Apply 1 application topically 2 (two) times daily. 30 g 0   No current facility-administered medications on file prior to visit.   Allergies  Allergen Reactions  . Codeine Itching  . Lisinopril Swelling   Social History   Socioeconomic History  . Marital status: Divorced    Spouse name: Not on file  . Number of children: 2  . Years of education: Not on file  . Highest education level: Not on file  Occupational History  . Not on file  Tobacco Use  . Smoking status: Never Smoker  . Smokeless tobacco: Current User    Types: Snuff  Substance and Sexual Activity  . Alcohol use: No  . Drug use: No  .  Sexual activity: Not on file  Other Topics Concern  . Not on file  Social History Narrative  . Not on file   Social Determinants of Health   Financial Resource Strain: Not on file  Food Insecurity: Not on file  Transportation Needs: Not on file  Physical Activity: Not on file  Stress: Not on file  Social Connections: Not on file  Intimate Partner Violence: Not on file     Review of Systems  All other systems reviewed and are negative.      Objective:   Physical Exam Vitals reviewed.  Constitutional:      Appearance: She is obese.  Cardiovascular:     Rate and Rhythm: Normal rate and regular rhythm.     Heart sounds: Normal heart sounds. No murmur heard. No friction rub. No gallop.   Pulmonary:     Effort:  Pulmonary effort is normal. No respiratory distress.     Breath sounds: Normal breath sounds. No stridor. No wheezing, rhonchi or rales.  Chest:     Chest wall: No tenderness.  Musculoskeletal:     Right lower leg: No edema.     Left lower leg: No edema.  Neurological:     Mental Status: She is alert.           Assessment & Plan:  Controlled type 2 diabetes mellitus with complication, with long-term current use of insulin ([facility]) - Plan: Hemoglobin A1c, COMPLETE METABOLIC PANEL WITH GFR, Lipid panel  Essential hypertension  Pure hypercholesterolemia  I will check her A1c today.  Goal A1c is less than 7.  Blood pressure is well controlled.  I will check a fasting lipid panel.  Goal LDL cholesterol is less than 100.  I explained to the patient the hydrochlorothiazide does increase her risk of gallop.  We can stop hydrochlorothiazide and increase amlodipine to 10 mg a day.  However she is also on Actos and I explained that this combination of changes may increase her risk of leg swelling.  We also discussed starting allopurinol.  Patient has never had an issue with gout before.  For those reasons, she elects to not make any changes at the present time other than trying to reduce her consumption of shrimp and organ meat.  She will wait and see if she has another gout exacerbation.  At that time if she does I would stop hydrochlorothiazide and up amlodipine to 10 and consider starting allopurinol.  Spent more than 25 minutes today with the patient explaining the situation and the causes of gout

## 2020-11-17 ENCOUNTER — Encounter: Payer: Self-pay | Admitting: *Deleted

## 2020-11-17 LAB — COMPLETE METABOLIC PANEL WITH GFR
AG Ratio: 1.5 (calc) (ref 1.0–2.5)
ALT: 13 U/L (ref 6–29)
AST: 16 U/L (ref 10–35)
Albumin: 3.8 g/dL (ref 3.6–5.1)
Alkaline phosphatase (APISO): 83 U/L (ref 37–153)
BUN/Creatinine Ratio: 11 (calc) (ref 6–22)
BUN: 11 mg/dL (ref 7–25)
CO2: 27 mmol/L (ref 20–32)
Calcium: 9.6 mg/dL (ref 8.6–10.4)
Chloride: 103 mmol/L (ref 98–110)
Creat: 0.99 mg/dL — ABNORMAL HIGH (ref 0.60–0.88)
GFR, Est African American: 61 mL/min/{1.73_m2} (ref >=60)
GFR, Est Non African American: 53 mL/min/{1.73_m2} — ABNORMAL LOW (ref >=60)
Globulin: 2.6 g/dL (calc) (ref 1.9–3.7)
Glucose, Bld: 120 mg/dL — ABNORMAL HIGH (ref 65–99)
Potassium: 4.2 mmol/L (ref 3.5–5.3)
Sodium: 140 mmol/L (ref 135–146)
Total Bilirubin: 1 mg/dL (ref 0.2–1.2)
Total Protein: 6.4 g/dL (ref 6.1–8.1)

## 2020-11-17 LAB — HEMOGLOBIN A1C
Hgb A1c MFr Bld: 7.2 % of total Hgb — ABNORMAL HIGH (ref <5.7)
Mean Plasma Glucose: 160 mg/dL
eAG (mmol/L): 8.9 mmol/L

## 2020-11-17 LAB — LIPID PANEL
Cholesterol: 184 mg/dL (ref <200)
HDL: 74 mg/dL (ref >=50)
LDL Cholesterol (Calc): 87 mg/dL (calc)
Non-HDL Cholesterol (Calc): 110 mg/dL (calc) (ref <130)
Total CHOL/HDL Ratio: 2.5 (calc) (ref <5.0)
Triglycerides: 132 mg/dL (ref <150)

## 2020-11-23 ENCOUNTER — Telehealth: Payer: Self-pay | Admitting: Family Medicine

## 2020-11-23 NOTE — Telephone Encounter (Signed)
Pt daughter QUANNA WITTKE called stating that the pt has not received anything from [facility] but these meds: insulin isophane & regular human (NOVOLIN 70/30 FLEXPEN) (70-30) 100 UNIT/ML KwikPen  albuterol (VENTOLIN HFA) 108 (90 Base) MCG/ACT inhaler  She wanted to get meds sent to [facility] on [address] says that pt is out of all meds.  Cb#: [phone]

## 2020-11-23 NOTE — Telephone Encounter (Signed)
Called pt's daughter and left message for her to call bk regarding medication request

## 2020-12-13 NOTE — Telephone Encounter (Signed)
Error

## 2021-01-22 ENCOUNTER — Ambulatory Visit (INDEPENDENT_AMBULATORY_CARE_PROVIDER_SITE_OTHER): Payer: Medicare HMO | Admitting: Nurse Practitioner

## 2021-01-22 ENCOUNTER — Telehealth: Payer: Self-pay | Admitting: Family Medicine

## 2021-01-22 ENCOUNTER — Other Ambulatory Visit: Payer: Self-pay

## 2021-01-22 VITALS — BP 142/78 | HR 81 | Temp 97.0°F | Ht 65.0 in | Wt 224.0 lb

## 2021-01-22 DIAGNOSIS — Z794 Long term (current) use of insulin: Secondary | ICD-10-CM

## 2021-01-22 DIAGNOSIS — F4321 Adjustment disorder with depressed mood: Secondary | ICD-10-CM | POA: Diagnosis not present

## 2021-01-22 DIAGNOSIS — E118 Type 2 diabetes mellitus with unspecified complications: Secondary | ICD-10-CM | POA: Diagnosis not present

## 2021-01-22 LAB — GLUCOSE 16585: Glucose: 151 mg/dL — ABNORMAL HIGH (ref 65–99)

## 2021-01-22 MED ORDER — BD PEN NEEDLE MINI U/F 31G X 5 MM MISC: 11 refills | Status: DC

## 2021-01-22 NOTE — Patient Instructions (Signed)
As scheduled with PCP

## 2021-01-22 NOTE — Telephone Encounter (Signed)
Paperwork placed in yellow folder.

## 2021-01-22 NOTE — Progress Notes (Signed)
Subjective:    Patient ID: Amanda Riddle, female    DOB: 1934-11-09, 86 y.o.   MRN: [id number]  HPI: Amanda Riddle is a 86 y.o. female presenting for diabetes check.  Chief Complaint  Patient presents with  . Diabetes    Pt came today to gven needle. More will be sent. Says she has been having some chest pain and insomnia along with loss of appetite   DIABETES  She got new insulin pens in the mail and they did not come with the caps.    Has not had much of an appetite for the past 2 weeks since her sister has been sick.  Hypoglycemic episodes:no Polydipsia/polyuria: no Visual disturbance: no Chest pain: yes; mild Paresthesias: no Glucose Monitoring: not regularly, last time she check sugar at home it was 97. Taking Insulin?: yes, ran out over the weekend  Novolin 70/30 - 30 units in the morning and 15 units in the afternoon Blood Pressure Monitoring: not checking Retinal Examination: Not up to Date Pneumovax: Up to Date Influenza: Up to Date Aspirin: yes  Reports she lost her sister last night.  She is one of eight children and has also lost a brother earlier this year.  She is tearful today, but reports she has a daughter close by and is planning on seeing her today.  She reports the burial for her brother is today.  She denies suicidal thoughts.  She does not want to talk about her feelings with anybody.  Allergies  Allergen Reactions  . Codeine Itching  . Lisinopril Swelling    Outpatient Encounter Medications as of 01/22/2021  Medication Sig  . Accu-Chek FastClix Lancets MISC Checks BS 4x daily d/t fluctuating blood glucose. Dx: E11.65  . albuterol (VENTOLIN HFA) 108 (90 Base) MCG/ACT inhaler Inhale 2 puffs into the lungs every 6 (six) hours as needed. Shortness of breath  . amLODipine (NORVASC) 5 MG tablet Take 1 tablet (5 mg total) by mouth daily.  Marland Kitchen aspirin EC 81 MG tablet Take 81 mg by mouth at bedtime.  Marland Kitchen atorvastatin (LIPITOR) 40 MG tablet Take 1 tablet (40 mg  total) by mouth daily.  . Blood Glucose Monitoring Suppl (ACCU-CHEK AVIVA PLUS) w/Device KIT Checks BS 4x daily d/t fluctuating blood glucose. Dx: E11.65  . clotrimazole-betamethasone (LOTRISONE) cream Apply 1 application topically 2 (two) times daily.  . Cyanocobalamin (VITAMIN B 12 PO) Take 1 tablet by mouth at bedtime.   . Flaxseed, Linseed, (FLAXSEED OIL PO) Take 1 capsule by mouth 2 (two) times daily.   . Glucosamine Sulfate-MSM (MSM-GLUCOSAMINE PO) Take 1 capsule by mouth 2 (two) times daily.  Marland Kitchen glucose blood (ACCU-CHEK AVIVA PLUS) test strip Checks BS 4x daily d/t fluctuating blood glucose. Dx: E11.65  . hydrochlorothiazide (HYDRODIURIL) 25 MG tablet TAKE 1 TABLET BY MOUTH EVERY DAY  . insulin isophane & regular human (NOVOLIN 70/30 FLEXPEN) (70-30) 100 UNIT/ML KwikPen INJECT 30  UNITS SUBCUTANEOUSLY IN THE MORNING AND 15 UNITS SUBCUTANEOUSLY IN THE AFTERNOON  . Lancets Misc. (ACCU-CHEK FASTCLIX LANCET) KIT Checks BS 4x daily d/t fluctuating blood glucose. Dx: E11.65  . meclizine (ANTIVERT) 12.5 MG tablet TAKE 1 TABLET BY MOUTH 3 TIMES DAILY AS NEEDED FOR DIZZINESS.  . metFORMIN (GLUCOPHAGE) 1000 MG tablet Take 1 tablet by mouth two times daily  . pioglitazone (ACTOS) 30 MG tablet Take 1 tablet (30 mg total) by mouth daily.  Marland Kitchen triamcinolone cream (KENALOG) 0.1 % Apply 1 application topically 2 (two) times daily.  . [  DISCONTINUED] Insulin Pen Needle (B-D UF III MINI PEN NEEDLES) 31G X 5 MM MISC Use as directed to inject insulin SQ BID. Dx: E11.9  . Insulin Pen Needle (B-D UF III MINI PEN NEEDLES) 31G X 5 MM MISC Use as directed to inject insulin SQ BID. Dx: E11.9   No facility-administered encounter medications on file as of 01/22/2021.    Patient Active Problem List   Diagnosis Date Noted  . Great toe pain, right 10/25/2020  . Pain in joint, shoulder region 07/06/2013  . Muscle spasms of neck 07/06/2013  . Hyperglycemia 01/17/2012  . HTN (hypertension) 01/17/2012  . Hyperlipidemia  01/17/2012  . DM (diabetes mellitus) ([place]) 01/17/2012    Past Medical History:  Diagnosis Date  . Diabetes mellitus   . Diverticulosis   . Hypercholesteremia   . Hypertension     Relevant past medical, surgical, family and social history reviewed and updated as indicated. Interim medical history since our last visit reviewed.  Review of Systems Per HPI unless specifically indicated above     Objective:    BP (!) 142/78   Pulse 81   Temp (!) 97 F (36.1 C)   Ht _0  (1.651 m)   Wt 224 lb (101.6 kg)   SpO2 96%   BMI 37.28 kg/m   Wt Readings from Last 3 Encounters:  01/22/21 224 lb (101.6 kg)  11/16/20 233 lb (105.7 kg)  10/25/20 232 lb (105.2 kg)    Physical Exam Vitals and nursing note reviewed.  Constitutional:      General: She is not in acute distress.    Appearance: Normal appearance. She is obese. She is not toxic-appearing.  Skin:    General: Skin is warm and dry.     Capillary Refill: Capillary refill takes less than 2 seconds.     Coloration: Skin is not jaundiced or pale.     Findings: No erythema.  Neurological:     Mental Status: She is alert and oriented to person, place, and time.     Motor: No weakness.     Gait: Gait normal.  Psychiatric:        Attention and Perception: Attention and perception normal.        Mood and Affect: Mood normal. Affect is tearful.        Speech: Speech normal.        Behavior: Behavior normal.        Thought Content: Thought content normal.        Cognition and Memory: Cognition and memory normal.        Judgment: Judgment normal.     Results for orders placed or performed in visit on 11/16/20  Hemoglobin A1c  Result Value Ref Range   Hgb A1c MFr Bld 7.2 (H) <5.7 % of total Hgb   Mean Plasma Glucose 160 mg/dL   eAG (mmol/L) 8.9 mmol/L  COMPLETE METABOLIC PANEL WITH GFR  Result Value Ref Range   Glucose, Bld 120 (H) 65 - 99 mg/dL   BUN 11 7 - 25 mg/dL   Creat 0.99 (H) 0.60 - 0.88 mg/dL   GFR, Est Non  African American 53 (L) > OR = 60 mL/min/1.50m   GFR, Est African American 61 > OR = 60 mL/min/1.736m  BUN/Creatinine Ratio 11 6 - 22 (calc)   Sodium 140 135 - 146 mmol/L   Potassium 4.2 3.5 - 5.3 mmol/L   Chloride 103 98 - 110 mmol/L   CO2 27 20 - 32  mmol/L   Calcium 9.6 8.6 - 10.4 mg/dL   Total Protein 6.4 6.1 - 8.1 g/dL   Albumin 3.8 3.6 - 5.1 g/dL   Globulin 2.6 1.9 - 3.7 g/dL (calc)   AG Ratio 1.5 1.0 - 2.5 (calc)   Total Bilirubin 1.0 0.2 - 1.2 mg/dL   Alkaline phosphatase (APISO) 83 37 - 153 U/L   AST 16 10 - 35 U/L   ALT 13 6 - 29 U/L  Lipid panel  Result Value Ref Range   Cholesterol 184 <200 mg/dL   HDL 74 > OR = 50 mg/dL   Triglycerides 132 <150 mg/dL   LDL Cholesterol (Calc) 87 mg/dL (calc)   Total CHOL/HDL Ratio 2.5 <5.0 (calc)   Non-HDL Cholesterol (Calc) 110 <130 mg/dL (calc)      Assessment & Plan:  1. Controlled type 2 diabetes mellitus with complication, with long-term current use of insulin (HCC) Chronic.  Blood sugar in the office today was 151.  My nurse showed her how to use the kiwk pen with needles and we gave her a box of needles.  A prescription was sent in for needles for her to have moving forward.  Encouraged patient to check blood sugar twice daily before taking the insulin, although she reports she does not like to do this because it hurts.  I reiterated the importance of this.  She is to start eating three meals per day high in protein and follow up with PCP as scheduled.  - GLUCOSE 09906 - Insulin Pen Needle (B-D UF III MINI PEN NEEDLES) 31G X 5 MM MISC; Use as directed to inject insulin SQ BID. Dx: E11.9  Dispense: 200 each; Refill: 11  2. Grief reaction Reports her sister was in the hospital and she passed away last evening.  Also lost her brother earlier this year.  She is not interested in talking to a Counselor at this time, reports she knows how to work through her grief.  I encouraged her to reach out to her family members and daughters  during this difficult time.    Follow up plan: Return for as scheduled with PCP.

## 2021-01-22 NOTE — Telephone Encounter (Signed)
Received call from patient's daughter to request application for disability placard. Patient will complete application and sign request after paperwork has been completed by provider. Please call patient when paperwork is ready at [phone].

## 2021-01-26 ENCOUNTER — Other Ambulatory Visit: Payer: Self-pay | Admitting: *Deleted

## 2021-01-26 DIAGNOSIS — I10 Essential (primary) hypertension: Secondary | ICD-10-CM

## 2021-01-26 MED ORDER — "INSULIN SYRINGE-NEEDLE U-100 31G X 5/16"" 0.3 ML MISC": 3 refills | Status: DC

## 2021-01-26 MED ORDER — HYDROCHLOROTHIAZIDE 25 MG PO TABS: 1.0000 | ORAL_TABLET | Freq: Every day | ORAL | 3 refills | Status: DC

## 2021-01-26 MED ORDER — AMLODIPINE BESYLATE 5 MG PO TABS: 5.0000 mg | ORAL_TABLET | Freq: Every day | ORAL | 3 refills | Status: DC

## 2021-01-26 MED ORDER — PIOGLITAZONE HCL 30 MG PO TABS: 30.0000 mg | ORAL_TABLET | Freq: Every day | ORAL | 3 refills | Status: DC

## 2021-01-26 MED ORDER — GNP TRUE METRIX GLUCOSE METER W/DEVICE KIT: 1.0000 | PACK | Freq: Once | 1 refills | Status: DC

## 2021-01-26 MED ORDER — GNP TRUE METRIX GLUCOSE STRIPS VI STRP: ORAL_STRIP | 1 refills | Status: DC

## 2021-01-26 MED ORDER — TRUEPLUS LANCETS 30G MISC: 1 refills | Status: DC

## 2021-02-07 ENCOUNTER — Telehealth: Payer: Self-pay | Admitting: Family Medicine

## 2021-02-07 NOTE — Telephone Encounter (Signed)
Patient called because she doesn't know if she should take her insulin this morning. Please advise asap at [phone]

## 2021-02-07 NOTE — Telephone Encounter (Signed)
Decrease 70/30 to 20 in the am and 7 in the pm.

## 2021-02-07 NOTE — Telephone Encounter (Signed)
Call placed to patient and patient made aware.  

## 2021-02-07 NOTE — Telephone Encounter (Signed)
Received call from patient.   Reports that she had gout attack and stopped eating meat due to concerns for uric acid. States that since she stopped eating meat, her blood sugars have been bottoming out. Reports that readings are 60-100.   Reports that she has stopped taking Novolin 15 U at night, but sugars are still low.   Advised that with gout, she should avoid organ meats and high purine foods. Advised that patient needs protein to keep glucose stable.    Advised to hold insulin if CBG < 100.  Verbalized understanding.

## 2021-02-08 ENCOUNTER — Encounter: Payer: Self-pay | Admitting: Family Medicine

## 2021-02-08 ENCOUNTER — Other Ambulatory Visit: Payer: Self-pay

## 2021-02-08 ENCOUNTER — Ambulatory Visit (INDEPENDENT_AMBULATORY_CARE_PROVIDER_SITE_OTHER): Payer: Medicare HMO | Admitting: Family Medicine

## 2021-02-08 VITALS — BP 140/88 | HR 80 | Temp 97.9°F | Resp 14 | Ht 65.0 in | Wt 224.0 lb

## 2021-02-08 DIAGNOSIS — R233 Spontaneous ecchymoses: Secondary | ICD-10-CM | POA: Diagnosis not present

## 2021-02-08 DIAGNOSIS — T148XXA Other injury of unspecified body region, initial encounter: Secondary | ICD-10-CM | POA: Diagnosis not present

## 2021-02-08 DIAGNOSIS — D485 Neoplasm of uncertain behavior of skin: Secondary | ICD-10-CM

## 2021-02-08 DIAGNOSIS — E162 Hypoglycemia, unspecified: Secondary | ICD-10-CM

## 2021-02-08 DIAGNOSIS — L989 Disorder of the skin and subcutaneous tissue, unspecified: Secondary | ICD-10-CM | POA: Diagnosis not present

## 2021-02-08 DIAGNOSIS — L821 Other seborrheic keratosis: Secondary | ICD-10-CM | POA: Diagnosis not present

## 2021-02-08 NOTE — Progress Notes (Signed)
Subjective:    Patient ID: Amanda Riddle, female    DOB: [birth year], 86 y.o.   MRN: [id number]  Patient presents today with hypoglycemia.  She states that 1 morning her blood sugar had dropped as low as 63.  Since that time she has stopped her insulin.  She was taking more than 30 units in the morning and 15 units in the afternoon.  She has stopped her insulin and her blood sugar still averaging in the 140's per her report.  She is concerned due to a "rash on her anterior right thigh".  However the picture shows below that instead of a rash she is having bruising.  There is no other rash evident.  She also has a suspicious lesion underneath her left breast shown in the second picture that she wants evaluated.      Past Medical History:  Diagnosis Date  . Diabetes mellitus   . Diverticulosis   . Hypercholesteremia   . Hypertension    Past Surgical History:  Procedure Laterality Date  . ABDOMINAL HYSTERECTOMY  1986  . TONSILLECTOMY  1950   Current Outpatient Medications on File Prior to Visit  Medication Sig Dispense Refill  . albuterol (VENTOLIN HFA) 108 (90 Base) MCG/ACT inhaler Inhale 2 puffs into the lungs every 6 (six) hours as needed. Shortness of breath 3 each 2  . amLODipine (NORVASC) 5 MG tablet Take 1 tablet (5 mg total) by mouth daily. 90 tablet 3  . aspirin EC 81 MG tablet Take 81 mg by mouth at bedtime.    Marland Kitchen atorvastatin (LIPITOR) 40 MG tablet Take 1 tablet (40 mg total) by mouth daily. 90 tablet 2  . clotrimazole-betamethasone (LOTRISONE) cream Apply 1 application topically 2 (two) times daily. 30 g 0  . Cyanocobalamin (VITAMIN B 12 PO) Take 1 tablet by mouth at bedtime.     . Flaxseed, Linseed, (FLAXSEED OIL PO) Take 1 capsule by mouth 2 (two) times daily.     . Glucosamine Sulfate-MSM (MSM-GLUCOSAMINE PO) Take 1 capsule by mouth 2 (two) times daily.    Marland Kitchen glucose blood (GNP TRUE METRIX GLUCOSE STRIPS) test strip Use as directed to monitor FSBS 3x daiy. Dx: E11.65 300  each 1  . hydrochlorothiazide (HYDRODIURIL) 25 MG tablet Take 1 tablet (25 mg total) by mouth daily. 90 tablet 3  . Insulin Syringe-Needle U-100 (B-D INS SYR ULTRAFINE .3CC/31G) 31G X 5/16" 0.3 ML MISC Use as directed to inject insulin SQ 2x daiy. Dx: E11.65 300 each 3  . meclizine (ANTIVERT) 12.5 MG tablet TAKE 1 TABLET BY MOUTH 3 TIMES DAILY AS NEEDED FOR DIZZINESS. 40 tablet 0  . metFORMIN (GLUCOPHAGE) 1000 MG tablet Take 1 tablet by mouth two times daily 180 tablet 1  . pioglitazone (ACTOS) 30 MG tablet Take 1 tablet (30 mg total) by mouth daily. 90 tablet 3  . triamcinolone cream (KENALOG) 0.1 % Apply 1 application topically 2 (two) times daily. 30 g 0  . TRUEplus Lancets 30G MISC Use as directed to monitor FSBS 3x daiy. Dx: E11.65 300 each 1  . insulin isophane & regular human (NOVOLIN 70/30 FLEXPEN) (70-30) 100 UNIT/ML KwikPen INJECT 30  UNITS SUBCUTANEOUSLY IN THE MORNING AND 15 UNITS SUBCUTANEOUSLY IN THE AFTERNOON (Patient not taking: Reported on 02/08/2021) 15 mL 11   No current facility-administered medications on file prior to visit.   Allergies  Allergen Reactions  . Codeine Itching  . Lisinopril Swelling   Social History   Socioeconomic History  . Marital  status: Divorced    Spouse name: Not on file  . Number of children: 2  . Years of education: Not on file  . Highest education level: Not on file  Occupational History  . Not on file  Tobacco Use  . Smoking status: Never Smoker  . Smokeless tobacco: Current User    Types: Snuff  Substance and Sexual Activity  . Alcohol use: No  . Drug use: No  . Sexual activity: Not on file  Other Topics Concern  . Not on file  Social History Narrative  . Not on file   Social Determinants of Health   Financial Resource Strain: Not on file  Food Insecurity: Not on file  Transportation Needs: Not on file  Physical Activity: Not on file  Stress: Not on file  Social Connections: Not on file  Intimate Partner Violence: Not on  file     Review of Systems  All other systems reviewed and are negative.      Objective:   Physical Exam Vitals reviewed.  Constitutional:      Appearance: She is obese.  Cardiovascular:     Rate and Rhythm: Normal rate and regular rhythm.     Heart sounds: Normal heart sounds. No murmur heard. No friction rub. No gallop.   Pulmonary:     Effort: Pulmonary effort is normal. No respiratory distress.     Breath sounds: Normal breath sounds. No stridor. No wheezing, rhonchi or rales.  Chest:     Chest wall: No tenderness.    Musculoskeletal:     Right lower leg: No edema.     Left lower leg: No edema.       Legs:  Neurological:     Mental Status: She is alert.           Assessment & Plan:  Bruising - Plan: CBC with Differential/Platelet, COMPLETE METABOLIC PANEL WITH GFR  Skin lesion - Plan: Pathology Report ([facility])  Due to the hypoglycemia, I recommended she stop her insulin and record her fasting blood sugars every morning and her 2-hour postprandial sugars every evening.  Report those values to me in 1 week.  If her morning sugars are between 101 130 and her 2-hour postprandial sugars are between 101 180, I will not start her back on insulin.  Second, I reassured the patient that the rash on her anterior right thigh or bruises.  I will check a CBC to monitor platelet count and a CMP to monitor her kidney and liver function but assuming that the labs are normal, no other work-up is necessary for the bruises.  Third, the skin lesion underneath her left breast is concerning.  I anesthetized the lesion with 0.1% lidocaine with epinephrine.  I performed a shave biopsy using sterile technique and the lesion was sent to pathology in a labeled container.  The patient tolerated the procedure well without complication.  Hemostasis was achieved with Drysol and a Band-Aid.  Await the biopsy results.

## 2021-02-09 LAB — CBC WITH DIFFERENTIAL/PLATELET
Absolute Monocytes: 372 cells/uL (ref 200–950)
Basophils Absolute: 38 cells/uL (ref 0–200)
Basophils Relative: 0.6 %
Eosinophils Absolute: 82 cells/uL (ref 15–500)
Eosinophils Relative: 1.3 %
HCT: 38.6 % (ref 35.0–45.0)
Hemoglobin: 12.3 g/dL (ref 11.7–15.5)
Lymphs Abs: 3163 cells/uL (ref 850–3900)
MCH: 29.1 pg (ref 27.0–33.0)
MCHC: 31.9 g/dL — ABNORMAL LOW (ref 32.0–36.0)
MCV: 91.5 fL (ref 80.0–100.0)
MPV: 9.5 fL (ref 7.5–12.5)
Monocytes Relative: 5.9 %
Neutro Abs: 2646 cells/uL (ref 1500–7800)
Neutrophils Relative %: 42 %
Platelets: 265 10*3/uL (ref 140–400)
RBC: 4.22 10*6/uL (ref 3.80–5.10)
RDW: 13.1 % (ref 11.0–15.0)
Total Lymphocyte: 50.2 %
WBC: 6.3 10*3/uL (ref 3.8–10.8)

## 2021-02-09 LAB — COMPLETE METABOLIC PANEL WITH GFR
AG Ratio: 1.5 (calc) (ref 1.0–2.5)
ALT: 7 U/L (ref 6–29)
AST: 13 U/L (ref 10–35)
Albumin: 3.8 g/dL (ref 3.6–5.1)
Alkaline phosphatase (APISO): 77 U/L (ref 37–153)
BUN/Creatinine Ratio: 12 (calc) (ref 6–22)
BUN: 12 mg/dL (ref 7–25)
CO2: 23 mmol/L (ref 20–32)
Calcium: 9.1 mg/dL (ref 8.6–10.4)
Chloride: 104 mmol/L (ref 98–110)
Creat: 1.04 mg/dL — ABNORMAL HIGH (ref 0.60–0.88)
GFR, Est African American: 58 mL/min/{1.73_m2} — ABNORMAL LOW (ref >=60)
GFR, Est Non African American: 50 mL/min/{1.73_m2} — ABNORMAL LOW (ref >=60)
Globulin: 2.5 g/dL (calc) (ref 1.9–3.7)
Glucose, Bld: 151 mg/dL — ABNORMAL HIGH (ref 65–99)
Potassium: 3.7 mmol/L (ref 3.5–5.3)
Sodium: 139 mmol/L (ref 135–146)
Total Bilirubin: 0.8 mg/dL (ref 0.2–1.2)
Total Protein: 6.3 g/dL (ref 6.1–8.1)

## 2021-02-12 LAB — PATHOLOGY REPORT

## 2021-02-12 LAB — TISSUE SPECIMEN

## 2021-02-15 ENCOUNTER — Other Ambulatory Visit: Payer: Self-pay

## 2021-02-15 ENCOUNTER — Encounter: Payer: Self-pay | Admitting: Family Medicine

## 2021-02-15 ENCOUNTER — Ambulatory Visit (INDEPENDENT_AMBULATORY_CARE_PROVIDER_SITE_OTHER): Payer: Medicare HMO | Admitting: Family Medicine

## 2021-02-15 ENCOUNTER — Ambulatory Visit: Payer: Medicare HMO | Admitting: Family Medicine

## 2021-02-15 VITALS — BP 132/76 | HR 90 | Temp 98.9°F | Resp 14 | Ht 65.0 in | Wt 223.0 lb

## 2021-02-15 DIAGNOSIS — Z794 Long term (current) use of insulin: Secondary | ICD-10-CM

## 2021-02-15 DIAGNOSIS — E118 Type 2 diabetes mellitus with unspecified complications: Secondary | ICD-10-CM

## 2021-02-15 MED ORDER — SITAGLIPTIN PHOSPHATE 100 MG PO TABS: 100.0000 mg | ORAL_TABLET | Freq: Every day | ORAL | 6 refills | Status: DC

## 2021-02-15 NOTE — Progress Notes (Signed)
Subjective:    Patient ID: Amanda Riddle, female    DOB: 1935-09-02, 86 y.o.   MRN: [id number] 02/08/21 Patient presents today with hypoglycemia.  She states that 1 morning her blood sugar had dropped as low as 63.  Since that time she has stopped her insulin.  She was taking more than 30 units in the morning and 15 units in the afternoon.  She has stopped her insulin and her blood sugar still averaging in the 140's per her report.  She is concerned due to a "rash on her anterior right thigh".  However the picture shows below that instead of a rash she is having bruising.  There is no other rash evident.  She also has a suspicious lesion underneath her left breast shown in the second picture that she wants evaluated.     At that time, my plan was: Due to the hypoglycemia, I recommended she stop her insulin and record her fasting blood sugars every morning and her 2-hour postprandial sugars every evening.  Report those values to me in 1 week.  If her morning sugars are between 101 130 and her 2-hour postprandial sugars are between 101 180, I will not start her back on insulin.  Second, I reassured the patient that the rash on her anterior right thigh or bruises.  I will check a CBC to monitor platelet count and a CMP to monitor her kidney and liver function but assuming that the labs are normal, no other work-up is necessary for the bruises.  Third, the skin lesion underneath her left breast is concerning.  I anesthetized the lesion with 0.1% lidocaine with epinephrine.  I performed a shave biopsy using sterile technique and the lesion was sent to pathology in a labeled container.  The patient tolerated the procedure well without complication.  Hemostasis was achieved with Drysol and a Band-Aid.  Await the biopsy results.  02/15/21 Biopsy showed a pigmented seborrheic keratosis.  She has been off her insulin now for a week.  She states that the highest she has seen her blood sugar has been 184 in the  morning.  The vast majority are in the 130 range.  In the evening they have been less than 170. Past Medical History:  Diagnosis Date  . Diabetes mellitus   . Diverticulosis   . Hypercholesteremia   . Hypertension    Past Surgical History:  Procedure Laterality Date  . ABDOMINAL HYSTERECTOMY  1986  . TONSILLECTOMY  1950   Current Outpatient Medications on File Prior to Visit  Medication Sig Dispense Refill  . albuterol (VENTOLIN HFA) 108 (90 Base) MCG/ACT inhaler Inhale 2 puffs into the lungs every 6 (six) hours as needed. Shortness of breath 3 each 2  . amLODipine (NORVASC) 5 MG tablet Take 1 tablet (5 mg total) by mouth daily. 90 tablet 3  . aspirin EC 81 MG tablet Take 81 mg by mouth at bedtime.    Marland Kitchen atorvastatin (LIPITOR) 40 MG tablet Take 1 tablet (40 mg total) by mouth daily. 90 tablet 2  . clotrimazole-betamethasone (LOTRISONE) cream Apply 1 application topically 2 (two) times daily. 30 g 0  . Cyanocobalamin (VITAMIN B 12 PO) Take 1 tablet by mouth at bedtime.     . Flaxseed, Linseed, (FLAXSEED OIL PO) Take 1 capsule by mouth 2 (two) times daily.     . Glucosamine Sulfate-MSM (MSM-GLUCOSAMINE PO) Take 1 capsule by mouth 2 (two) times daily.    Marland Kitchen glucose blood (GNP TRUE METRIX  GLUCOSE STRIPS) test strip Use as directed to monitor FSBS 3x daiy. Dx: E11.65 300 each 1  . hydrochlorothiazide (HYDRODIURIL) 25 MG tablet Take 1 tablet (25 mg total) by mouth daily. 90 tablet 3  . insulin isophane & regular human (NOVOLIN 70/30 FLEXPEN) (70-30) 100 UNIT/ML KwikPen INJECT 30  UNITS SUBCUTANEOUSLY IN THE MORNING AND 15 UNITS SUBCUTANEOUSLY IN THE AFTERNOON (Patient taking differently: INJECT 30  UNITS SUBCUTANEOUSLY IN THE MORNING AND 15 UNITS SUBCUTANEOUSLY IN THE AFTERNOON) 15 mL 11  . Insulin Syringe-Needle U-100 (B-D INS SYR ULTRAFINE .3CC/31G) 31G X 5/16" 0.3 ML MISC Use as directed to inject insulin SQ 2x daiy. Dx: E11.65 300 each 3  . meclizine (ANTIVERT) 12.5 MG tablet TAKE 1 TABLET BY  MOUTH 3 TIMES DAILY AS NEEDED FOR DIZZINESS. 40 tablet 0  . metFORMIN (GLUCOPHAGE) 1000 MG tablet Take 1 tablet by mouth two times daily 180 tablet 1  . pioglitazone (ACTOS) 30 MG tablet Take 1 tablet (30 mg total) by mouth daily. 90 tablet 3  . triamcinolone cream (KENALOG) 0.1 % Apply 1 application topically 2 (two) times daily. 30 g 0  . TRUEplus Lancets 30G MISC Use as directed to monitor FSBS 3x daiy. Dx: E11.65 300 each 1   No current facility-administered medications on file prior to visit.   Allergies  Allergen Reactions  . Codeine Itching  . Lisinopril Swelling   Social History   Socioeconomic History  . Marital status: Divorced    Spouse name: Not on file  . Number of children: 2  . Years of education: Not on file  . Highest education level: Not on file  Occupational History  . Not on file  Tobacco Use  . Smoking status: Never Smoker  . Smokeless tobacco: Current User    Types: Snuff  Substance and Sexual Activity  . Alcohol use: No  . Drug use: No  . Sexual activity: Not on file  Other Topics Concern  . Not on file  Social History Narrative  . Not on file   Social Determinants of Health   Financial Resource Strain: Not on file  Food Insecurity: Not on file  Transportation Needs: Not on file  Physical Activity: Not on file  Stress: Not on file  Social Connections: Not on file  Intimate Partner Violence: Not on file     Review of Systems  All other systems reviewed and are negative.      Objective:   Physical Exam Vitals reviewed.  Constitutional:      Appearance: She is obese.  Cardiovascular:     Rate and Rhythm: Normal rate and regular rhythm.     Heart sounds: Normal heart sounds. No murmur heard. No friction rub. No gallop.   Pulmonary:     Effort: Pulmonary effort is normal. No respiratory distress.     Breath sounds: Normal breath sounds. No stridor. No wheezing, rhonchi or rales.  Chest:     Chest wall: No tenderness.   Musculoskeletal:     Right lower leg: No edema.     Left lower leg: No edema.  Neurological:     Mental Status: She is alert.           Assessment & Plan:  Controlled type 2 diabetes mellitus with complication, with long-term current use of insulin ([place])  I believe the patient sugars sound relatively well controlled despite stopping insulin.  I definitely think that we can manage her sugars moving forward with medication involving pills rather than  requiring insulin.  We discussed replacing the insulin with Jardiance, Januvia, or Trulicity.  We discussed the risk and benefits of each of these.  We also discussed glipizide.  After weighing the risk including cost, the patient elects to try Januvia 100 mg a day and then recheck an A1c in 3 months or notify me immediately if her sugars begin to run out of control.  Ideally I like her sugars to be between 102 100 on average

## 2021-03-01 ENCOUNTER — Telehealth: Payer: Self-pay | Admitting: Family Medicine

## 2021-03-01 NOTE — Telephone Encounter (Signed)
Call placed to patient to inquire.   Reports that FSBS this am noted at 198 prior to eating or taking any medication.   Patient reports that she is currently taking: Metformin 1000mg  PO BID Actos 30mg  PO Q AM Januvia 100mg  PO Q AM  States that she does have Novolin 70/30 on hand, but has not taken it since she started Januvia.   Reports that FSBS before lunch noted at 191. Reports that she had a salad and chicken wings for lunch.   Patient is concerned about elevated readings. Please advise.

## 2021-03-01 NOTE — Telephone Encounter (Signed)
Pt called stating that Dr. Rockey Situ her to call and let him know if her sugars were still running high, and she called to let us know that this morning her sugar was 198.   Please call: [phone]

## 2021-03-01 NOTE — Telephone Encounter (Signed)
PCP reviewed message and recommendations are as follows: Resume Novolin 70/30 15U SQ Q AM.  Call placed to patient and patient made aware. Agreeable to plan.

## 2021-03-08 NOTE — Progress Notes (Signed)
Subjective:   Amanda Riddle is a 86 y.o. female who presents for Medicare Annual (Subsequent) preventive examination.    Review of Systems    N/A  Cardiac Risk Factors include: advanced age (>53mn, >>92women);hypertension;diabetes mellitus;dyslipidemia;obesity (BMI >30kg/m2)     Objective:    Today's Vitals   03/09/21 0954  BP: 134/68  Pulse: (!) 104  Temp: 99.2 F (37.3 C)  TempSrc: Oral  SpO2: 97%  Weight: 221 lb (100.2 kg)  Height: '5\' 5"'  (1.651 m)   Body mass index is 36.78 kg/m.  Advanced Directives 03/09/2021 03/28/2016 01/17/2012  Does Patient Have a Medical Advance Directive? No No Patient does not have advance directive  Would patient like information on creating a medical advance directive? No - Patient declined No - patient declined information -  Pre-existing out of facility DNR order (yellow form or pink MOST form) - - No    Current Medications (verified) Outpatient Encounter Medications as of 03/09/2021  Medication Sig  . albuterol (VENTOLIN HFA) 108 (90 Base) MCG/ACT inhaler Inhale 2 puffs into the lungs every 6 (six) hours as needed. Shortness of breath  . amLODipine (NORVASC) 5 MG tablet Take 1 tablet (5 mg total) by mouth daily.  .Marland Kitchenaspirin EC 81 MG tablet Take 81 mg by mouth at bedtime.  .Marland Kitchenatorvastatin (LIPITOR) 40 MG tablet Take 1 tablet (40 mg total) by mouth daily.  . clotrimazole-betamethasone (LOTRISONE) cream Apply 1 application topically 2 (two) times daily.  . Cyanocobalamin (VITAMIN B 12 PO) Take 1 tablet by mouth at bedtime.   . Flaxseed, Linseed, (FLAXSEED OIL PO) Take 1 capsule by mouth 2 (two) times daily.   . Glucosamine Sulfate-MSM (MSM-GLUCOSAMINE PO) Take 1 capsule by mouth 2 (two) times daily.  .Marland Kitchenglucose blood (GNP TRUE METRIX GLUCOSE STRIPS) test strip Use as directed to monitor FSBS 3x daiy. Dx: E11.65  . hydrochlorothiazide (HYDRODIURIL) 25 MG tablet Take 1 tablet (25 mg total) by mouth daily.  . insulin isophane & regular human  (NOVOLIN 70/30 FLEXPEN) (70-30) 100 UNIT/ML KwikPen INJECT 30  UNITS SUBCUTANEOUSLY IN THE MORNING AND 15 UNITS SUBCUTANEOUSLY IN THE AFTERNOON (Patient taking differently: INJECT 30  UNITS SUBCUTANEOUSLY IN THE MORNING AND 15 UNITS SUBCUTANEOUSLY IN THE AFTERNOON)  . Insulin Syringe-Needle U-100 (B-D INS SYR ULTRAFINE .3CC/31G) 31G X 5/16" 0.3 ML MISC Use as directed to inject insulin SQ 2x daiy. Dx: E11.65  . metFORMIN (GLUCOPHAGE) 1000 MG tablet Take 1 tablet by mouth two times daily  . pioglitazone (ACTOS) 30 MG tablet Take 1 tablet (30 mg total) by mouth daily.  . sitaGLIPtin (JANUVIA) 100 MG tablet Take 1 tablet (100 mg total) by mouth daily.  .Marland Kitchentriamcinolone cream (KENALOG) 0.1 % Apply 1 application topically 2 (two) times daily.  . TRUEplus Lancets 30G MISC Use as directed to monitor FSBS 3x daiy. Dx: E11.65  . Blood Glucose Monitoring Suppl (TRUE METRIX METER) w/Device KIT   . meclizine (ANTIVERT) 12.5 MG tablet TAKE 1 TABLET BY MOUTH 3 TIMES DAILY AS NEEDED FOR DIZZINESS. (Patient not taking: Reported on 03/09/2021)   No facility-administered encounter medications on file as of 03/09/2021.    Allergies (verified) Codeine and Lisinopril   History: Past Medical History:  Diagnosis Date  . Diabetes mellitus   . Diverticulosis   . Hypercholesteremia   . Hypertension    Past Surgical History:  Procedure Laterality Date  . ABDOMINAL HYSTERECTOMY  1986  . TONSILLECTOMY  1950   Family History  Problem Relation  Age of Onset  . Colon cancer Neg Hx   . Breast cancer Neg Hx    Social History   Socioeconomic History  . Marital status: Divorced    Spouse name: Not on file  . Number of children: 2  . Years of education: Not on file  . Highest education level: Not on file  Occupational History  . Not on file  Tobacco Use  . Smoking status: Never Smoker  . Smokeless tobacco: Current User    Types: Snuff  Substance and Sexual Activity  . Alcohol use: No  . Drug use: No  .  Sexual activity: Not on file  Other Topics Concern  . Not on file  Social History Narrative  . Not on file   Social Determinants of Health   Financial Resource Strain: Low Risk   . Difficulty of Paying Living Expenses: Not very hard  Food Insecurity: No Food Insecurity  . Worried About Charity fundraiser in the Last Year: Never true  . Ran Out of Food in the Last Year: Never true  Transportation Needs: No Transportation Needs  . Lack of Transportation (Medical): No  . Lack of Transportation (Non-Medical): No  Physical Activity: Inactive  . Days of Exercise per Week: 0 days  . Minutes of Exercise per Session: 0 min  Stress: No Stress Concern Present  . Feeling of Stress : Not at all  Social Connections: Socially Isolated  . Frequency of Communication with Friends and Family: More than three times a week  . Frequency of Social Gatherings with Friends and Family: Once a week  . Attends Religious Services: Never  . Active Member of Clubs or Organizations: No  . Attends Archivist Meetings: Never  . Marital Status: Widowed    Tobacco Counseling Ready to quit: Not Answered Counseling given: Not Answered   Clinical Intake:  Pre-visit preparation completed: Yes  Pain : No/denies pain     Nutritional Risks: Nausea/ vomitting/ diarrhea (diarrhea) Diabetes: Yes CBG done?: No Did pt. bring in CBG monitor from home?: No  How often do you need to have someone help you when you read instructions, pamphlets, or other written materials from your doctor or pharmacy?: 3 - Sometimes  Diabetic?Yes Nutrition Risk Assessment:  Has the patient had any N/V/D within the last 2 months?  Yes  Does the patient have any non-healing wounds?  No  Has the patient had any unintentional weight loss or weight gain?  No   Diabetes:  Is the patient diabetic?  Yes  If diabetic, was a CBG obtained today?  No  Did the patient bring in their glucometer from home?  No  How often do you  monitor your CBG's? Patient states she checks glucose 3 times per day .   Financial Strains and Diabetes Management:  Are you having any financial strains with the device, your supplies or your medication? Yes .  Does the patient want to be seen by Chronic Care Management for management of their diabetes?  No  Would the patient like to be referred to a Nutritionist or for Diabetic Management?  No   Diabetic Exams:  Diabetic Eye Exam: Overdue for diabetic eye exam. Pt has been advised about the importance in completing this exam. Patient advised to call and schedule an eye exam. Diabetic Foot Exam: Completed 11/16/2020   Interpreter Needed?: No  Information entered by :: [place] of Daily Living In your present state of health, do you  have any difficulty performing the following activities: 03/09/2021 11/16/2020  Hearing? N N  Vision? N N  Difficulty concentrating or making decisions? Y N  Comment has some issues with short term memory loss -  Walking or climbing stairs? N N  Dressing or bathing? N N  Doing errands, shopping? N N  Preparing Food and eating ? N -  Using the Toilet? N -  In the past six months, have you accidently leaked urine? N -  Do you have problems with loss of bowel control? Y -  Managing your Medications? N -  Managing your Finances? N -  Housekeeping or managing your Housekeeping? N -  Some recent data might be hidden    Patient Care Team: Susy Frizzle, MD as PCP - General (Family Medicine)  Indicate any recent Medical Services you may have received from other than [facility] providers in the past year (date may be approximate).     Assessment:   This is a routine wellness examination for Amanda Riddle.  Hearing/Vision screen  Hearing Screening   '125Hz'  '250Hz'  '500Hz'  '1000Hz'  '2000Hz'  '3000Hz'  '4000Hz'  '6000Hz'  '8000Hz'   Right ear:           Left ear:           Vision Screening Comments: Patient states gets eyes examined once per year. Currently wears  glasses   Dietary issues and exercise activities discussed: Current Exercise Habits: The patient does not participate in regular exercise at present  Goals Addressed            This Visit's Progress   . HEMOGLOBIN A1C < 7      . Patient Stated       I would like to be baptized in [place] and I want to sky dive out of an airplane       Depression Screen PHQ 2/9 Scores 03/09/2021 10/25/2020 11/30/2019 11/23/2018 08/14/2018 08/07/2017 01/07/2017  PHQ - 2 Score 3 0 0 0 0 3 0  PHQ- 9 Score 11 - - - - 8 0    Fall Risk Fall Risk  03/09/2021 10/25/2020 11/30/2019 11/23/2018 08/14/2018  Falls in the past year? 1 0 0 0 No  Number falls in past yr: 0 0 - - -  Injury with Fall? 0 0 - - -  Risk for fall due to : History of fall(s) - - - -  Follow up Falls evaluation completed;Falls prevention discussed - Falls evaluation completed Falls evaluation completed -    FALL RISK PREVENTION PERTAINING TO THE HOME:  Any stairs in or around the home? No  If so, are there any without handrails? No  Home free of loose throw rugs in walkways, pet beds, electrical cords, etc? Yes  Adequate lighting in your home to reduce risk of falls? Yes   ASSISTIVE DEVICES UTILIZED TO PREVENT FALLS:  Life alert? No  Use of a cane, walker or w/c? No  Grab bars in the bathroom? No  Shower chair or bench in shower? No  Elevated toilet seat or a handicapped toilet? No   TIMED UP AND GO:  Was the test performed? Yes .  Length of time to ambulate 10 feet: 4 sec.   Gait steady and fast without use of assistive device  Cognitive Function:     6CIT Screen 03/09/2021  What Year? 0 points  What month? 0 points  What time? 0 points  Count back from 20 0 points  Months in reverse 0 points  Repeat  phrase 2 points  Total Score 2    Immunizations Immunization History  Administered Date(s) Administered  . Fluad Quad(high Dose 65+) 07/23/2019, 08/17/2020  . Influenza, High Dose Seasonal PF 08/15/2017, 08/14/2018   . Influenza,inj,Quad PF,6+ Mos 07/05/2013, 07/05/2014, 08/10/2015, 08/26/2016  . PFIZER(Purple Top)SARS-COV-2 Vaccination 04/14/2020, 05/05/2020  . Pneumococcal Conjugate-13 11/02/2013  . Pneumococcal Polysaccharide-23 05/09/2015    TDAP status: Due, Education has been provided regarding the importance of this vaccine. Advised may receive this vaccine at local pharmacy or Health Dept. Aware to provide a copy of the vaccination record if obtained from local pharmacy or Health Dept. Verbalized acceptance and understanding.  Flu Vaccine status: Up to date  Pneumococcal vaccine status: Up to date  Covid-19 vaccine status: Completed vaccines  Qualifies for Shingles Vaccine? Yes   Zostavax completed No   Shingrix Completed?: No.    Education has been provided regarding the importance of this vaccine. Patient has been advised to call insurance company to determine out of pocket expense if they have not yet received this vaccine. Advised may also receive vaccine at local pharmacy or Health Dept. Verbalized acceptance and understanding.  Screening Tests Health Maintenance  Topic Date Due  . TETANUS/TDAP  Never done  . OPHTHALMOLOGY EXAM  11/03/2019  . COVID-19 Vaccine (3 - Pfizer risk 4-dose series) 06/02/2020  . HEMOGLOBIN A1C  05/16/2021  . INFLUENZA VACCINE  05/21/2021  . URINE MICROALBUMIN  10/26/2021  . FOOT EXAM  11/16/2021  . DEXA SCAN  Completed  . PNA vac Low Risk Adult  Completed  . HPV VACCINES  Aged Out  . COLONOSCOPY (Pts 45-17yr Insurance coverage will need to be confirmed)  Discontinued    Health Maintenance  Health Maintenance Due  Topic Date Due  . TETANUS/TDAP  Never done  . OPHTHALMOLOGY EXAM  11/03/2019  . COVID-19 Vaccine (3 - Pfizer risk 4-dose series) 06/02/2020     Colorectal cancer screening: No longer required.   Mammogram status: Completed 07/19/2020. Repeat every year  Bone Density status: Ordered 03/09/2021. Pt provided with contact info and  advised to call to schedule appt.  Lung Cancer Screening: (Low Dose CT Chest recommended if Age 84-80years, 30 pack-year currently smoking OR have quit w/in 15years.) does not qualify.   Lung Cancer Screening Referral: N/A   Additional Screening:  Hepatitis C Screening: does not qualify;   Vision Screening: Recommended annual ophthalmology exams for early detection of glaucoma and other disorders of the eye. Is the patient up to date with their annual eye exam?  Yes  Who is the provider or what is the name of the office in which the patient attends annual eye exams? Dr. CJorja LoaIf pt is not established with a provider, would they like to be referred to a provider to establish care? No .   Dental Screening: Recommended annual dental exams for proper oral hygiene  Community Resource Referral / Chronic Care Management: CRR required this visit?  No   CCM required this visit?  Yes      Plan:     I have personally reviewed and noted the following in the patient's chart:   . Medical and social history . Use of alcohol, tobacco or illicit drugs  . Current medications and supplements including opioid prescriptions.  . Functional ability and status . Nutritional status . Physical activity . Advanced directives . List of other physicians . Hospitalizations, surgeries, and ER visits in previous 12 months . Vitals . Screenings to include cognitive, depression, and  falls . Referrals and appointments  In addition, I have reviewed and discussed with patient certain preventive protocols, quality metrics, and best practice recommendations. A written personalized care plan for preventive services as well as general preventive health recommendations were provided to patient.     Ofilia Neas, LPN   [id number]   Nurse Notes: None

## 2021-03-09 ENCOUNTER — Ambulatory Visit (INDEPENDENT_AMBULATORY_CARE_PROVIDER_SITE_OTHER): Payer: Medicare HMO

## 2021-03-09 ENCOUNTER — Other Ambulatory Visit: Payer: Self-pay

## 2021-03-09 VITALS — BP 134/68 | HR 104 | Temp 99.2°F | Ht 65.0 in | Wt 221.0 lb

## 2021-03-09 DIAGNOSIS — E118 Type 2 diabetes mellitus with unspecified complications: Secondary | ICD-10-CM

## 2021-03-09 DIAGNOSIS — Z794 Long term (current) use of insulin: Secondary | ICD-10-CM

## 2021-03-09 DIAGNOSIS — Z0000 Encounter for general adult medical examination without abnormal findings: Secondary | ICD-10-CM | POA: Diagnosis not present

## 2021-03-09 DIAGNOSIS — Z780 Asymptomatic menopausal state: Secondary | ICD-10-CM

## 2021-03-09 DIAGNOSIS — I10 Essential (primary) hypertension: Secondary | ICD-10-CM

## 2021-03-09 NOTE — Patient Instructions (Signed)
Amanda Riddle , Thank you for taking time to come for your Medicare Wellness Visit. I appreciate your ongoing commitment to your health goals. Please review the following plan we discussed and let me know if I can assist you in the future.   Screening recommendations/referrals: Colonoscopy: No longer required  Mammogram: Up to date, next due 07/19/2021 Bone Density: Currently due, orders placed this visit Recommended yearly ophthalmology/optometry visit for glaucoma screening and checkup Recommended yearly dental visit for hygiene and checkup  Vaccinations: Influenza vaccine: Up to date, next due fall 2022  Pneumococcal vaccine: Completed series  Tdap vaccine: Currently due, you may await injury to receive  Shingles vaccine: Currently due, if you would like to receive we recommend that you do so at your local pharmacy     Advanced directives: Advance directive discussed with you today. Even though you declined this today please call our office should you change your mind and we can give you the proper paperwork for you to fill out.   Conditions/risks identified: None   Next appointment: None    Preventive Care 65 Years and Older, Female Preventive care refers to lifestyle choices and visits with your health care provider that can promote health and wellness. What does preventive care include?  A yearly physical exam. This is also called an annual well check.  Dental exams once or twice a year.  Routine eye exams. Ask your health care provider how often you should have your eyes checked.  Personal lifestyle choices, including:  Daily care of your teeth and gums.  Regular physical activity.  Eating a healthy diet.  Avoiding tobacco and drug use.  Limiting alcohol use.  Practicing safe sex.  Taking low-dose aspirin every day.  Taking vitamin and mineral supplements as recommended by your health care provider. What happens during an annual well check? The services and  screenings done by your health care provider during your annual well check will depend on your age, overall health, lifestyle risk factors, and family history of disease. Counseling  Your health care provider may ask you questions about your:  Alcohol use.  Tobacco use.  Drug use.  Emotional well-being.  Home and relationship well-being.  Sexual activity.  Eating habits.  History of falls.  Memory and ability to understand (cognition).  Work and work Statistician.  Reproductive health. Screening  You may have the following tests or measurements:  Height, weight, and BMI.  Blood pressure.  Lipid and cholesterol levels. These may be checked every 5 years, or more frequently if you are over 32 years old.  Skin check.  Lung cancer screening. You may have this screening every year starting at age 2 if you have a 30-pack-year history of smoking and currently smoke or have quit within the past 15 years.  Fecal occult blood test (FOBT) of the stool. You may have this test every year starting at age 35.  Flexible sigmoidoscopy or colonoscopy. You may have a sigmoidoscopy every 5 years or a colonoscopy every 10 years starting at age 23.  Hepatitis C blood test.  Hepatitis B blood test.  Sexually transmitted disease (STD) testing.  Diabetes screening. This is done by checking your blood sugar (glucose) after you have not eaten for a while (fasting). You may have this done every 1-3 years.  Bone density scan. This is done to screen for osteoporosis. You may have this done starting at age 37.  Mammogram. This may be done every 1-2 years. Talk to your health care  provider about how often you should have regular mammograms. Talk with your health care provider about your test results, treatment options, and if necessary, the need for more tests. Vaccines  Your health care provider may recommend certain vaccines, such as:  Influenza vaccine. This is recommended every  year.  Tetanus, diphtheria, and acellular pertussis (Tdap, Td) vaccine. You may need a Td booster every 10 years.  Zoster vaccine. You may need this after age 45.  Pneumococcal 13-valent conjugate (PCV13) vaccine. One dose is recommended after age 38.  Pneumococcal polysaccharide (PPSV23) vaccine. One dose is recommended after age 43. Talk to your health care provider about which screenings and vaccines you need and how often you need them. This information is not intended to replace advice given to you by your health care provider. Make sure you discuss any questions you have with your health care provider. Document Released: 11/03/2015 Document Revised: 06/26/2016 Document Reviewed: 08/08/2015 Elsevier Interactive Patient Education  2017 [employer] Prevention in the Home Falls can cause injuries. They can happen to people of all ages. There are many things you can do to make your home safe and to help prevent falls. What can I do on the outside of my home?  Regularly fix the edges of walkways and driveways and fix any cracks.  Remove anything that might make you trip as you walk through a door, such as a raised step or threshold.  Trim any bushes or trees on the path to your home.  Use bright outdoor lighting.  Clear any walking paths of anything that might make someone trip, such as rocks or tools.  Regularly check to see if handrails are loose or broken. Make sure that both sides of any steps have handrails.  Any raised decks and porches should have guardrails on the edges.  Have any leaves, snow, or ice cleared regularly.  Use sand or salt on walking paths during winter.  Clean up any spills in your garage right away. This includes oil or grease spills. What can I do in the bathroom?  Use night lights.  Install grab bars by the toilet and in the tub and shower. Do not use towel bars as grab bars.  Use non-skid mats or decals in the tub or shower.  If you  need to sit down in the shower, use a plastic, non-slip stool.  Keep the floor dry. Clean up any water that spills on the floor as soon as it happens.  Remove soap buildup in the tub or shower regularly.  Attach bath mats securely with double-sided non-slip rug tape.  Do not have throw rugs and other things on the floor that can make you trip. What can I do in the bedroom?  Use night lights.  Make sure that you have a light by your bed that is easy to reach.  Do not use any sheets or blankets that are too big for your bed. They should not hang down onto the floor.  Have a firm chair that has side arms. You can use this for support while you get dressed.  Do not have throw rugs and other things on the floor that can make you trip. What can I do in the kitchen?  Clean up any spills right away.  Avoid walking on wet floors.  Keep items that you use a lot in easy-to-reach places.  If you need to reach something above you, use a strong step stool that has a grab bar.  Keep electrical cords out of the way.  Do not use floor polish or wax that makes floors slippery. If you must use wax, use non-skid floor wax.  Do not have throw rugs and other things on the floor that can make you trip. What can I do with my stairs?  Do not leave any items on the stairs.  Make sure that there are handrails on both sides of the stairs and use them. Fix handrails that are broken or loose. Make sure that handrails are as long as the stairways.  Check any carpeting to make sure that it is firmly attached to the stairs. Fix any carpet that is loose or worn.  Avoid having throw rugs at the top or bottom of the stairs. If you do have throw rugs, attach them to the floor with carpet tape.  Make sure that you have a light switch at the top of the stairs and the bottom of the stairs. If you do not have them, ask someone to add them for you. What else can I do to help prevent falls?  Wear shoes  that:  Do not have high heels.  Have rubber bottoms.  Are comfortable and fit you well.  Are closed at the toe. Do not wear sandals.  If you use a stepladder:  Make sure that it is fully opened. Do not climb a closed stepladder.  Make sure that both sides of the stepladder are locked into place.  Ask someone to hold it for you, if possible.  Clearly mark and make sure that you can see:  Any grab bars or handrails.  First and last steps.  Where the edge of each step is.  Use tools that help you move around (mobility aids) if they are needed. These include:  Canes.  Walkers.  Scooters.  Crutches.  Turn on the lights when you go into a dark area. Replace any light bulbs as soon as they burn out.  Set up your furniture so you have a clear path. Avoid moving your furniture around.  If any of your floors are uneven, fix them.  If there are any pets around you, be aware of where they are.  Review your medicines with your doctor. Some medicines can make you feel dizzy. This can increase your chance of falling. Ask your doctor what other things that you can do to help prevent falls. This information is not intended to replace advice given to you by your health care provider. Make sure you discuss any questions you have with your health care provider. Document Released: 08/03/2009 Document Revised: 03/14/2016 Document Reviewed: 11/11/2014 Elsevier Interactive Patient Education  2017 [employer].

## 2021-03-15 ENCOUNTER — Telehealth: Payer: Self-pay | Admitting: Family Medicine

## 2021-03-15 NOTE — Chronic Care Management (AMB) (Signed)
  Chronic Care Management   Note  03/15/2021 Name: REATA PETROV MRN: [id number] DOB: October 21, 1935  CLORIS FLIPPO is a 86 y.o. year old female who is a primary care patient of Dennard Schaumann, Cammie Mcgee, MD. I reached out to Ileene Hutchinson by phone today in response to a referral sent by Ms. Abimbola L Hudnall's PCP, Susy Frizzle, MD.   Ms. Lauman was given information about Chronic Care Management services today including:  1. CCM service includes personalized support from designated clinical staff supervised by her physician, including individualized plan of care and coordination with other care providers 2. 24/7 contact phone numbers for assistance for urgent and routine care needs. 3. Service will only be billed when office clinical staff spend 20 minutes or more in a month to coordinate care. 4. Only one practitioner may furnish and bill the service in a calendar month. 5. The patient may stop CCM services at any time (effective at the end of the month) by phone call to the office staff.   Patient agreed to services and verbal consent obtained.   Follow up plan:   Tatjana Secretary/administrator

## 2021-04-24 DIAGNOSIS — E119 Type 2 diabetes mellitus without complications: Secondary | ICD-10-CM | POA: Diagnosis not present

## 2021-04-24 LAB — HM DIABETES EYE EXAM

## 2021-04-30 ENCOUNTER — Ambulatory Visit (INDEPENDENT_AMBULATORY_CARE_PROVIDER_SITE_OTHER): Payer: Medicare HMO | Admitting: Pharmacist

## 2021-04-30 ENCOUNTER — Telehealth: Payer: Self-pay | Admitting: Pharmacist

## 2021-04-30 ENCOUNTER — Other Ambulatory Visit: Payer: Self-pay

## 2021-04-30 DIAGNOSIS — I10 Essential (primary) hypertension: Secondary | ICD-10-CM | POA: Diagnosis not present

## 2021-04-30 DIAGNOSIS — E782 Mixed hyperlipidemia: Secondary | ICD-10-CM

## 2021-04-30 DIAGNOSIS — E119 Type 2 diabetes mellitus without complications: Secondary | ICD-10-CM | POA: Diagnosis not present

## 2021-04-30 DIAGNOSIS — Z794 Long term (current) use of insulin: Secondary | ICD-10-CM

## 2021-04-30 NOTE — Progress Notes (Signed)
Chronic Care Management Pharmacy Note  04/30/2021 Name:  Amanda Riddle MRN:  [id number] DOB:  [birth year]  Summary: Initial visit with PharmD.  She reports low energy, occasional lower glucose reading and sleep disturbances.  Also reports high copay on Januvia, however she has still been taking this.  Recommendations/Changes made from today's visit: Chair exercises provided Recommend she increase her calorie intake with lean meats, etc to see if this helps her energy level. Patient assistance started for Januvia.  Plan: FU in 3 months with PharmD   Subjective: Amanda Riddle is an 86 y.o. year old female who is a primary patient of Pickard, Cammie Mcgee, MD.  The CCM team was consulted for assistance with disease management and care coordination needs.    Engaged with patient by telephone for initial visit in response to provider referral for pharmacy case management and/or care coordination services.   Consent to Services:  The patient was given the following information about Chronic Care Management services today, agreed to services, and gave verbal consent: 1. CCM service includes personalized support from designated clinical staff supervised by the primary care provider, including individualized plan of care and coordination with other care providers 2. 24/7 contact phone numbers for assistance for urgent and routine care needs. 3. Service will only be billed when office clinical staff spend 20 minutes or more in a month to coordinate care. 4. Only one practitioner may furnish and bill the service in a calendar month. 5.The patient may stop CCM services at any time (effective at the end of the month) by phone call to the office staff. 6. The patient will be responsible for cost sharing (co-pay) of up to 20% of the service fee (after annual deductible is met). Patient agreed to services and consent obtained.  Patient Care Team: Susy Frizzle, MD as PCP - General (Family  Medicine) [name], [facility] as Pharmacist (Pharmacist)  Recent office visits:  03/01/21 (Telephone) Dr. Dennard Schaumann Per note: Resume Novolin 70/30 15U SQ Q AM. 02/15/21 Dr. Dennard Schaumann For follow-up. STARTED Sitagliptin Phosphate 100 mg daily. 02/08/21 Dr. Dennard Schaumann For Bruising. Per note:  Dr. Dennard Schaumann recommended she stop her insulin and record her fasting blood sugars every morning and her 2-hour postprandial sugars every evening.  Report those values to me in 1 week.  If her morning sugars are between 101 130 and her 2-hour postprandial sugars are between 101 180, I will not start her back on insulin 02/07/21 (Telephone) Dr. Dennard Schaumann Per note: Decrease 70/30 to 20 in the am and 7 in the pm.   01/22/21 Eulogio Bear, NP. For follow-up. No medication changes. 11/16/20 Dr. Dennard Schaumann For follow-up. Per note: stop hydrochlorothiazide and increase amlodipine to 10 mg a day.   Recent consult visits:  None in the last six months   Hospital visits:  None in the last six months   Medication History: Januvia 100 mg 30 DS 02/15/21 Pioglitazone 30 mg 90 DS 02/06/21 Atorvastatin 50 mg 90 DS 02/02/21 Metformin 1000 mg 90 DS 01/16/21  Objective:  Lab Results  Component Value Date   CREATININE 1.04 (H) 02/08/2021   BUN 12 02/08/2021   GFRNONAA 50 (L) 02/08/2021   GFRAA 58 (L) 02/08/2021   NA 139 02/08/2021   K 3.7 02/08/2021   CALCIUM 9.1 02/08/2021   CO2 23 02/08/2021   GLUCOSE 151 (H) 02/08/2021    Lab Results  Component Value Date/Time   HGBA1C 7.2 (H) 11/16/2020 09:26 AM   HGBA1C 6.7 (  H) 08/17/2020 09:04 AM   MICROALBUR 1.4 10/26/2020 08:29 AM   MICROALBUR 0.4 07/23/2019 10:31 AM    Last diabetic Eye exam:  Lab Results  Component Value Date/Time   HMDIABEYEEXA No Retinopathy 11/02/2018 12:00 AM    Last diabetic Foot exam: No results found for: HMDIABFOOTEX   Lab Results  Component Value Date   CHOL 184 11/16/2020   HDL 74 11/16/2020   LDLCALC 87 11/16/2020   TRIG 132 11/16/2020    CHOLHDL 2.5 11/16/2020    Hepatic Function Latest Ref Rng & Units 02/08/2021 11/16/2020 08/17/2020  Total Protein 6.1 - 8.1 g/dL 6.3 6.4 6.5  Albumin 3.6 - 5.1 g/dL - - -  AST 10 - 35 U/L '13 16 19  ' ALT 6 - 29 U/L '7 13 10  ' Alk Phosphatase 33 - 130 U/L - - -  Total Bilirubin 0.2 - 1.2 mg/dL 0.8 1.0 0.9    No results found for: TSH, FREET4  CBC Latest Ref Rng & Units 02/08/2021 08/17/2020 11/30/2019  WBC 3.8 - 10.8 Thousand/uL 6.3 5.2 5.8  Hemoglobin 11.7 - 15.5 g/dL 12.3 11.6(L) 12.7  Hematocrit 35.0 - 45.0 % 38.6 36.3 38.9  Platelets 140 - 400 Thousand/uL 265 269 255    No results found for: VD25OH  Clinical ASCVD: No  The ASCVD Risk score Mikey Bussing DC Jr., et al., 2013) failed to calculate for the following reasons:   The 2013 ASCVD risk score is only valid for ages 51 to 63    Depression screen PHQ 2/9 03/09/2021 10/25/2020 11/30/2019  Decreased Interest 2 0 0  Down, Depressed, Hopeless 1 0 0  PHQ - 2 Score 3 0 0  Altered sleeping 3 - -  Tired, decreased energy 2 - -  Change in appetite 3 - -  Feeling bad or failure about yourself  0 - -  Trouble concentrating 0 - -  Moving slowly or fidgety/restless 0 - -  Suicidal thoughts 0 - -  PHQ-9 Score 11 - -  Difficult doing work/chores Somewhat difficult - -      Social History   Tobacco Use  Smoking Status Never  Smokeless Tobacco Current   Types: Snuff   BP Readings from Last 3 Encounters:  03/09/21 134/68  02/15/21 132/76  02/08/21 140/88   Pulse Readings from Last 3 Encounters:  03/09/21 (!) 104  02/15/21 90  02/08/21 80   Wt Readings from Last 3 Encounters:  03/09/21 221 lb (100.2 kg)  02/15/21 223 lb (101.2 kg)  02/08/21 224 lb (101.6 kg)   BMI Readings from Last 3 Encounters:  03/09/21 36.78 kg/m  02/15/21 37.11 kg/m  02/08/21 37.28 kg/m    Assessment/Interventions: Review of patient past medical history, allergies, medications, health status, including review of consultants reports, laboratory and other  test data, was performed as part of comprehensive evaluation and provision of chronic care management services.   SDOH:  (Social Determinants of Health) assessments and interventions performed: Yes  Financial Resource Strain: Low Risk    Difficulty of Paying Living Expenses: Not very hard    SDOH Screenings   Alcohol Screen: Low Risk    Last Alcohol Screening Score (AUDIT): 0  Depression (PHQ2-9): Medium Risk   PHQ-2 Score: 11  Financial Resource Strain: Low Risk    Difficulty of Paying Living Expenses: Not very hard  Food Insecurity: No Food Insecurity   Worried About Running Out of Food in the Last Year: Never true   Ran Out of Food in the  Last Year: Never true  Housing: Low Risk    Last Housing Risk Score: 0  Physical Activity: Inactive   Days of Exercise per Week: 0 days   Minutes of Exercise per Session: 0 min  Social Connections: Socially Isolated   Frequency of Communication with Friends and Family: More than three times a week   Frequency of Social Gatherings with Friends and Family: Once a week   Attends Religious Services: Never   Marine scientist or Organizations: No   Attends Archivist Meetings: Never   Marital Status: Widowed  Stress: No Stress Concern Present   Feeling of Stress : Not at all  Tobacco Use: High Risk   Smoking Tobacco Use: Never   Smokeless Tobacco Use: Current  Transportation Needs: No Transportation Needs   Lack of Transportation (Medical): No   Lack of Transportation (Non-Medical): No    CCM Care Plan  Allergies  Allergen Reactions   Codeine Itching   Lisinopril Swelling    Medications Reviewed Today     Reviewed by Launa Grill, LPN (Licensed Practical Nurse) on 03/09/21 at 1001  Med List Status: <None>   Medication Order Taking? Sig Documenting Provider Last Dose Status Informant  albuterol (VENTOLIN HFA) 108 (90 Base) MCG/ACT inhaler [id number] Yes Inhale 2 puffs into the lungs every 6 (six) hours as needed.  Shortness of breath Susy Frizzle, MD Taking Active   amLODipine (NORVASC) 5 MG tablet [id number] Yes Take 1 tablet (5 mg total) by mouth daily. Susy Frizzle, MD Taking Active   aspirin EC 81 MG tablet [id number] Yes Take 81 mg by mouth at bedtime. [provider] Taking Active Self  atorvastatin (LIPITOR) 40 MG tablet [id number] Yes Take 1 tablet (40 mg total) by mouth daily. Susy Frizzle, MD Taking Active   clotrimazole-betamethasone Donalynn Furlong) cream [id number] Yes Apply 1 application topically 2 (two) times daily. Susy Frizzle, MD Taking Active   Cyanocobalamin (VITAMIN B 12 PO) [id number] Yes Take 1 tablet by mouth at bedtime.  [provider] Taking Active Self  Flaxseed, Linseed, (FLAXSEED OIL PO) [id number] Yes Take 1 capsule by mouth 2 (two) times daily.  [provider] Taking Active Self  Glucosamine Sulfate-MSM (MSM-GLUCOSAMINE PO) [id number] Yes Take 1 capsule by mouth 2 (two) times daily. [provider] Taking Active Self  glucose blood (GNP TRUE METRIX GLUCOSE STRIPS) test strip [id number] Yes Use as directed to monitor FSBS 3x daiy. Dx: E11.65 Eulogio Bear, NP Taking Active   hydrochlorothiazide (HYDRODIURIL) 25 MG tablet [id number] Yes Take 1 tablet (25 mg total) by mouth daily. Susy Frizzle, MD Taking Active   insulin isophane & regular human (NOVOLIN 70/30 FLEXPEN) (70-30) 100 UNIT/ML KwikPen [id number] Yes INJECT 30  UNITS SUBCUTANEOUSLY IN THE MORNING AND 15 UNITS SUBCUTANEOUSLY IN THE AFTERNOON  Patient taking differently: INJECT 30  UNITS SUBCUTANEOUSLY IN THE MORNING AND 15 UNITS SUBCUTANEOUSLY IN THE AFTERNOON   Susy Frizzle, MD Taking Active   Insulin Syringe-Needle U-100 (B-D INS SYR ULTRAFINE .3CC/31G) 31G X 5/16" 0.3 ML MISC [id number] Yes Use as directed to inject insulin SQ 2x daiy. Dx: E11.65 Eulogio Bear, NP Taking Active   meclizine (ANTIVERT) 12.5 MG tablet [id number] No TAKE 1 TABLET BY MOUTH 3 TIMES  DAILY AS NEEDED FOR DIZZINESS.  Patient not taking: Reported on 03/09/2021   Susy Frizzle, MD Not Taking Active   metFORMIN (GLUCOPHAGE) 1000 MG tablet [id number] Yes Take 1 tablet by mouth two  times daily Susy Frizzle, MD Taking Active   pioglitazone (ACTOS) 30 MG tablet [id number] Yes Take 1 tablet (30 mg total) by mouth daily. Susy Frizzle, MD Taking Active   sitaGLIPtin (JANUVIA) 100 MG tablet [id number] Yes Take 1 tablet (100 mg total) by mouth daily. Susy Frizzle, MD Taking Active   triamcinolone cream (KENALOG) 0.1 % [id number] Yes Apply 1 application topically 2 (two) times daily. Susy Frizzle, MD Taking Active   TRUEplus Lancets 30G MISC [id number] Yes Use as directed to monitor FSBS 3x daiy. Dx: E11.65 Eulogio Bear, NP Taking Active             Patient Active Problem List   Diagnosis Date Noted   Great toe pain, right 10/25/2020   Pain in joint, shoulder region 07/06/2013   Muscle spasms of neck 07/06/2013   Hyperglycemia 01/17/2012   HTN (hypertension) 01/17/2012   Hyperlipidemia 01/17/2012   DM (diabetes mellitus) ([place]) 01/17/2012    Immunization History  Administered Date(s) Administered   Fluad Quad(high Dose 65+) 07/23/2019, 08/17/2020   Influenza, High Dose Seasonal PF 08/15/2017, 08/14/2018   Influenza,inj,Quad PF,6+ Mos 07/05/2013, 07/05/2014, 08/10/2015, 08/26/2016   PFIZER(Purple Top)SARS-COV-2 Vaccination 04/14/2020, 05/05/2020   Pneumococcal Conjugate-13 11/02/2013   Pneumococcal Polysaccharide-23 05/09/2015    Conditions to be addressed/monitored:  HTN, DM, HLD?  Care Plan : General Pharmacy (Adult)  Updates made by [name], RPH since 04/30/2021 12:00 AM     Problem: HTN, HLD, DM   Priority: High  Onset Date: 04/30/2021     Long-Range Goal: Patient-Specific Goal   Start Date: 04/30/2021  Expected End Date: 10/31/2021  This Visit's Progress: On track  Priority: High  Note:   Current Barriers:  Unable to  independently afford treatment regimen Unable to independently monitor therapeutic efficacy Unable to achieve control of glucose.   Pharmacist Clinical Goal(s):  Patient will verbalize ability to afford treatment regimen achieve control of glucose as evidenced by monitoring/A1c adhere to prescribed medication regimen as evidenced by fill dates through collaboration with PharmD and provider.   Interventions: 1:1 collaboration with Susy Frizzle, MD regarding development and update of comprehensive plan of care as evidenced by provider attestation and co-signature Inter-disciplinary care team collaboration (see longitudinal plan of care) Comprehensive medication review performed; medication list updated in electronic medical record  Hypertension (BP goal <140/90) -Controlled -Current treatment: Amlodipine 52m daily HCTZ 268mdaily -Medications previously tried: valsartan  -Current home readings: not checking -Current dietary habits: patient reports her calorie intake has not been what it used to be.  She likes to snack all day.   -Current exercise habits: minimal/none.  Walks outside only when family comes to visit. -Denies hypotensive/hypertensive symptoms -Educated on BP goals and benefits of medications for prevention of heart attack, stroke and kidney damage; Exercise goal of 150 minutes per week; Importance of home blood pressure monitoring; Symptoms of hypotension and importance of maintaining adequate hydration; -Counseled to monitor BP at home whenever able, document, and provide log at future appointments -Counseled on diet and exercise extensively Recommended to continue current medication Recommended implement some exercise plan, work her way up to 30 minutes 5 times per week.  Will provide chair exercises for her to do so that she does not have to walk outside alone.  Hyperlipidemia: (LDL goal < 70) -Not ideally controlled -Current treatment: Atorvastatin  4081mMedications previously tried: none noted  -Current dietary patterns: see above -Current exercise habits: minimal -Educated on Cholesterol goals;  Benefits of statin for ASCVD risk reduction; Importance of limiting foods high in cholesterol; -Recommended to continue current medication Recommended she switch to night time dosing to see if this will help bring down LDL any more.  Wrote it on her bottle to take at night.  Diabetes (A1c goal <7%) -Not ideally controlled -Current medications: Januvia 145m daily Metformin 5040mBID Insulin NPH 70-30 15 units qam Pioglitazone 3015maily -Medications previously tried: none noted  -Current home glucose readings glucose: did not have her logs, reports it has been in the 70s once recently but this was near bed time.  Does not seem as if she checks consistently  -Reports hypoglycemic/hyperglycemic symptoms - sometimes has heart beating fast and wakes up at night.   -Current meal patterns:  Reports appetite has been down lately.  Not eating much besides salads, fruit, unless she is at a family get together. -Current exercise: minimal -Educated on A1c and blood sugar goals; Complications of diabetes including kidney damage, retinal damage, and cardiovascular disease; Prevention and management of hypoglycemic episodes; Benefits of routine self-monitoring of blood sugar; -Counseled to check feet daily and get yearly eye exams -Counseled on diet and exercise extensively Recommended to continue current medication Assessed patient finances. She reports high copay on Januvia.  Based on her reported income she will be a candidate for [employer] PAP program.   Application provided, patient to get income verification and return completed application.   Patient Goals/Self-Care Activities Patient will:  - take medications as prescribed focus on medication adherence by pill box check glucose at least once daily (would prefer twice), document, and  provide at future appointments target a minimum of 150 minutes of moderate intensity exercise weekly  Follow Up Plan: The care management team will reach out to the patient again over the next 90 days.           Medication Assistance: Application for Januvia  medication assistance program. in process.  Anticipated assistance start date unknown.  See plan of care for additional detail.  Compliance/Adherence/Medication fill history: Care Gaps: Eye exam needed  Star-Rating Drugs: Januvia 100 mg 30 DS 02/15/21 Pioglitazone 30 mg 90 DS 02/06/21 Atorvastatin 50 mg 90 DS 02/02/21 Metformin 1000 mg 90 DS 01/16/21  [facility]  [id number]: [phone]: [phone] [id number]: [phone]: [phone] pill box? Yes Pt endorses 100% compliance  We discussed: Current pharmacy is preferred with insurance plan and patient is satisfied with pharmacy services Patient decided to: Continue current medication management strategy  Care Plan and Follow Up Patient Decision:  Patient agrees to Care Plan and Follow-up.  Plan: The care management team will reach out to the patient again over the next 90 days.  ChriBeverly MilcharmD Clinical Pharmacist [phone]

## 2021-04-30 NOTE — Patient Instructions (Signed)
Visit Information   Goals Addressed   None    There are no care plans to display for this patient.   Amanda Riddle was given information about Chronic Care Management services today including:  CCM service includes personalized support from designated clinical staff supervised by her physician, including individualized plan of care and coordination with other care providers 24/7 contact phone numbers for assistance for urgent and routine care needs. Standard insurance, coinsurance, copays and deductibles apply for chronic care management only during months in which we provide at least 20 minutes of these services. Most insurances cover these services at 100%, however patients may be responsible for any copay, coinsurance and/or deductible if applicable. This service may help you avoid the need for more expensive face-to-face services. Only one practitioner may furnish and bill the service in a calendar month. The patient may stop CCM services at any time (effective at the end of the month) by phone call to the office staff.  Patient agreed to services and verbal consent obtained.   The patient verbalized understanding of instructions, educational materials, and care plan provided today and agreed to receive a mailed copy of patient instructions, educational materials, and care plan.  Telephone follow up appointment with pharmacy team member scheduled for: 3 months  [name], [place]

## 2021-04-30 NOTE — Progress Notes (Addendum)
Chronic Care Management Pharmacy Assistant   Name: Amanda Riddle  MRN: [id number] DOB: [birth year]  Amanda Riddle is an 86 y.o. year old female who presents for his initial CCM visit with the clinical pharmacist.  Reason for Encounter: Chart Prep   Conditions to be addressed/monitored: HTN, DM, HLD, Hyperglycemia.   Primary concerns for visit include: HTN, DM.  Recent office visits:  03/01/21 (Telephone) Dr. Dennard Schaumann Per note: Resume Novolin 70/30 15U SQ Q AM. 02/15/21 Dr. Dennard Schaumann For follow-up. STARTED Sitagliptin Phosphate 100 mg daily. 02/08/21 Dr. Dennard Schaumann For Bruising. Per note:  Dr. Dennard Schaumann recommended she stop her insulin and record her fasting blood sugars every morning and her 2-hour postprandial sugars every evening.  Report those values to me in 1 week.  If her morning sugars are between 101 130 and her 2-hour postprandial sugars are between 101 180, I will not start her back on insulin 02/07/21 (Telephone) Dr. Dennard Schaumann Per note: Decrease 70/30 to 20 in the am and 7 in the pm.   01/22/21 Eulogio Bear, NP. For follow-up. No medication changes.  11/16/20 Dr. Dennard Schaumann For follow-up. Per note: stop hydrochlorothiazide and increase amlodipine to 10 mg a day.  Recent consult visits:  None in the last six months  Hospital visits:  None in the last six months  Medication History: Januvia 100 mg 30 DS 02/15/21 Pioglitazone 30 mg 90 DS 02/06/21 Atorvastatin 50 mg 90 DS 02/02/21 Metformin 1000 mg 90 DS 01/16/21  Medications: Outpatient Encounter Medications as of 04/30/2021  Medication Sig   albuterol (VENTOLIN HFA) 108 (90 Base) MCG/ACT inhaler Inhale 2 puffs into the lungs every 6 (six) hours as needed. Shortness of breath   amLODipine (NORVASC) 5 MG tablet Take 1 tablet (5 mg total) by mouth daily.   aspirin EC 81 MG tablet Take 81 mg by mouth at bedtime.   atorvastatin (LIPITOR) 40 MG tablet Take 1 tablet (40 mg total) by mouth daily.   Blood Glucose Monitoring Suppl (TRUE  METRIX METER) w/Device KIT    clotrimazole-betamethasone (LOTRISONE) cream Apply 1 application topically 2 (two) times daily.   Cyanocobalamin (VITAMIN B 12 PO) Take 1 tablet by mouth at bedtime.    Flaxseed, Linseed, (FLAXSEED OIL PO) Take 1 capsule by mouth 2 (two) times daily.    Glucosamine Sulfate-MSM (MSM-GLUCOSAMINE PO) Take 1 capsule by mouth 2 (two) times daily.   glucose blood (GNP TRUE METRIX GLUCOSE STRIPS) test strip Use as directed to monitor FSBS 3x daiy. Dx: E11.65   hydrochlorothiazide (HYDRODIURIL) 25 MG tablet Take 1 tablet (25 mg total) by mouth daily.   insulin isophane & regular human (NOVOLIN 70/30 FLEXPEN) (70-30) 100 UNIT/ML KwikPen INJECT 30  UNITS SUBCUTANEOUSLY IN THE MORNING AND 15 UNITS SUBCUTANEOUSLY IN THE AFTERNOON (Patient taking differently: INJECT 30  UNITS SUBCUTANEOUSLY IN THE MORNING AND 15 UNITS SUBCUTANEOUSLY IN THE AFTERNOON)   Insulin Syringe-Needle U-100 (B-D INS SYR ULTRAFINE .3CC/31G) 31G X 5/16" 0.3 ML MISC Use as directed to inject insulin SQ 2x daiy. Dx: E11.65   meclizine (ANTIVERT) 12.5 MG tablet TAKE 1 TABLET BY MOUTH 3 TIMES DAILY AS NEEDED FOR DIZZINESS. (Patient not taking: Reported on 03/09/2021)   metFORMIN (GLUCOPHAGE) 1000 MG tablet Take 1 tablet by mouth two times daily   pioglitazone (ACTOS) 30 MG tablet Take 1 tablet (30 mg total) by mouth daily.   sitaGLIPtin (JANUVIA) 100 MG tablet Take 1 tablet (100 mg total) by mouth daily.   triamcinolone cream (KENALOG) 0.1 %  Apply 1 application topically 2 (two) times daily.   TRUEplus Lancets 30G MISC Use as directed to monitor FSBS 3x daiy. Dx: E11.65   No facility-administered encounter medications on file as of 04/30/2021.   Have you seen any other providers since your last visit? Patient stated no.  Any changes in your medications or health? Patient stated no.  Any side effects from any medications? Patient stated no.  Do you have an symptoms or problems not managed by your medications?  Patient stated no.  Any concerns about your health right now? Patient stated no.  Has your provider asked that you check blood pressure, blood sugar, or follow special diet at home? Patient stated she monitors her blood sugar sometimes and watches what she eats.  Do you get any type of exercise on a regular basis? Patient stated she does night leg exercises.   Can you think of a goal you would like to reach for your health? Patient stated she would like to have more energy.   Do you have any problems getting your medications? Patient stated her Celesta Gentile is expensive. $45 for 30 days.  Is there anything that you would like to discuss during the appointment? Patient stated no.  Please bring medications and supplements to appointment, patient reminded of her face to face appointment on 04/30/21 at 11 am   [place], [place] Pharmacist Assistant [phone]

## 2021-05-17 ENCOUNTER — Ambulatory Visit (INDEPENDENT_AMBULATORY_CARE_PROVIDER_SITE_OTHER): Payer: Medicare HMO | Admitting: Family Medicine

## 2021-05-17 ENCOUNTER — Other Ambulatory Visit: Payer: Self-pay

## 2021-05-17 ENCOUNTER — Encounter: Payer: Self-pay | Admitting: Family Medicine

## 2021-05-17 ENCOUNTER — Telehealth: Payer: Self-pay | Admitting: Pharmacist

## 2021-05-17 VITALS — BP 128/70 | HR 86 | Temp 98.2°F | Resp 16 | Ht 65.0 in | Wt 213.0 lb

## 2021-05-17 DIAGNOSIS — L608 Other nail disorders: Secondary | ICD-10-CM

## 2021-05-17 DIAGNOSIS — Z794 Long term (current) use of insulin: Secondary | ICD-10-CM

## 2021-05-17 DIAGNOSIS — E119 Type 2 diabetes mellitus without complications: Secondary | ICD-10-CM | POA: Diagnosis not present

## 2021-05-17 NOTE — Progress Notes (Signed)
Subjective:    Patient ID: Amanda Riddle, female    DOB: [birth year], 86 y.o.   MRN: [id number] Patient is here today for a follow-up on her diabetes.  She is now in the donut hole.  As result, her Januvia is going to cost more than $400!  This is not affordable to her.  She is currently on Actos and metformin and taking insulin 15 units in the morning.  Despite only taking the insulin once a day and it not being a 24-hour insulin, her blood sugars are still relatively well controlled.  Her fasting blood sugars typically are between 110 and 150.  Random sugars are also 150.  She denies any hypoglycemic episodes.  She denies any polyuria polydipsia or blurry vision Past Medical History:  Diagnosis Date   Diabetes mellitus    Diverticulosis    Hypercholesteremia    Hypertension    Past Surgical History:  Procedure Laterality Date   ABDOMINAL HYSTERECTOMY  1986   TONSILLECTOMY  1950   Current Outpatient Medications on File Prior to Visit  Medication Sig Dispense Refill   albuterol (VENTOLIN HFA) 108 (90 Base) MCG/ACT inhaler Inhale 2 puffs into the lungs every 6 (six) hours as needed. Shortness of breath 3 each 2   amLODipine (NORVASC) 5 MG tablet Take 1 tablet (5 mg total) by mouth daily. 90 tablet 3   aspirin EC 81 MG tablet Take 81 mg by mouth at bedtime.     atorvastatin (LIPITOR) 40 MG tablet Take 1 tablet (40 mg total) by mouth daily. 90 tablet 2   Blood Glucose Monitoring Suppl (TRUE METRIX METER) w/Device KIT      clotrimazole-betamethasone (LOTRISONE) cream Apply 1 application topically 2 (two) times daily. 30 g 0   Cyanocobalamin (VITAMIN B 12 PO) Take 1 tablet by mouth at bedtime.      Flaxseed, Linseed, (FLAXSEED OIL PO) Take 1 capsule by mouth 2 (two) times daily.      Glucosamine Sulfate-MSM (MSM-GLUCOSAMINE PO) Take 1 capsule by mouth 2 (two) times daily.     glucose blood (GNP TRUE METRIX GLUCOSE STRIPS) test strip Use as directed to monitor FSBS 3x daiy. Dx: E11.65 300 each  1   hydrochlorothiazide (HYDRODIURIL) 25 MG tablet Take 1 tablet (25 mg total) by mouth daily. 90 tablet 3   insulin isophane & regular human (NOVOLIN 70/30 FLEXPEN) (70-30) 100 UNIT/ML KwikPen INJECT 30  UNITS SUBCUTANEOUSLY IN THE MORNING AND 15 UNITS SUBCUTANEOUSLY IN THE AFTERNOON (Patient taking differently: INJECT 30  UNITS SUBCUTANEOUSLY IN THE MORNING AND 15 UNITS SUBCUTANEOUSLY IN THE AFTERNOON) 15 mL 11   Insulin Syringe-Needle U-100 (B-D INS SYR ULTRAFINE .3CC/31G) 31G X 5/16" 0.3 ML MISC Use as directed to inject insulin SQ 2x daiy. Dx: E11.65 300 each 3   meclizine (ANTIVERT) 12.5 MG tablet TAKE 1 TABLET BY MOUTH 3 TIMES DAILY AS NEEDED FOR DIZZINESS. 40 tablet 0   metFORMIN (GLUCOPHAGE) 1000 MG tablet Take 1 tablet by mouth two times daily 180 tablet 1   pioglitazone (ACTOS) 30 MG tablet Take 1 tablet (30 mg total) by mouth daily. 90 tablet 3   sitaGLIPtin (JANUVIA) 100 MG tablet Take 1 tablet (100 mg total) by mouth daily. (Patient not taking: Reported on 05/17/2021) 30 tablet 6   triamcinolone cream (KENALOG) 0.1 % Apply 1 application topically 2 (two) times daily. 30 g 0   TRUEplus Lancets 30G MISC Use as directed to monitor FSBS 3x daiy. Dx: E11.65 300 each 1  No current facility-administered medications on file prior to visit.   Allergies  Allergen Reactions   Codeine Itching   Lisinopril Swelling   Social History   Socioeconomic History   Marital status: Divorced    Spouse name: Not on file   Number of children: 2   Years of education: Not on file   Highest education level: Not on file  Occupational History   Not on file  Tobacco Use   Smoking status: Never   Smokeless tobacco: Current    Types: Snuff  Substance and Sexual Activity   Alcohol use: No   Drug use: No   Sexual activity: Not on file  Other Topics Concern   Not on file  Social History Narrative   Not on file   Social Determinants of Health   Financial Resource Strain: Low Risk    Difficulty  of Paying Living Expenses: Not very hard  Food Insecurity: No Food Insecurity   Worried About Running Out of Food in the Last Year: Never true   Ran Out of Food in the Last Year: Never true  Transportation Needs: No Transportation Needs   Lack of Transportation (Medical): No   Lack of Transportation (Non-Medical): No  Physical Activity: Inactive   Days of Exercise per Week: 0 days   Minutes of Exercise per Session: 0 min  Stress: No Stress Concern Present   Feeling of Stress : Not at all  Social Connections: Socially Isolated   Frequency of Communication with Friends and Family: More than three times a week   Frequency of Social Gatherings with Friends and Family: Once a week   Attends Religious Services: Never   Marine scientist or Organizations: No   Attends Archivist Meetings: Never   Marital Status: Widowed  Human resources officer Violence: Not At Risk   Fear of Current or Ex-Partner: No   Emotionally Abused: No   Physically Abused: No   Sexually Abused: No     Review of Systems  All other systems reviewed and are negative.     Objective:   Physical Exam Vitals reviewed.  Constitutional:      Appearance: She is obese.  Cardiovascular:     Rate and Rhythm: Normal rate and regular rhythm.     Heart sounds: Normal heart sounds. No murmur heard.   No friction rub. No gallop.  Pulmonary:     Effort: Pulmonary effort is normal. No respiratory distress.     Breath sounds: Normal breath sounds. No stridor. No wheezing, rhonchi or rales.  Chest:     Chest wall: No tenderness.  Musculoskeletal:     Right lower leg: No edema.     Left lower leg: No edema.  Neurological:     Mental Status: She is alert.          Assessment & Plan:  Type 2 diabetes mellitus without complication, with long-term current use of insulin (HCC) - Plan: COMPLETE METABOLIC PANEL WITH GFR, Hemoglobin A1c, Microalbumin, urine  Acquired dysmorphic toenail - Plan: Ambulatory referral  to Podiatry Blood pressure today is excellent.  I will check a CMP A1c and microalbumin.  Patient can stop Januvia.  Of asked her to check her sugars twice a day over the next few weeks and report the values to me in 2 to 3 weeks.  My plan is to uptitrate her insulin as necessary to accommodate for stopping the Januvia.  Diabetic foot exam was performed today and is significant for dysmorphic  toenails on both feet that require trimming.  She would like to see a podiatrist for this

## 2021-05-17 NOTE — Progress Notes (Addendum)
Chronic Care Management Pharmacy Assistant   Name: Amanda Riddle  MRN: [id number] DOB: 05-28-1935  Reason for Encounter: Adherence Review  Medications: Outpatient Encounter Medications as of 05/17/2021  Medication Sig   albuterol (VENTOLIN HFA) 108 (90 Base) MCG/ACT inhaler Inhale 2 puffs into the lungs every 6 (six) hours as needed. Shortness of breath   amLODipine (NORVASC) 5 MG tablet Take 1 tablet (5 mg total) by mouth daily.   aspirin EC 81 MG tablet Take 81 mg by mouth at bedtime.   atorvastatin (LIPITOR) 40 MG tablet Take 1 tablet (40 mg total) by mouth daily.   Blood Glucose Monitoring Suppl (TRUE METRIX METER) w/Device KIT    clotrimazole-betamethasone (LOTRISONE) cream Apply 1 application topically 2 (two) times daily.   Cyanocobalamin (VITAMIN B 12 PO) Take 1 tablet by mouth at bedtime.    Flaxseed, Linseed, (FLAXSEED OIL PO) Take 1 capsule by mouth 2 (two) times daily.    Glucosamine Sulfate-MSM (MSM-GLUCOSAMINE PO) Take 1 capsule by mouth 2 (two) times daily.   glucose blood (GNP TRUE METRIX GLUCOSE STRIPS) test strip Use as directed to monitor FSBS 3x daiy. Dx: E11.65   hydrochlorothiazide (HYDRODIURIL) 25 MG tablet Take 1 tablet (25 mg total) by mouth daily.   insulin isophane & regular human (NOVOLIN 70/30 FLEXPEN) (70-30) 100 UNIT/ML KwikPen INJECT 30  UNITS SUBCUTANEOUSLY IN THE MORNING AND 15 UNITS SUBCUTANEOUSLY IN THE AFTERNOON (Patient taking differently: INJECT 15 UNITS SUBCUTANEOUSLY IN THE MORNING)   Insulin Syringe-Needle U-100 (B-D INS SYR ULTRAFINE .3CC/31G) 31G X 5/16" 0.3 ML MISC Use as directed to inject insulin SQ 2x daiy. Dx: E11.65   meclizine (ANTIVERT) 12.5 MG tablet TAKE 1 TABLET BY MOUTH 3 TIMES DAILY AS NEEDED FOR DIZZINESS.   metFORMIN (GLUCOPHAGE) 1000 MG tablet Take 1 tablet by mouth two times daily   pioglitazone (ACTOS) 30 MG tablet Take 1 tablet (30 mg total) by mouth daily.   sitaGLIPtin (JANUVIA) 100 MG tablet Take 1 tablet (100 mg total) by  mouth daily. (Patient not taking: Reported on 05/17/2021)   triamcinolone cream (KENALOG) 0.1 % Apply 1 application topically 2 (two) times daily.   TRUEplus Lancets 30G MISC Use as directed to monitor FSBS 3x daiy. Dx: E11.65   No facility-administered encounter medications on file as of 05/17/2021.   Reviewed the patients chart for any medical/health and/or medication changes:  05/17/21 Dr. Dennard Schaumann For follow-up. Per note: Patient can STOP Januvia.  Follow-Up:Pharmacist Review  Charlann Lange, [place] Pharmacist Assistant [phone]

## 2021-05-18 ENCOUNTER — Encounter: Payer: Self-pay | Admitting: *Deleted

## 2021-05-18 ENCOUNTER — Other Ambulatory Visit: Payer: Self-pay | Admitting: *Deleted

## 2021-05-18 LAB — COMPLETE METABOLIC PANEL WITH GFR
AG Ratio: 1.6 (calc) (ref 1.0–2.5)
ALT: 11 U/L (ref 6–29)
AST: 17 U/L (ref 10–35)
Albumin: 4.1 g/dL (ref 3.6–5.1)
Alkaline phosphatase (APISO): 66 U/L (ref 37–153)
BUN/Creatinine Ratio: 10 (calc) (ref 6–22)
BUN: 11 mg/dL (ref 7–25)
CO2: 23 mmol/L (ref 20–32)
Calcium: 9.2 mg/dL (ref 8.6–10.4)
Chloride: 104 mmol/L (ref 98–110)
Creat: 1.11 mg/dL — ABNORMAL HIGH (ref 0.60–0.95)
Globulin: 2.6 g/dL (calc) (ref 1.9–3.7)
Glucose, Bld: 86 mg/dL (ref 65–99)
Potassium: 3.9 mmol/L (ref 3.5–5.3)
Sodium: 140 mmol/L (ref 135–146)
Total Bilirubin: 1.2 mg/dL (ref 0.2–1.2)
Total Protein: 6.7 g/dL (ref 6.1–8.1)
eGFR: 49 mL/min/{1.73_m2} — ABNORMAL LOW (ref >=60)

## 2021-05-18 LAB — MICROALBUMIN, URINE: Microalb, Ur: 0.4 mg/dL

## 2021-05-18 LAB — HEMOGLOBIN A1C
Hgb A1c MFr Bld: 6.4 % of total Hgb — ABNORMAL HIGH (ref <5.7)
Mean Plasma Glucose: 137 mg/dL
eAG (mmol/L): 7.6 mmol/L

## 2021-05-29 ENCOUNTER — Encounter: Payer: Self-pay | Admitting: Podiatry

## 2021-05-29 ENCOUNTER — Other Ambulatory Visit: Payer: Self-pay

## 2021-05-29 ENCOUNTER — Ambulatory Visit: Payer: Medicare HMO | Admitting: Podiatry

## 2021-05-29 DIAGNOSIS — M79675 Pain in left toe(s): Secondary | ICD-10-CM

## 2021-05-29 DIAGNOSIS — M79674 Pain in right toe(s): Secondary | ICD-10-CM

## 2021-05-29 DIAGNOSIS — Z794 Long term (current) use of insulin: Secondary | ICD-10-CM

## 2021-05-29 DIAGNOSIS — E119 Type 2 diabetes mellitus without complications: Secondary | ICD-10-CM

## 2021-05-29 DIAGNOSIS — B351 Tinea unguium: Secondary | ICD-10-CM | POA: Diagnosis not present

## 2021-05-29 NOTE — Progress Notes (Signed)
This patient presents to the office with chief complaint of long thick nails and diabetic feet.  This patient  says there  is  no pain and discomfort in their feet.  This patient says there are long thick painful nails.  These nails are painful walking and wearing shoes.  Patient has no history of infection or drainage from both feet.  Patient is unable to  self treat his own nails . This patient presents  to the office today for treatment of the  long nails and a foot evaluation due to history of  diabetes.  General Appearance  Alert, conversant and in no acute stress.  Vascular  Dorsalis pedis and posterior tibial  pulses are palpable  bilaterally.  Capillary return is within normal limits  bilaterally. Temperature is within normal limits  bilaterally.  Neurologic  Senn-Weinstein monofilament wire test within normal limits  bilaterally. Muscle power within normal limits bilaterally.  Nails Thick disfigured discolored nails with subungual debris  from hallux to fifth toes bilaterally. No evidence of bacterial infection or drainage bilaterally.  Orthopedic  No limitations of motion of motion feet .  No crepitus or effusions noted.  No bony pathology or digital deformities noted.  Skin  normotropic skin with no porokeratosis noted bilaterally.  No signs of infections or ulcers noted.     Onychomycosis  Diabetes with no foot complications  IE  Debride nails  with nail nipper followed by dremel tool. A diabetic foot exam was performed and there is no evidence of any vascular or neurologic pathology.   RTC 3 months.   Gardiner Barefoot DPM

## 2021-06-05 ENCOUNTER — Other Ambulatory Visit: Payer: Self-pay | Admitting: Family Medicine

## 2021-06-15 ENCOUNTER — Other Ambulatory Visit: Payer: Self-pay | Admitting: Nurse Practitioner

## 2021-06-15 ENCOUNTER — Other Ambulatory Visit: Payer: Self-pay | Admitting: Family Medicine

## 2021-06-15 DIAGNOSIS — Z1231 Encounter for screening mammogram for malignant neoplasm of breast: Secondary | ICD-10-CM

## 2021-06-27 ENCOUNTER — Telehealth: Payer: Self-pay | Admitting: Family Medicine

## 2021-06-27 ENCOUNTER — Emergency Department (HOSPITAL_COMMUNITY): Payer: Medicare HMO

## 2021-06-27 ENCOUNTER — Encounter (HOSPITAL_COMMUNITY): Payer: Self-pay | Admitting: Emergency Medicine

## 2021-06-27 ENCOUNTER — Ambulatory Visit (HOSPITAL_COMMUNITY)
Admission: EM | Admit: 2021-06-27 | Discharge: 2021-06-27 | Disposition: A | Discharge status: Home / Self Care | Payer: Medicare HMO

## 2021-06-27 ENCOUNTER — Emergency Department (HOSPITAL_COMMUNITY)
Admission: EM | Admit: 2021-06-27 | Discharge: 2021-06-27 | Disposition: A | Discharge status: Home / Self Care | Payer: Medicare HMO | Attending: Emergency Medicine | Admitting: Emergency Medicine

## 2021-06-27 ENCOUNTER — Other Ambulatory Visit: Payer: Self-pay

## 2021-06-27 DIAGNOSIS — R079 Chest pain, unspecified: Secondary | ICD-10-CM

## 2021-06-27 DIAGNOSIS — R0789 Other chest pain: Secondary | ICD-10-CM | POA: Diagnosis not present

## 2021-06-27 DIAGNOSIS — Z7984 Long term (current) use of oral hypoglycemic drugs: Secondary | ICD-10-CM | POA: Diagnosis not present

## 2021-06-27 DIAGNOSIS — Z7982 Long term (current) use of aspirin: Secondary | ICD-10-CM | POA: Diagnosis not present

## 2021-06-27 DIAGNOSIS — U071 COVID-19: Secondary | ICD-10-CM | POA: Diagnosis not present

## 2021-06-27 DIAGNOSIS — E119 Type 2 diabetes mellitus without complications: Secondary | ICD-10-CM | POA: Diagnosis not present

## 2021-06-27 DIAGNOSIS — F17220 Nicotine dependence, chewing tobacco, uncomplicated: Secondary | ICD-10-CM | POA: Diagnosis not present

## 2021-06-27 DIAGNOSIS — Z79899 Other long term (current) drug therapy: Secondary | ICD-10-CM | POA: Insufficient documentation

## 2021-06-27 DIAGNOSIS — I10 Essential (primary) hypertension: Secondary | ICD-10-CM | POA: Diagnosis not present

## 2021-06-27 DIAGNOSIS — Z794 Long term (current) use of insulin: Secondary | ICD-10-CM | POA: Insufficient documentation

## 2021-06-27 LAB — BASIC METABOLIC PANEL
Anion gap: 9 (ref 5–15)
BUN: 11 mg/dL (ref 8–23)
CO2: 24 mmol/L (ref 22–32)
Calcium: 9 mg/dL (ref 8.9–10.3)
Chloride: 107 mmol/L (ref 98–111)
Creatinine, Ser: 1.05 mg/dL — ABNORMAL HIGH (ref 0.44–1.00)
GFR, Estimated: 52 mL/min — ABNORMAL LOW (ref >60)
Glucose, Bld: 114 mg/dL — ABNORMAL HIGH (ref 70–99)
Potassium: 3.7 mmol/L (ref 3.5–5.1)
Sodium: 140 mmol/L (ref 135–145)

## 2021-06-27 LAB — CBC
HCT: 35.5 % — ABNORMAL LOW (ref 36.0–46.0)
Hemoglobin: 11.7 g/dL — ABNORMAL LOW (ref 12.0–15.0)
MCH: 29.9 pg (ref 26.0–34.0)
MCHC: 33 g/dL (ref 30.0–36.0)
MCV: 90.8 fL (ref 80.0–100.0)
Platelets: 245 10*3/uL (ref 150–400)
RBC: 3.91 MIL/uL (ref 3.87–5.11)
RDW: 15.2 % (ref 11.5–15.5)
WBC: 5.9 10*3/uL (ref 4.0–10.5)
nRBC: 0 % (ref 0.0–0.2)

## 2021-06-27 LAB — TROPONIN I (HIGH SENSITIVITY)
Troponin I (High Sensitivity): 4 ng/L (ref <18)
Troponin I (High Sensitivity): 4 ng/L (ref <18)

## 2021-06-27 LAB — RESP PANEL BY RT-PCR (FLU A&B, COVID) ARPGX2
Influenza A by PCR: NEGATIVE
Influenza B by PCR: NEGATIVE
SARS Coronavirus 2 by RT PCR: POSITIVE — AB

## 2021-06-27 MED ORDER — NIRMATRELVIR/RITONAVIR (PAXLOVID)TABLET: 3.0000 | ORAL_TABLET | Freq: Two times a day (BID) | ORAL | 0 refills | Status: AC

## 2021-06-27 NOTE — ED Provider Notes (Signed)
Emergency Medicine Provider Triage Evaluation Note  Amanda Riddle , a 86 y.o. female  was evaluated in triage.  Pt complains of chest pain  Review of Systems  Positive: cough Negative: Fever   Physical Exam  BP (!) 122/96 (BP Location: Left Arm)   Pulse 84   Resp 16   SpO2 100%  Gen:   Awake, no distress   Resp:  Normal effort  MSK:   Moves extremities without difficulty  Other:    Medical Decision Making  Medically screening exam initiated at 10:34 AM.  Appropriate orders placed.  Amanda Riddle was informed that the remainder of the evaluation will be completed by another provider, this initial triage assessment does not replace that evaluation, and the importance of remaining in the ED until their evaluation is complete.     Sidney Ace 06/27/21 1036    Carmin Muskrat, MD 06/27/21 1446

## 2021-06-27 NOTE — ED Notes (Signed)
Malachi Paradise daughter [phone] would like an update

## 2021-06-27 NOTE — Telephone Encounter (Signed)
Patient noted to have gone to UC, and then transferred to ED.

## 2021-06-27 NOTE — ED Notes (Signed)
Amanda Riddle daughter [phone] would like an update on the patient

## 2021-06-27 NOTE — ED Triage Notes (Signed)
Pt has bad cough that gotten worse today. Reports epigastric pains for 2 days that is intermittent.

## 2021-06-27 NOTE — Discharge Instructions (Addendum)
You have been prescribed Paxlovid to help treat COVID-19. Please hold Lipitor for 5 days. Please hold Amlodipine for 5 days.  Resume these medications after finishing 5 days of Paxlovid  You have tested positive for COVID-19. Please stay home for at least 5 days from symptom onset. Isolate from others in your home. You are likely most infectious during these first 5 days.  You may end isolation after day 5 if you are fever free for 24 hours AND your symptoms are improving. Please continue to wear a mask in public until day 10.   If you have?moderate illness?(if you experienced shortness of breath or had difficulty breathing), or?severe illness?(you were hospitalized) due to COVID-19, or you have a weakened immune system, you need to isolate through day 10.  Please return if you develop difficulty breathing, chest pain, or worsening symptoms after day 5, please return to the emergency department.  Please schedule an appointment with a cardiologist regarding the chest pain that you had.  This may have been related to your COVID-19 diagnosis, but you need to make sure that it was not caused by your heart. Cardiologist information should be in your discharge information.

## 2021-06-27 NOTE — ED Notes (Signed)
Per Vladimir Faster, NP, pt's daughter will be taking patient POV to ED for further evaluation due to age and c/o chest pains with abnormal EKG.

## 2021-06-27 NOTE — Telephone Encounter (Signed)
Patient called in this morning requesting an appointment. She states that it felt as if she had bricks on her chest and hard to breath. I advised patient she needed to go to ER or urgent care I offered to call 911 for patient she declined said that she would get someone to take her to either ER or Urgent care.   CB# [phone]

## 2021-06-27 NOTE — ED Provider Notes (Signed)
[facility] DEPARTMENT Provider Note   CSN: [id number] Arrival date & time: 06/27/21  1021     History No chief complaint on file.   Amanda Riddle is a 86 y.o. female.  Past medical history of hypertension, hyperlipidemia, diabetes.  Patient had an episode of chest pain about 2 nights ago.  She described it as feeling like a brick in the her chest while she was sleeping.  She took Tylenol and it went away.  She says she has had on and off flares similar to the first episode for the past 2 days.  She denies any current chest pain.  She developed a cough this morning.  Cough is nonproductive with no noted blood. she denies any congestion, sore throat, rhinorrhea, shortness of breath, abdominal pain, nausea, vomiting, constipation, changes in urination, fever, chills.  HPI  HPI: A 86 year old patient with a history of treated diabetes, hypertension and hypercholesterolemia presents for evaluation of chest pain. Initial onset of pain was more than 6 hours ago. The patient's chest pain is described as heaviness/pressure/tightness and is not worse with exertion. The patient's chest pain is not middle- or left-sided, is not well-localized, is not sharp and does not radiate to the arms/jaw/neck. The patient does not complain of nausea and denies diaphoresis. The patient has no history of stroke, has no history of peripheral artery disease, has not smoked in the past 90 days, has no relevant family history of coronary artery disease (first degree relative at less than age 75) and does not have an elevated BMI (>=30).   Past Medical History:  Diagnosis Date   Diabetes mellitus    Diverticulosis    Hypercholesteremia    Hypertension     Patient Active Problem List   Diagnosis Date Noted   Pain due to onychomycosis of toenails of both feet 05/29/2021   Great toe pain, right 10/25/2020   Pain in joint, shoulder region 07/06/2013   Muscle spasms of neck 07/06/2013    Hyperglycemia 01/17/2012   HTN (hypertension) 01/17/2012   Hyperlipidemia 01/17/2012   DM (diabetes mellitus) ([place]) 01/17/2012    Past Surgical History:  Procedure Laterality Date   [place]     OB History   No obstetric history on file.     Family History  Problem Relation Age of Onset   Colon cancer Neg Hx    Breast cancer Neg Hx     Social History   Tobacco Use   Smoking status: Never   Smokeless tobacco: Current    Types: Snuff  Substance Use Topics   Alcohol use: No   Drug use: No    Home Medications Prior to Admission medications   Medication Sig Start Date End Date Taking? Authorizing Provider  nirmatrelvir/ritonavir EUA (PAXLOVID) 20 x 150 MG & 10 x 100MG TABS Take 3 tablets by mouth 2 (two) times daily for 5 days. Patient GFR is 52. Creat 1.05 Take nirmatrelvir (150 mg) two tablets twice daily for 5 days and ritonavir (100 mg) one tablet twice daily for 5 days. 06/27/21 07/02/21 Yes Orvile Corona, Adora Fridge, PA-C  albuterol (VENTOLIN HFA) 108 (90 Base) MCG/ACT inhaler Inhale 2 puffs into the lungs every 6 (six) hours as needed. Shortness of breath 11/16/20   Susy Frizzle, MD  amLODipine (NORVASC) 5 MG tablet Take 1 tablet (5 mg total) by mouth daily. 01/26/21   Susy Frizzle, MD  aspirin EC 81 MG tablet  Take 81 mg by mouth at bedtime.    [provider]  atorvastatin (LIPITOR) 40 MG tablet Take 1 tablet (40 mg total) by mouth daily. 11/16/20   Susy Frizzle, MD  Blood Glucose Monitoring Suppl (TRUE METRIX METER) w/Device KIT  01/29/21   [provider]  clotrimazole-betamethasone (LOTRISONE) cream Apply 1 application topically 2 (two) times daily. 11/30/19   Susy Frizzle, MD  Cyanocobalamin (VITAMIN B 12 PO) Take 1 tablet by mouth at bedtime.     [provider]  Flaxseed, Linseed, (FLAXSEED OIL PO) Take 1 capsule by mouth 2 (two) times daily.     [provider]  Glucosamine  Sulfate-MSM (MSM-GLUCOSAMINE PO) Take 1 capsule by mouth 2 (two) times daily.    [provider]  hydrochlorothiazide (HYDRODIURIL) 25 MG tablet Take 1 tablet (25 mg total) by mouth daily. 01/26/21   Susy Frizzle, MD  insulin isophane & regular human (NOVOLIN 70/30 FLEXPEN) (70-30) 100 UNIT/ML KwikPen INJECT 30  UNITS SUBCUTANEOUSLY IN THE MORNING AND 15 UNITS SUBCUTANEOUSLY IN THE AFTERNOON Patient taking differently: INJECT 15 UNITS SUBCUTANEOUSLY IN THE MORNING 11/16/20   Susy Frizzle, MD  Insulin Syringe-Needle U-100 (B-D INS SYR ULTRAFINE .3CC/31G) 31G X 5/16" 0.3 ML MISC Use as directed to inject insulin SQ 2x daiy. Dx: E11.65 01/26/21   Eulogio Bear, NP  meclizine (ANTIVERT) 12.5 MG tablet TAKE 1 TABLET BY MOUTH 3 TIMES DAILY AS NEEDED FOR DIZZINESS. 08/07/17   Susy Frizzle, MD  metFORMIN (GLUCOPHAGE) 1000 MG tablet TAKE 1 TABLET TWICE DAILY 06/06/21   Susy Frizzle, MD  pioglitazone (ACTOS) 30 MG tablet Take 1 tablet (30 mg total) by mouth daily. 01/26/21   Susy Frizzle, MD  triamcinolone cream (KENALOG) 0.1 % Apply 1 application topically 2 (two) times daily. 08/17/20   Susy Frizzle, MD  TRUE METRIX BLOOD GLUCOSE TEST test strip TEST BLOOD SUGAR THREE TIMES DAILY AS DIRECTED 06/15/21   Susy Frizzle, MD  TRUEplus Lancets 30G MISC TEST BLOOD SUGAR THREE TIMES DAILY AS DIRECTED 06/15/21   Susy Frizzle, MD    Allergies    Codeine and Lisinopril  Review of Systems   Review of Systems  Constitutional:  Negative for chills and fever.  HENT:  Negative for congestion and rhinorrhea.   Eyes:  Negative for visual disturbance.  Respiratory:  Positive for cough. Negative for chest tightness and shortness of breath.   Cardiovascular:  Positive for chest pain. Negative for palpitations and leg swelling.  Gastrointestinal:  Positive for diarrhea. Negative for abdominal pain, constipation, nausea and vomiting.  Genitourinary:  Negative for decreased urine  volume and difficulty urinating.  Musculoskeletal:  Negative for back pain.  Skin:  Negative for rash and wound.  Neurological:  Negative for dizziness, syncope, weakness, light-headedness and headaches.  All other systems reviewed and are negative.  Physical Exam Updated Vital Signs BP (!) 154/70   Pulse 84   Temp 99 F (37.2 C) (Oral)   Resp 15   SpO2 98%   Physical Exam Vitals and nursing note reviewed.  Constitutional:      General: She is not in acute distress.    Appearance: Normal appearance. She is not ill-appearing, toxic-appearing or diaphoretic.  HENT:     Head: Normocephalic and atraumatic.     Mouth/Throat:     Mouth: Mucous membranes are moist.     Pharynx: Oropharynx is clear. No oropharyngeal exudate or posterior oropharyngeal erythema.  Eyes:  General: No scleral icterus.       Right eye: No discharge.        Left eye: No discharge.     Conjunctiva/sclera: Conjunctivae normal.  Cardiovascular:     Rate and Rhythm: Normal rate and regular rhythm.     Pulses: Normal pulses.     Heart sounds: Normal heart sounds, S1 normal and S2 normal. No murmur heard.   No friction rub. No gallop.  Pulmonary:     Effort: Pulmonary effort is normal. No respiratory distress.     Breath sounds: Normal breath sounds. No wheezing, rhonchi or rales.  Abdominal:     General: Abdomen is flat. There is no distension.     Palpations: Abdomen is soft. There is no pulsatile mass.     Tenderness: There is no abdominal tenderness. There is no guarding or rebound.  Musculoskeletal:     Right lower leg: No edema.     Left lower leg: No edema.  Skin:    General: Skin is warm and dry.     Coloration: Skin is not jaundiced.     Findings: No bruising, erythema, lesion or rash.  Neurological:     General: No focal deficit present.     Mental Status: She is alert and oriented to person, place, and time.  Psychiatric:        Mood and Affect: Mood normal.        Behavior: Behavior  normal.    ED Results / Procedures / Treatments   Labs (all labs ordered are listed, but only abnormal results are displayed) Labs Reviewed  RESP PANEL BY RT-PCR (FLU A&B, COVID) ARPGX2 - Abnormal; Notable for the following components:      Result Value   SARS Coronavirus 2 by RT PCR POSITIVE (*)    All other components within normal limits  BASIC METABOLIC PANEL - Abnormal; Notable for the following components:   Glucose, Bld 114 (*)    Creatinine, Ser 1.05 (*)    GFR, Estimated 52 (*)    All other components within normal limits  CBC - Abnormal; Notable for the following components:   Hemoglobin 11.7 (*)    HCT 35.5 (*)    All other components within normal limits  TROPONIN I (HIGH SENSITIVITY)  TROPONIN I (HIGH SENSITIVITY)    EKG EKG Interpretation  Date/Time:  Wednesday June 27 2021 10:25:52 EDT Ventricular Rate:  80 PR Interval:  144 QRS Duration: 80 QT Interval:  358 QTC Calculation: 412 R Axis:   56 Text Interpretation: Normal sinus rhythm Nonspecific T wave abnormality Abnormal ECG Confirmed by Octaviano Glow [phone]) on 06/27/2021 3:07:32 PM  Radiology DG Chest 2 View  Result Date: 06/27/2021 CLINICAL DATA:  Chest pain EXAM: CHEST - 2 VIEW COMPARISON:  Chest radiograph 03/28/2016 FINDINGS: The cardiomediastinal silhouette is normal. The lungs clear, with no focal consolidation or pulmonary edema. There is no pleural effusion or pneumothorax. There is no acute osseous abnormality. IMPRESSION: No radiographic evidence of acute cardiopulmonary process. Electronically Signed   By: Valetta Mole M.D.   On: 06/27/2021 11:16    Procedures Procedures   Medications Ordered in ED Medications - No data to display  ED Course  I have reviewed the triage vital signs and the nursing notes.  Pertinent labs & imaging results that were available during my care of the patient were reviewed by me and considered in my medical decision making (see chart for  details).  Clinical Course as of 06/27/21  1953  Wed Jun 27, 2021  1545 86 yo female presenting with chest pain in bed 2 nights ago, intermittent since then.  No active chest pain.  New cough today.  No prior cardiac history per oral report or medical record review. [MT]  [place] is negative.  Patient is COVID-positive.  This was discussed with her daughter on the phone.  I do think she is eligible for paxlovid - instructed to hold statin and amlodipine (small dose 5 mg) for 5 days while taking this medication, and otherwise okay for discharge home.  She is not hypoxic and she is breathing comfortably. [MT]    Clinical Course User Index [MT] Trifan, Carola Rhine, MD   MDM Rules/Calculators/A&P HEAR Score: 5                       Patient is a fairly healthy 86 year old female presented with chest pain that started 2 days ago.  Is been intermittent and is atypical in nature.  Vitals are all stable.  Physical exam with clear lungs auscultation.  Patient does have to cough today.  Overall cardiac work-up has been largely unremarkable.  Ordered COVID and flu testing due to new onset cough this morning.   Heart score 5.  Discussed results with patient and she preferred to go home then be admitted.  Will obtain second troponin if normal she will have close outpatient follow-up follow-up with cardiology.  COVID results positive.  Patient updated.  She will be given a prescription for Paxlovid to take outpatient.  She needs to hold her Lipitor and amlodipine for 5 days while she is taking this medication.  I will call her daughter and update her.  Second troponin was obtained and normal.  Patient's vitals are stable.  She is comfortable having no chest pain.  Patient's chest pain could be attributed to COVID, cardiac cause cannot be excluded.  This was discussed with the patient and her family.  We will send patient home with referral to cardiology outpatient.  Final Clinical Impression(s) / ED  Diagnoses Final diagnoses:  COVID-19  Chest pain, unspecified type    Rx / DC Orders ED Discharge Orders          Ordered    nirmatrelvir/ritonavir EUA (PAXLOVID) 20 x 150 MG & 10 x 100MG TABS  2 times daily       Note to Pharmacy: Patient home medications reviewed.   06/27/21 1853             Adolphus Birchwood, PA-C 06/27/21 1955    Wyvonnia Dusky, MD 06/28/21 [phone]

## 2021-06-27 NOTE — ED Provider Notes (Signed)
[name]    CSN: [id number] Arrival date & time: 06/27/21  9811      History   Chief Complaint Chief Complaint  Patient presents with   Cough   Chest Pain    HPI Amanda Riddle is a 86 y.o. female with multiple medical problems including DM2, hypertension, high cholesterol presented to urgent care today with complaints of chest pain.  Patient evaluated briefly after triage to determine appropriateness for urgent care setting.  Patient reports onset of midsternal chest pain over the weekend.  States pain has been intermittent in and nonradiating.  She denies any associated palpitations, shortness of breath or diaphoresis.  Patient states she developed cough this morning.  Patient states she feels like she has "a ton of bricks sitting on chest".  Daughter at side during exam.    Past Medical History:  Diagnosis Date   Diabetes mellitus    Diverticulosis    Hypercholesteremia    Hypertension     Patient Active Problem List   Diagnosis Date Noted   Pain due to onychomycosis of toenails of both feet 05/29/2021   Great toe pain, right 10/25/2020   Pain in joint, shoulder region 07/06/2013   Muscle spasms of neck 07/06/2013   Hyperglycemia 01/17/2012   HTN (hypertension) 01/17/2012   Hyperlipidemia 01/17/2012   DM (diabetes mellitus) ([place]) 01/17/2012    Past Surgical History:  Procedure Laterality Date   [place]    OB History   No obstetric history on file.      Home Medications    Prior to Admission medications   Medication Sig Start Date End Date Taking? Authorizing Provider  albuterol (VENTOLIN HFA) 108 (90 Base) MCG/ACT inhaler Inhale 2 puffs into the lungs every 6 (six) hours as needed. Shortness of breath 11/16/20   Susy Frizzle, MD  amLODipine (NORVASC) 5 MG tablet Take 1 tablet (5 mg total) by mouth daily. 01/26/21   Susy Frizzle, MD  aspirin EC 81 MG tablet Take 81 mg by mouth at bedtime.     [provider]  atorvastatin (LIPITOR) 40 MG tablet Take 1 tablet (40 mg total) by mouth daily. 11/16/20   Susy Frizzle, MD  Blood Glucose Monitoring Suppl (TRUE METRIX METER) w/Device KIT  01/29/21   [provider]  clotrimazole-betamethasone (LOTRISONE) cream Apply 1 application topically 2 (two) times daily. 11/30/19   Susy Frizzle, MD  Cyanocobalamin (VITAMIN B 12 PO) Take 1 tablet by mouth at bedtime.     [provider]  Flaxseed, Linseed, (FLAXSEED OIL PO) Take 1 capsule by mouth 2 (two) times daily.     [provider]  Glucosamine Sulfate-MSM (MSM-GLUCOSAMINE PO) Take 1 capsule by mouth 2 (two) times daily.    [provider]  hydrochlorothiazide (HYDRODIURIL) 25 MG tablet Take 1 tablet (25 mg total) by mouth daily. 01/26/21   Susy Frizzle, MD  insulin isophane & regular human (NOVOLIN 70/30 FLEXPEN) (70-30) 100 UNIT/ML KwikPen INJECT 30  UNITS SUBCUTANEOUSLY IN THE MORNING AND 15 UNITS SUBCUTANEOUSLY IN THE AFTERNOON Patient taking differently: INJECT 15 UNITS SUBCUTANEOUSLY IN THE MORNING 11/16/20   Susy Frizzle, MD  Insulin Syringe-Needle U-100 (B-D INS SYR ULTRAFINE .3CC/31G) 31G X 5/16" 0.3 ML MISC Use as directed to inject insulin SQ 2x daiy. Dx: E11.65 01/26/21   Eulogio Bear, NP  meclizine (ANTIVERT) 12.5 MG tablet TAKE 1 TABLET BY MOUTH 3 TIMES DAILY AS  NEEDED FOR DIZZINESS. 08/07/17   Susy Frizzle, MD  metFORMIN (GLUCOPHAGE) 1000 MG tablet TAKE 1 TABLET TWICE DAILY 06/06/21   Susy Frizzle, MD  pioglitazone (ACTOS) 30 MG tablet Take 1 tablet (30 mg total) by mouth daily. 01/26/21   Susy Frizzle, MD  triamcinolone cream (KENALOG) 0.1 % Apply 1 application topically 2 (two) times daily. 08/17/20   Susy Frizzle, MD  TRUE METRIX BLOOD GLUCOSE TEST test strip TEST BLOOD SUGAR THREE TIMES DAILY AS DIRECTED 06/15/21   Susy Frizzle, MD  TRUEplus Lancets 30G MISC TEST BLOOD SUGAR THREE TIMES DAILY AS  DIRECTED 06/15/21   Susy Frizzle, MD    Family History Family History  Problem Relation Age of Onset   Colon cancer Neg Hx    Breast cancer Neg Hx     Social History Social History   Tobacco Use   Smoking status: Never   Smokeless tobacco: Current    Types: Snuff  Substance Use Topics   Alcohol use: No   Drug use: No     Allergies   Codeine and Lisinopril   Review of Systems As stated in HPI otherwise negative   Physical Exam Triage Vital Signs ED Triage Vitals  Enc Vitals Group     BP 06/27/21 0944 (!) 156/83     Pulse Rate 06/27/21 0944 92     Resp 06/27/21 0944 (!) 24     Temp 06/27/21 0944 99.2 F (37.3 C)     Temp Source 06/27/21 0944 Oral     SpO2 06/27/21 0944 98 %     Weight --      Height --      Head Circumference --      Peak Flow --      Pain Score 06/27/21 0946 8     Pain Loc --      Pain Edu? --      Excl. in [place]? --    No data found.  Updated Vital Signs BP (!) 156/83 (BP Location: Right Arm)   Pulse 92   Temp 99.2 F (37.3 C) (Oral)   Resp (!) 24   SpO2 98%   Visual Acuity Right Eye Distance:   Left Eye Distance:   Bilateral Distance:    Right Eye Near:   Left Eye Near:    Bilateral Near:     Physical Exam Constitutional:      General: She is not in acute distress.    Appearance: She is well-developed. She is not ill-appearing or toxic-appearing.  Cardiovascular:     Rate and Rhythm: Normal rate and regular rhythm.     Heart sounds: Normal heart sounds.  Pulmonary:     Effort: Pulmonary effort is normal. No tachypnea or respiratory distress.     Breath sounds: Normal breath sounds.  Chest:     Chest wall: No tenderness.  Abdominal:     Palpations: Abdomen is soft.  Musculoskeletal:     Right lower leg: No edema.  Skin:    General: Skin is warm and dry.  Neurological:     General: No focal deficit present.     Mental Status: She is alert.  Psychiatric:        Mood and Affect: Mood normal.        Behavior:  Behavior normal.     UC Treatments / Results  Labs (all labs ordered are listed, but only abnormal results are displayed) Labs Reviewed - No data to  display  EKG: NSR 87bpm, with possible mild T wave inversion in V5, V6.  Only EKG available for comparison was from 2017 which did not show this  Radiology DG Chest 2 View  Result Date: 06/27/2021 CLINICAL DATA:  Chest pain EXAM: CHEST - 2 VIEW COMPARISON:  Chest radiograph 03/28/2016 FINDINGS: The cardiomediastinal silhouette is normal. The lungs clear, with no focal consolidation or pulmonary edema. There is no pleural effusion or pneumothorax. There is no acute osseous abnormality. IMPRESSION: No radiographic evidence of acute cardiopulmonary process. Electronically Signed   By: Valetta Mole M.D.   On: 06/27/2021 11:16    Procedures Procedures (including critical care time)  Medications Ordered in UC Medications - No data to display  Initial Impression / Assessment and Plan / UC Course  I have reviewed the triage vital signs and the nursing notes.  Pertinent labs & imaging results that were available during my care of the patient were reviewed by me and considered in my medical decision making (see chart for details).  Chest pain -Patient high risk given history of diabetes, hypertension and high cholesterol.  She does endorse cough however this began a few days after intermittent chest pain over the weekend -EKG with some nonspecific changes. though the last EKG for comparison in 2017 -Patient to go to the ED for further evaluation chest pain and rule out cardiac etiology.  Daughter prefers to take patient herself and declines EMS transport, which is a reasonable request as VSS and nonacute ekg  Reviewed expections re: course of current medical issues. Questions answered. Outlined signs and symptoms indicating need for more acute intervention. Pt verbalized understanding.  Final Clinical Impressions(s) / UC Diagnoses   Final  diagnoses:  Chest pain, unspecified type   Discharge Instructions   None    ED Prescriptions   None    PDMP not reviewed this encounter.   Rudolpho Sevin, NP 06/27/21 1429

## 2021-06-28 ENCOUNTER — Other Ambulatory Visit: Payer: Self-pay | Admitting: Family Medicine

## 2021-06-28 ENCOUNTER — Telehealth: Payer: Self-pay | Admitting: Family Medicine

## 2021-06-28 MED ORDER — HYDROCODONE BIT-HOMATROP MBR 5-1.5 MG/5ML PO SOLN: 5.0000 mL | Freq: Three times a day (TID) | ORAL | 0 refills | Status: DC | PRN

## 2021-06-28 NOTE — Telephone Encounter (Signed)
Please advise 

## 2021-06-28 NOTE — Telephone Encounter (Signed)
Patient requesting cough medicine to be called into [facility] on [address]. She went to the ER yesterday and was dx with Covid.  CB# [phone]

## 2021-06-28 NOTE — Telephone Encounter (Signed)
Call placed to patient and patient made aware.  

## 2021-07-02 ENCOUNTER — Encounter: Payer: Self-pay | Admitting: Family Medicine

## 2021-07-02 ENCOUNTER — Ambulatory Visit (INDEPENDENT_AMBULATORY_CARE_PROVIDER_SITE_OTHER): Payer: Medicare HMO | Admitting: Family Medicine

## 2021-07-02 ENCOUNTER — Other Ambulatory Visit: Payer: Self-pay

## 2021-07-02 VITALS — BP 128/64 | HR 86 | Temp 97.8°F | Resp 14 | Ht 65.0 in | Wt 212.0 lb

## 2021-07-02 DIAGNOSIS — U071 COVID-19: Secondary | ICD-10-CM | POA: Diagnosis not present

## 2021-07-02 NOTE — Progress Notes (Signed)
Subjective:    Patient ID: Amanda Riddle, female    DOB: Jan 16, 1935, 86 y.o.   MRN: [id number]  On September 7, the patient was taken to the emergency room.  She woke up and felt a brick on her chest.  She developed coughing spells.  She was taken the emergency room where chest x-ray was negative.  EKG showed nonspecific chronic ST changes in the lateral leads.  Troponins were negative x2.  However COVID test was positive.  She was started on Paxil COVID and has 1 additional day remaining.  After 24 hours, the sensation of the brick on her chest resolved.  Cough is gradually improving.  She denies any chest pain shortness of breath or dyspnea on exertion today.  She denies any fevers or chills.  She denies any pleurisy or hemoptysis  Past Medical History:  Diagnosis Date   Diabetes mellitus    Diverticulosis    Hypercholesteremia    Hypertension    Past Surgical History:  Procedure Laterality Date   ABDOMINAL HYSTERECTOMY  1986   TONSILLECTOMY  1950   Current Outpatient Medications on File Prior to Visit  Medication Sig Dispense Refill   albuterol (VENTOLIN HFA) 108 (90 Base) MCG/ACT inhaler Inhale 2 puffs into the lungs every 6 (six) hours as needed. Shortness of breath 3 each 2   amLODipine (NORVASC) 5 MG tablet Take 1 tablet (5 mg total) by mouth daily. 90 tablet 3   aspirin EC 81 MG tablet Take 81 mg by mouth at bedtime.     atorvastatin (LIPITOR) 40 MG tablet Take 1 tablet (40 mg total) by mouth daily. 90 tablet 2   Blood Glucose Monitoring Suppl (TRUE METRIX METER) w/Device KIT      clotrimazole-betamethasone (LOTRISONE) cream Apply 1 application topically 2 (two) times daily. 30 g 0   Cyanocobalamin (VITAMIN B 12 PO) Take 1 tablet by mouth at bedtime.      Flaxseed, Linseed, (FLAXSEED OIL PO) Take 1 capsule by mouth 2 (two) times daily.      Glucosamine Sulfate-MSM (MSM-GLUCOSAMINE PO) Take 1 capsule by mouth 2 (two) times daily.     hydrochlorothiazide (HYDRODIURIL) 25 MG  tablet Take 1 tablet (25 mg total) by mouth daily. 90 tablet 3   HYDROcodone bit-homatropine (HYCODAN) 5-1.5 MG/5ML syrup Take 5 mLs by mouth every 8 (eight) hours as needed for cough. 120 mL 0   insulin isophane & regular human (NOVOLIN 70/30 FLEXPEN) (70-30) 100 UNIT/ML KwikPen INJECT 30  UNITS SUBCUTANEOUSLY IN THE MORNING AND 15 UNITS SUBCUTANEOUSLY IN THE AFTERNOON (Patient taking differently: INJECT 15 UNITS SUBCUTANEOUSLY IN THE MORNING) 15 mL 11   Insulin Syringe-Needle U-100 (B-D INS SYR ULTRAFINE .3CC/31G) 31G X 5/16" 0.3 ML MISC Use as directed to inject insulin SQ 2x daiy. Dx: E11.65 300 each 3   meclizine (ANTIVERT) 12.5 MG tablet TAKE 1 TABLET BY MOUTH 3 TIMES DAILY AS NEEDED FOR DIZZINESS. 40 tablet 0   metFORMIN (GLUCOPHAGE) 1000 MG tablet TAKE 1 TABLET TWICE DAILY 180 tablet 1   nirmatrelvir/ritonavir EUA (PAXLOVID) 20 x 150 MG & 10 x 100MG TABS Take 3 tablets by mouth 2 (two) times daily for 5 days. Patient GFR is 52. Creat 1.05 Take nirmatrelvir (150 mg) two tablets twice daily for 5 days and ritonavir (100 mg) one tablet twice daily for 5 days. 30 tablet 0   pioglitazone (ACTOS) 30 MG tablet Take 1 tablet (30 mg total) by mouth daily. 90 tablet 3   triamcinolone cream (  KENALOG) 0.1 % Apply 1 application topically 2 (two) times daily. 30 g 0   TRUE METRIX BLOOD GLUCOSE TEST test strip TEST BLOOD SUGAR THREE TIMES DAILY AS DIRECTED 300 strip 1   TRUEplus Lancets 30G MISC TEST BLOOD SUGAR THREE TIMES DAILY AS DIRECTED 300 each 1   No current facility-administered medications on file prior to visit.   Allergies  Allergen Reactions   Codeine Itching   Lisinopril Swelling   Social History   Socioeconomic History   Marital status: Divorced    Spouse name: Not on file   Number of children: 2   Years of education: Not on file   Highest education level: Not on file  Occupational History   Not on file  Tobacco Use   Smoking status: Never   Smokeless tobacco: Current    Types:  Snuff  Substance and Sexual Activity   Alcohol use: No   Drug use: No   Sexual activity: Not on file  Other Topics Concern   Not on file  Social History Narrative   Not on file   Social Determinants of Health   Financial Resource Strain: Low Risk    Difficulty of Paying Living Expenses: Not very hard  Food Insecurity: No Food Insecurity   Worried About Running Out of Food in the Last Year: Never true   Ran Out of Food in the Last Year: Never true  Transportation Needs: No Transportation Needs   Lack of Transportation (Medical): No   Lack of Transportation (Non-Medical): No  Physical Activity: Inactive   Days of Exercise per Week: 0 days   Minutes of Exercise per Session: 0 min  Stress: No Stress Concern Present   Feeling of Stress : Not at all  Social Connections: Socially Isolated   Frequency of Communication with Friends and Family: More than three times a week   Frequency of Social Gatherings with Friends and Family: Once a week   Attends Religious Services: Never   Marine scientist or Organizations: No   Attends Archivist Meetings: Never   Marital Status: Widowed  Human resources officer Violence: Not At Risk   Fear of Current or Ex-Partner: No   Emotionally Abused: No   Physically Abused: No   Sexually Abused: No     Review of Systems  All other systems reviewed and are negative.     Objective:   Physical Exam Vitals reviewed.  Constitutional:      Appearance: She is obese.  Cardiovascular:     Rate and Rhythm: Normal rate and regular rhythm.     Heart sounds: Normal heart sounds. No murmur heard.   No friction rub. No gallop.  Pulmonary:     Effort: Pulmonary effort is normal. No respiratory distress.     Breath sounds: Normal breath sounds. No stridor. No wheezing, rhonchi or rales.  Chest:     Chest wall: No tenderness.  Musculoskeletal:     Right lower leg: No edema.     Left lower leg: No edema.  Neurological:     Mental Status: She is  alert.          Assessment & Plan:  COVID-19 Patient seems to be recovering well.  Cautioned her to watch out for rebound in symptoms after she discontinues Paxlovid.  Recheck immediately if worsening

## 2021-07-19 ENCOUNTER — Other Ambulatory Visit: Payer: Self-pay | Admitting: Family Medicine

## 2021-07-19 DIAGNOSIS — E2839 Other primary ovarian failure: Secondary | ICD-10-CM

## 2021-07-25 ENCOUNTER — Ambulatory Visit: Payer: Medicare HMO

## 2021-07-25 ENCOUNTER — Other Ambulatory Visit: Payer: Medicare HMO

## 2021-07-25 DIAGNOSIS — M25561 Pain in right knee: Secondary | ICD-10-CM | POA: Diagnosis not present

## 2021-07-25 DIAGNOSIS — M79671 Pain in right foot: Secondary | ICD-10-CM | POA: Diagnosis not present

## 2021-07-25 DIAGNOSIS — S8001XA Contusion of right knee, initial encounter: Secondary | ICD-10-CM | POA: Diagnosis not present

## 2021-07-27 ENCOUNTER — Ambulatory Visit: Payer: Self-pay

## 2021-07-27 NOTE — Progress Notes (Signed)
Chronic Care Management Pharmacy Note  08/02/2021 Name:  Amanda Riddle MRN:  [id number] DOB:  1935-02-13  Summary: Initial visit with PharmD.  She reports low energy, occasional lower glucose reading and sleep disturbances.  Also reports high copay on Januvia, however she has still been taking this.  Recommendations/Changes made from today's visit: Chair exercises provided Recommend she increase her calorie intake with lean meats, etc to see if this helps her energy level. Patient assistance started for Januvia.  Plan: FU in 3 months with PharmD   Subjective: Amanda Riddle is an 86 y.o. year old female who is a primary patient of Pickard, Cammie Mcgee, MD.  The CCM team was consulted for assistance with disease management and care coordination needs.    Engaged with patient by telephone for initial visit in response to provider referral for pharmacy case management and/or care coordination services.   Consent to Services:  The patient was given the following information about Chronic Care Management services today, agreed to services, and gave verbal consent: 1. CCM service includes personalized support from designated clinical staff supervised by the primary care provider, including individualized plan of care and coordination with other care providers 2. 24/7 contact phone numbers for assistance for urgent and routine care needs. 3. Service will only be billed when office clinical staff spend 20 minutes or more in a month to coordinate care. 4. Only one practitioner may furnish and bill the service in a calendar month. 5.The patient may stop CCM services at any time (effective at the end of the month) by phone call to the office staff. 6. The patient will be responsible for cost sharing (co-pay) of up to 20% of the service fee (after annual deductible is met). Patient agreed to services and consent obtained.  Patient Care Team: Susy Frizzle, MD as PCP - General (Family  Medicine) [name], [facility] as Pharmacist (Pharmacist)  Recent office visits:  07/02/21 Dennard Schaumann) - FU from hospital recovering well from [place], patient to watch for rebound symptoms. 03/01/21 (Telephone) Dr. Dennard Schaumann Per note: Resume Novolin 70/30 15U SQ Q AM. 02/15/21 Dr. Dennard Schaumann For follow-up. STARTED Sitagliptin Phosphate 100 mg daily. 02/08/21 Dr. Dennard Schaumann For Bruising. Per note:  Dr. Dennard Schaumann recommended she stop her insulin and record her fasting blood sugars every morning and her 2-hour postprandial sugars every evening.  Report those values to me in 1 week.  If her morning sugars are between 101 130 and her 2-hour postprandial sugars are between 101 180, I will not start her back on insulin 02/07/21 (Telephone) Dr. Dennard Schaumann Per note: Decrease 70/30 to 20 in the am and 7 in the pm.   01/22/21 Eulogio Bear, NP. For follow-up. No medication changes. 11/16/20 Dr. Dennard Schaumann For follow-up. Per note: stop hydrochlorothiazide and increase amlodipine to 10 mg a day.   Recent consult visits:  None in the last six months   Hospital visits:  None in the last six months   Medication History: Januvia 100 mg 30 DS 02/15/21 Pioglitazone 30 mg 90 DS 02/06/21 Atorvastatin 50 mg 90 DS 02/02/21 Metformin 1000 mg 90 DS 01/16/21  Objective:  Lab Results  Component Value Date   CREATININE 1.05 (H) 06/27/2021   BUN 11 06/27/2021   GFRNONAA 52 (L) 06/27/2021   GFRAA 58 (L) 02/08/2021   NA 140 06/27/2021   K 3.7 06/27/2021   CALCIUM 9.0 06/27/2021   CO2 24 06/27/2021   GLUCOSE 114 (H) 06/27/2021    Lab Results  Component Value Date/Time   HGBA1C 6.4 (H) 05/17/2021 12:16 PM   HGBA1C 7.2 (H) 11/16/2020 09:26 AM   MICROALBUR 0.4 05/17/2021 12:16 PM   MICROALBUR 1.4 10/26/2020 08:29 AM    Last diabetic Eye exam:  Lab Results  Component Value Date/Time   HMDIABEYEEXA No Retinopathy 04/24/2021 11:54 AM    Last diabetic Foot exam: No results found for: HMDIABFOOTEX   Lab Results  Component  Value Date   CHOL 184 11/16/2020   HDL 74 11/16/2020   LDLCALC 87 11/16/2020   TRIG 132 11/16/2020   CHOLHDL 2.5 11/16/2020    Hepatic Function Latest Ref Rng & Units 05/17/2021 02/08/2021 11/16/2020  Total Protein 6.1 - 8.1 g/dL 6.7 6.3 6.4  Albumin 3.6 - 5.1 g/dL - - -  AST 10 - 35 U/L _0 ALT 6 - 29 U/L _1 Alk Phosphatase 33 - 130 U/L - - -  Total Bilirubin 0.2 - 1.2 mg/dL 1.2 0.8 1.0    No results found for: TSH, FREET4  CBC Latest Ref Rng & Units 06/27/2021 02/08/2021 08/17/2020  WBC 4.0 - 10.5 K/uL 5.9 6.3 5.2  Hemoglobin 12.0 - 15.0 g/dL 11.7(L) 12.3 11.6(L)  Hematocrit 36.0 - 46.0 % 35.5(L) 38.6 36.3  Platelets 150 - 400 K/uL 245 265 269    No results found for: VD25OH  Clinical ASCVD: No  The ASCVD Risk score (Arnett DK, et al., 2019) failed to calculate for the following reasons:   The 2019 ASCVD risk score is only valid for ages 10 to 38    Depression screen PHQ 2/9 03/09/2021 10/25/2020 11/30/2019  Decreased Interest 2 0 0  Down, Depressed, Hopeless 1 0 0  PHQ - 2 Score 3 0 0  Altered sleeping 3 - -  Tired, decreased energy 2 - -  Change in appetite 3 - -  Feeling bad or failure about yourself  0 - -  Trouble concentrating 0 - -  Moving slowly or fidgety/restless 0 - -  Suicidal thoughts 0 - -  PHQ-9 Score 11 - -  Difficult doing work/chores Somewhat difficult - -      Social History   Tobacco Use  Smoking Status Never  Smokeless Tobacco Current   Types: Snuff   BP Readings from Last 3 Encounters:  07/02/21 128/64  06/27/21 (!) 154/70  06/27/21 (!) 156/83   Pulse Readings from Last 3 Encounters:  07/02/21 86  06/27/21 84  06/27/21 92   Wt Readings from Last 3 Encounters:  07/02/21 212 lb (96.2 kg)  05/17/21 213 lb (96.6 kg)  03/09/21 221 lb (100.2 kg)   BMI Readings from Last 3 Encounters:  07/02/21 35.28 kg/m  05/17/21 35.45 kg/m  03/09/21 36.78 kg/m    Assessment/Interventions: Review of patient past medical history,  allergies, medications, health status, including review of consultants reports, laboratory and other test data, was performed as part of comprehensive evaluation and provision of chronic care management services.   SDOH:  (Social Determinants of Health) assessments and interventions performed: Yes  Financial Resource Strain: Low Risk    Difficulty of Paying Living Expenses: Not very hard    SDOH Screenings   Alcohol Screen: Low Risk    Last Alcohol Screening Score (AUDIT): 0  Depression (PHQ2-9): Medium Risk   PHQ-2 Score: 11  Financial Resource Strain: Low Risk    Difficulty of Paying Living Expenses: Not very hard  Food Insecurity: No Food Insecurity   Worried About Running Out of  Food in the Last Year: Never true   Ran Out of Food in the Last Year: Never true  Housing: Low Risk    Last Housing Risk Score: 0  Physical Activity: Inactive   Days of Exercise per Week: 0 days   Minutes of Exercise per Session: 0 min  Social Connections: Socially Isolated   Frequency of Communication with Friends and Family: More than three times a week   Frequency of Social Gatherings with Friends and Family: Once a week   Attends Religious Services: Never   Marine scientist or Organizations: No   Attends Archivist Meetings: Never   Marital Status: Widowed  Stress: No Stress Concern Present   Feeling of Stress : Not at all  Tobacco Use: High Risk   Smoking Tobacco Use: Never   Smokeless Tobacco Use: Current  Transportation Needs: No Transportation Needs   Lack of Transportation (Medical): No   Lack of Transportation (Non-Medical): No    CCM Care Plan  Allergies  Allergen Reactions   Codeine Itching   Lisinopril Swelling    Medications Reviewed Today     Reviewed by [name], [facility] (Pharmacist) on 08/02/21 at 1218  Med List Status: <None>   Medication Order Taking? Sig Documenting Provider Last Dose Status Informant  albuterol (VENTOLIN HFA) 108 (90 Base)  MCG/ACT inhaler [id number] Yes Inhale 2 puffs into the lungs every 6 (six) hours as needed. Shortness of breath Susy Frizzle, MD Taking Active   amLODipine (NORVASC) 5 MG tablet [id number] Yes Take 1 tablet (5 mg total) by mouth daily. Susy Frizzle, MD Taking Active   aspirin EC 81 MG tablet [id number] Yes Take 81 mg by mouth at bedtime. [provider] Taking Active Self  atorvastatin (LIPITOR) 40 MG tablet [id number] Yes Take 1 tablet (40 mg total) by mouth daily. Susy Frizzle, MD Taking Active   Blood Glucose Monitoring Suppl (TRUE METRIX METER) w/Device Drucie Opitz [id number] Yes  [provider] Taking Active   clotrimazole-betamethasone (LOTRISONE) cream [id number] Yes Apply 1 application topically 2 (two) times daily. Susy Frizzle, MD Taking Active   Cyanocobalamin (VITAMIN B 12 PO) [id number] Yes Take 1 tablet by mouth at bedtime.  [provider] Taking Active Self  Flaxseed, Linseed, (FLAXSEED OIL PO) [id number] Yes Take 1 capsule by mouth 2 (two) times daily.  [provider] Taking Active Self  Glucosamine Sulfate-MSM (MSM-GLUCOSAMINE PO) [id number] Yes Take 1 capsule by mouth 2 (two) times daily. [provider] Taking Active Self  hydrochlorothiazide (HYDRODIURIL) 25 MG tablet [id number] Yes Take 1 tablet (25 mg total) by mouth daily. Susy Frizzle, MD Taking Active   insulin isophane & regular human (NOVOLIN 70/30 FLEXPEN) (70-30) 100 UNIT/ML KwikPen [id number] Yes INJECT 30  UNITS SUBCUTANEOUSLY IN THE MORNING AND 15 UNITS SUBCUTANEOUSLY IN THE AFTERNOON  Patient taking differently: INJECT 15 UNITS SUBCUTANEOUSLY IN THE MORNING   Pickard, Cammie Mcgee, MD Taking Active   Insulin Syringe-Needle U-100 (B-D INS SYR ULTRAFINE .3CC/31G) 31G X 5/16" 0.3 ML MISC [id number] Yes Use as directed to inject insulin SQ 2x daiy. Dx: E11.65 Eulogio Bear, NP Taking Active   meclizine (ANTIVERT) 12.5 MG tablet [id number] Yes TAKE 1 TABLET BY MOUTH 3 TIMES  DAILY AS NEEDED FOR DIZZINESS. Susy Frizzle, MD Taking Active   metFORMIN (GLUCOPHAGE) 1000 MG tablet [id number] Yes TAKE 1 TABLET TWICE DAILY Susy Frizzle, MD Taking Active   pioglitazone (ACTOS) 30 MG tablet [id number] Yes Take  1 tablet (30 mg total) by mouth daily. Susy Frizzle, MD Taking Active   triamcinolone cream (KENALOG) 0.1 % [id number] Yes Apply 1 application topically 2 (two) times daily. Susy Frizzle, MD Taking Active   TRUE METRIX BLOOD GLUCOSE TEST test strip [id number] Yes TEST BLOOD SUGAR THREE TIMES DAILY AS DIRECTED Susy Frizzle, MD Taking Active   TRUEplus Lancets 30G MISC [id number] Yes TEST BLOOD SUGAR THREE TIMES DAILY AS DIRECTED Susy Frizzle, MD Taking Active             Patient Active Problem List   Diagnosis Date Noted   Pain due to onychomycosis of toenails of both feet 05/29/2021   Great toe pain, right 10/25/2020   Pain in joint, shoulder region 07/06/2013   Muscle spasms of neck 07/06/2013   Hyperglycemia 01/17/2012   HTN (hypertension) 01/17/2012   Hyperlipidemia 01/17/2012   DM (diabetes mellitus) ([place]) 01/17/2012    Immunization History  Administered Date(s) Administered   Fluad Quad(high Dose 65+) 07/23/2019, 08/17/2020   Influenza, High Dose Seasonal PF 08/15/2017, 08/14/2018   Influenza,inj,Quad PF,6+ Mos 07/05/2013, 07/05/2014, 08/10/2015, 08/26/2016   PFIZER(Purple Top)SARS-COV-2 Vaccination 04/14/2020, 05/05/2020   Pneumococcal Conjugate-13 11/02/2013   Pneumococcal Polysaccharide-23 05/09/2015    Conditions to be addressed/monitored:  HTN, DM, HLD?  Care Plan : General Pharmacy (Adult)  Updates made by [name], RPH since 08/02/2021 12:00 AM     Problem: HTN, HLD, DM   Priority: High  Onset Date: 04/30/2021     Long-Range Goal: Patient-Specific Goal   Start Date: 04/30/2021  Expected End Date: 10/31/2021  Recent Progress: On track  Priority: High  Note:   Current Barriers:  Unable to  independently afford treatment regimen Unable to independently monitor therapeutic efficacy Unable to achieve control of glucose.   Pharmacist Clinical Goal(s):  Patient will verbalize ability to afford treatment regimen achieve control of glucose as evidenced by monitoring/A1c adhere to prescribed medication regimen as evidenced by fill dates through collaboration with PharmD and provider.   Interventions: 1:1 collaboration with Susy Frizzle, MD regarding development and update of comprehensive plan of care as evidenced by provider attestation and co-signature Inter-disciplinary care team collaboration (see longitudinal plan of care) Comprehensive medication review performed; medication list updated in electronic medical record  Hypertension (BP goal <140/90) -Controlled -Current treatment: Amlodipine 5mg  daily HCTZ 25mg  daily -Medications previously tried: valsartan  -Current home readings: not checking -Current dietary habits: patient reports her calorie intake has not been what it used to be.  She likes to snack all day.   -Current exercise habits: minimal/none.  Walks outside only when family comes to visit. -Denies hypotensive/hypertensive symptoms -Educated on BP goals and benefits of medications for prevention of heart attack, stroke and kidney damage; Exercise goal of 150 minutes per week; Importance of home blood pressure monitoring; Symptoms of hypotension and importance of maintaining adequate hydration; -Counseled to monitor BP at home whenever able, document, and provide log at future appointments -Counseled on diet and exercise extensively Recommended to continue current medication Recommended implement some exercise plan, work her way up to 30 minutes 5 times per week.  Will provide chair exercises for her to do so that she does not have to walk outside alone.  Hyperlipidemia: (LDL goal < 70) -Not ideally controlled -Current treatment: Atorvastatin  40mg  -Medications previously tried: none noted  -Current dietary patterns: see above -Current exercise habits: minimal -Educated on Cholesterol goals;  Benefits of statin for ASCVD risk reduction; Importance  of limiting foods high in cholesterol; -Recommended to continue current medication Recommended she switch to night time dosing to see if this will help bring down LDL any more.  Wrote it on her bottle to take at night.  Update 08/02/21 Continues to take at night, has not had repeat lipid panel since she made that switch.  Would recommend repeat lipid panel to evaluate efficacy of current dose.  Continue current meds for now  Diabetes (A1c goal <7%) -Not ideally controlled -Current medications: Metformin 1000mg  BID Insulin NPH 70-30 15 units qam Pioglitazone 30mg  daily -Medications previously tried: none noted  -Current home glucose readings glucose: did not have her logs, reports it has been in the 70s once recently but this was near bed time.  Does not seem as if she checks consistently -Reports hypoglycemic/hyperglycemic symptoms - sometimes has heart beating fast and wakes up at night.   -Current meal patterns:  Reports appetite has been down lately.  Not eating much besides salads, fruit, unless she is at a family get together. -Current exercise: minimal -Educated on A1c and blood sugar goals; Complications of diabetes including kidney damage, retinal damage, and cardiovascular disease; Prevention and management of hypoglycemic episodes; Benefits of routine self-monitoring of blood sugar; -Counseled to check feet daily and get yearly eye exams -Counseled on diet and exercise extensively Recommended to continue current medication Assessed patient finances. She reports high copay on Januvia.  Based on her reported income she will be a candidate for [employer] PAP program.   Application provided, patient to get income verification and return completed application.  Update  08/02/21 She has not been checking glucose at home since her knee pain.  Have asked her to check at least a few times per week.  She needs updated A1c since has been approx. Three months off of Januvia.  Insulin dose has remained unchanged at 15 units qam.  She reports no episodes of hypoglycemia even though her appetite has decreased. Would recommend repeat A1c - then if elevated proceed with [place] application as we still need proof of income requirement completed.   Patient Goals/Self-Care Activities Patient will:  - take medications as prescribed focus on medication adherence by pill box check glucose at least once daily (would prefer twice), document, and provide at future appointments target a minimum of 150 minutes of moderate intensity exercise weekly  Follow Up Plan: The care management team will reach out to the patient again over the next 90 days.               Medication Assistance: Application for Januvia  medication assistance program. in process.  Anticipated assistance start date unknown.  See plan of care for additional detail.  Compliance/Adherence/Medication fill history: Care Gaps: Eye exam needed  Star-Rating Drugs: Januvia 100 mg 30 DS 02/15/21 Pioglitazone 30 mg 90 DS 02/06/21 Atorvastatin 50 mg 90 DS 02/02/21 Metformin 1000 mg 90 DS 01/16/21  Patient's preferred pharmacy is:  [id number] [place], [place] - [address] [place] [address] Phone: [phone] Fax: [phone]  [place], [place] [address] Phone: [phone] Fax: [phone]  Uses pill box? Yes Pt endorses 100% compliance  We discussed: Current pharmacy is preferred with insurance plan and patient is satisfied with pharmacy services Patient decided to: Continue current medication management strategy  Care Plan and Follow Up Patient  Decision:  Patient  agrees to Care Plan and Follow-up.  Plan: The care management team will reach out to the patient again over the next 90 days.  Beverly Milch, PharmD Clinical Pharmacist [place] [phone]

## 2021-07-30 ENCOUNTER — Telehealth: Payer: Self-pay | Admitting: *Deleted

## 2021-07-30 DIAGNOSIS — M79671 Pain in right foot: Secondary | ICD-10-CM | POA: Diagnosis not present

## 2021-07-30 DIAGNOSIS — W19XXXA Unspecified fall, initial encounter: Secondary | ICD-10-CM | POA: Diagnosis not present

## 2021-07-30 DIAGNOSIS — M25561 Pain in right knee: Secondary | ICD-10-CM | POA: Diagnosis not present

## 2021-07-30 NOTE — Telephone Encounter (Signed)
Received call from patient.   Reports that she was seen in UC over the weekend for back/ leg pain. States that she was advised to F/U with PCP for further evaluation, but she is unable to bear weight on leg at all this morning.   Denies fall or injury to leg. States that she cannot come to office as she is unable to walk.   Advised to go to ER, or contact EMS if she is unable to bear weight.

## 2021-08-02 ENCOUNTER — Ambulatory Visit (INDEPENDENT_AMBULATORY_CARE_PROVIDER_SITE_OTHER): Payer: Medicare HMO | Admitting: Pharmacist

## 2021-08-02 DIAGNOSIS — E782 Mixed hyperlipidemia: Secondary | ICD-10-CM

## 2021-08-02 DIAGNOSIS — E119 Type 2 diabetes mellitus without complications: Secondary | ICD-10-CM

## 2021-08-02 NOTE — Patient Instructions (Addendum)
Visit Information   Goals Addressed             This Visit's Progress    Monitor and Manage My Blood Sugar-Diabetes Type 2   On track    Timeframe:  Long-Range Goal Priority:  High Start Date:  04/30/21                           Expected End Date: 11/11/20                      Follow Up Date 07/20/21    - check blood sugar at prescribed times - check blood sugar if I feel it is too high or too low - take the blood sugar log to all doctor visits    Why is this important?   Checking your blood sugar at home helps to keep it from getting very high or very low.  Writing the results in a diary or log helps the doctor know how to care for you.  Your blood sugar log should have the time, date and the results.  Also, write down the amount of insulin or other medicine that you take.  Other information, like what you ate, exercise done and how you were feeling, will also be helpful.     Notes:        Patient Care Plan: General Pharmacy (Adult)     Problem Identified: HTN, HLD, DM   Priority: High  Onset Date: 04/30/2021     Long-Range Goal: Patient-Specific Goal   Start Date: 04/30/2021  Expected End Date: 10/31/2021  Recent Progress: On track  Priority: High  Note:   Current Barriers:  Unable to independently afford treatment regimen Unable to independently monitor therapeutic efficacy Unable to achieve control of glucose.   Pharmacist Clinical Goal(s):  Patient will verbalize ability to afford treatment regimen achieve control of glucose as evidenced by monitoring/A1c adhere to prescribed medication regimen as evidenced by fill dates through collaboration with PharmD and provider.   Interventions: 1:1 collaboration with Susy Frizzle, MD regarding development and update of comprehensive plan of care as evidenced by provider attestation and co-signature Inter-disciplinary care team collaboration (see longitudinal plan of care) Comprehensive medication review  performed; medication list updated in electronic medical record  Hypertension (BP goal <140/90) -Controlled -Current treatment: Amlodipine 5mg  daily HCTZ 25mg  daily -Medications previously tried: valsartan  -Current home readings: not checking -Current dietary habits: patient reports her calorie intake has not been what it used to be.  She likes to snack all day.   -Current exercise habits: minimal/none.  Walks outside only when family comes to visit. -Denies hypotensive/hypertensive symptoms -Educated on BP goals and benefits of medications for prevention of heart attack, stroke and kidney damage; Exercise goal of 150 minutes per week; Importance of home blood pressure monitoring; Symptoms of hypotension and importance of maintaining adequate hydration; -Counseled to monitor BP at home whenever able, document, and provide log at future appointments -Counseled on diet and exercise extensively Recommended to continue current medication Recommended implement some exercise plan, work her way up to 30 minutes 5 times per week.  Will provide chair exercises for her to do so that she does not have to walk outside alone.  Hyperlipidemia: (LDL goal < 70) -Not ideally controlled -Current treatment: Atorvastatin 40mg  -Medications previously tried: none noted  -Current dietary patterns: see above -Current exercise habits: minimal -Educated on Cholesterol goals;  Benefits of statin  for ASCVD risk reduction; Importance of limiting foods high in cholesterol; -Recommended to continue current medication Recommended she switch to night time dosing to see if this will help bring down LDL any more.  Wrote it on her bottle to take at night.  Update 08/02/21 Continues to take at night, has not had repeat lipid panel since she made that switch.  Would recommend repeat lipid panel to evaluate efficacy of current dose.  Continue current meds for now  Diabetes (A1c goal <7%) -Not ideally  controlled -Current medications: Metformin 1000mg  BID Insulin NPH 70-30 15 units qam Pioglitazone 30mg  daily -Medications previously tried: none noted  -Current home glucose readings glucose: did not have her logs, reports it has been in the 70s once recently but this was near bed time.  Does not seem as if she checks consistently -Reports hypoglycemic/hyperglycemic symptoms - sometimes has heart beating fast and wakes up at night.   -Current meal patterns:  Reports appetite has been down lately.  Not eating much besides salads, fruit, unless she is at a family get together. -Current exercise: minimal -Educated on A1c and blood sugar goals; Complications of diabetes including kidney damage, retinal damage, and cardiovascular disease; Prevention and management of hypoglycemic episodes; Benefits of routine self-monitoring of blood sugar; -Counseled to check feet daily and get yearly eye exams -Counseled on diet and exercise extensively Recommended to continue current medication Assessed patient finances. She reports high copay on Januvia.  Based on her reported income she will be a candidate for [employer] PAP program.   Application provided, patient to get income verification and return completed application.  Update 08/02/21 She has not been checking glucose at home since her knee pain.  Have asked her to check at least a few times per week.  She needs updated A1c since has been approx. Three months off of Januvia.  Insulin dose has remained unchanged at 15 units qam.  She reports no episodes of hypoglycemia even though her appetite has decreased. Would recommend repeat A1c - then if elevated proceed with [place] application as we still need proof of income requirement completed.   Patient Goals/Self-Care Activities Patient will:  - take medications as prescribed focus on medication adherence by pill box check glucose at least once daily (would prefer twice), document, and provide at future  appointments target a minimum of 150 minutes of moderate intensity exercise weekly  Follow Up Plan: The care management team will reach out to the patient again over the next 90 days.              Patient verbalizes understanding of instructions provided today and agrees to view in [place].  Telephone follow up appointment with pharmacy team member scheduled for: 6 months  [name], [place]

## 2021-08-10 ENCOUNTER — Other Ambulatory Visit: Payer: Self-pay | Admitting: Family Medicine

## 2021-08-13 ENCOUNTER — Encounter: Payer: Self-pay | Admitting: Family Medicine

## 2021-08-13 ENCOUNTER — Other Ambulatory Visit: Payer: Self-pay

## 2021-08-13 ENCOUNTER — Ambulatory Visit (INDEPENDENT_AMBULATORY_CARE_PROVIDER_SITE_OTHER): Payer: Medicare HMO | Admitting: Nurse Practitioner

## 2021-08-13 ENCOUNTER — Encounter: Payer: Self-pay | Admitting: Nurse Practitioner

## 2021-08-13 VITALS — BP 140/76 | HR 103 | Temp 97.2°F | Resp 12 | Wt 211.0 lb

## 2021-08-13 DIAGNOSIS — M25561 Pain in right knee: Secondary | ICD-10-CM | POA: Diagnosis not present

## 2021-08-13 MED ORDER — DICLOFENAC SODIUM 1 % EX GEL: 4.0000 g | Freq: Four times a day (QID) | CUTANEOUS | 1 refills | Status: AC

## 2021-08-13 NOTE — Progress Notes (Signed)
Subjective:    Patient ID: Amanda Amanda Riddle, female    DOB: August 19, 1935, 86 y.o.   MRN: [id number]  HPI: Amanda Amanda Riddle is a 86 y.o. female presenting with daughter for knee pain.  Chief Complaint  Patient presents with   Knee Injury    Patient fell down 2 steps and hurt her right knee. Patient been to 2 Urgent Care.   KNEE PAIN Duration: weeks Involved knee: right Mechanism of injury: fall Location:lateral Onset: sudden Severity: moderate to severe   Quality: throbbing Frequency: with weight bearing  Radiation: yes Aggravating factors: weight bearing  Alleviating factors:  Tylenol, Meloxicam  Status: better Treatments attempted: Tylenol, Meloxicam   Relief with NSAIDs?:  No NSAIDs Taken Weakness with weight bearing or walking: yes Sensation of giving way: yes Locking: no Popping: no Bruising: yes Swelling: yes Redness: no Paresthesias/decreased sensation: no Fevers: no  Daughter is asking for a note for work; since her mom fell, she has been unable to work because she has needed to help her mom with cooking, bathing, and dressing, etc.   Allergies  Allergen Reactions   Codeine Itching   Lisinopril Swelling    Outpatient Encounter Medications as of 08/13/2021  Medication Sig   albuterol (VENTOLIN HFA) 108 (90 Base) MCG/ACT inhaler Inhale 2 puffs into the lungs every 6 (Amanda Riddle) hours as needed. Shortness of breath   amLODipine (NORVASC) 5 MG tablet Take 1 tablet (5 mg total) by mouth daily.   aspirin EC 81 MG tablet Take 81 mg by mouth at bedtime.   atorvastatin (LIPITOR) 40 MG tablet TAKE 1 TABLET (40 MG TOTAL) BY MOUTH DAILY.   Blood Glucose Monitoring Suppl (TRUE METRIX METER) w/Device KIT    clotrimazole-betamethasone (LOTRISONE) cream Apply 1 application topically 2 (two) times daily.   Cyanocobalamin (VITAMIN B 12 PO) Take 1 tablet by mouth at bedtime.    diclofenac Sodium (VOLTAREN) 1 % GEL Apply 4 g topically 4 (four) times daily.   Flaxseed, Linseed,  (FLAXSEED OIL PO) Take 1 capsule by mouth 2 (two) times daily.    Glucosamine Sulfate-MSM (MSM-GLUCOSAMINE PO) Take 1 capsule by mouth 2 (two) times daily.   hydrochlorothiazide (HYDRODIURIL) 25 MG tablet Take 1 tablet (25 mg total) by mouth daily.   insulin isophane & regular human (NOVOLIN 70/30 FLEXPEN) (70-30) 100 UNIT/ML KwikPen INJECT 30  UNITS SUBCUTANEOUSLY IN THE MORNING AND 15 UNITS SUBCUTANEOUSLY IN THE AFTERNOON (Patient taking differently: INJECT 15 UNITS SUBCUTANEOUSLY IN THE MORNING)   Insulin Syringe-Needle U-100 (B-D INS SYR ULTRAFINE .3CC/31G) 31G X 5/16" 0.3 ML MISC Use as directed to inject insulin SQ 2x daiy. Dx: E11.65   meclizine (ANTIVERT) 12.5 MG tablet TAKE 1 TABLET BY MOUTH 3 TIMES DAILY AS NEEDED FOR DIZZINESS.   metFORMIN (GLUCOPHAGE) 1000 MG tablet TAKE 1 TABLET TWICE DAILY   pioglitazone (ACTOS) 30 MG tablet Take 1 tablet (30 mg total) by mouth daily.   triamcinolone cream (KENALOG) 0.1 % Apply 1 application topically 2 (two) times daily.   TRUE METRIX BLOOD GLUCOSE TEST test strip TEST BLOOD SUGAR THREE TIMES DAILY AS DIRECTED   TRUEplus Lancets 30G MISC TEST BLOOD SUGAR THREE TIMES DAILY AS DIRECTED   No facility-administered encounter medications on file as of 08/13/2021.    Patient Active Problem List   Diagnosis Date Noted   Pain due to onychomycosis of toenails of both feet 05/29/2021   Great toe pain, right 10/25/2020   Pain in joint, shoulder region 07/06/2013   Muscle  spasms of neck 07/06/2013   Hyperglycemia 01/17/2012   HTN (hypertension) 01/17/2012   Hyperlipidemia 01/17/2012   DM (diabetes mellitus) ([place]) 01/17/2012    Past Medical History:  Diagnosis Date   Diabetes mellitus    Diverticulosis    Hypercholesteremia    Hypertension     Relevant past medical, surgical, family and social history reviewed and updated as indicated. Interim medical history since our last visit reviewed.  Review of Systems Per HPI unless specifically  indicated above     Objective:    BP 140/76 (BP Location: Right Arm, Patient Position: Sitting, Cuff Size: Normal)   Pulse (!) 103   Temp (!) 97.2 F (36.2 C) (Temporal)   Resp 12   Wt 211 lb (95.7 kg)   SpO2 97%   BMI 35.11 kg/m   Wt Readings from Last 3 Encounters:  08/13/21 211 lb (95.7 kg)  07/02/21 212 lb (96.2 kg)  05/17/21 213 lb (96.6 kg)    Physical Exam Vitals and nursing note reviewed.  Constitutional:      General: She is not in acute distress.    Appearance: Normal appearance. She is obese. She is not toxic-appearing.  Musculoskeletal:        General: Swelling and tenderness present.     Right knee: Swelling, effusion and ecchymosis present. No deformity, erythema or crepitus. Normal range of motion. Normal alignment.     Left knee: Normal.     Right lower leg: No edema.     Left lower leg: No edema.       Legs:  Skin:    General: Skin is warm.     Capillary Refill: Capillary refill takes less than 2 seconds.     Coloration: Skin is not jaundiced or pale.     Findings: No erythema.  Neurological:     Mental Status: She is alert and oriented to person, place, and time.     Motor: Weakness present.     Gait: Gait abnormal (walking with 4 wheel waker).  Psychiatric:        Mood and Affect: Mood normal.        Behavior: Behavior normal.        Thought Content: Thought content normal.        Judgment: Judgment normal.      Assessment & Plan:  1. Acute pain of right knee Acute.  Pain and immobility seem to be improving, although it is slow.  Encouraged ice, compression, topical NSAID.  Also start Physical Therapy.  Can consider MRI imaging if pain does not continue to gradually improve.   - diclofenac Sodium (VOLTAREN) 1 % GEL; Apply 4 g topically 4 (four) times daily.  Dispense: 50 g; Refill: 1 - Ambulatory referral to Physical Therapy    Follow up plan: No follow-ups on file.

## 2021-08-20 DIAGNOSIS — E782 Mixed hyperlipidemia: Secondary | ICD-10-CM

## 2021-08-20 DIAGNOSIS — E119 Type 2 diabetes mellitus without complications: Secondary | ICD-10-CM | POA: Diagnosis not present

## 2021-08-20 DIAGNOSIS — Z794 Long term (current) use of insulin: Secondary | ICD-10-CM | POA: Diagnosis not present

## 2021-09-04 ENCOUNTER — Ambulatory Visit: Payer: Medicare HMO | Admitting: Podiatry

## 2021-09-25 ENCOUNTER — Ambulatory Visit (INDEPENDENT_AMBULATORY_CARE_PROVIDER_SITE_OTHER): Payer: Medicare HMO

## 2021-09-25 ENCOUNTER — Other Ambulatory Visit: Payer: Self-pay

## 2021-09-25 DIAGNOSIS — Z23 Encounter for immunization: Secondary | ICD-10-CM | POA: Diagnosis not present

## 2021-11-01 DIAGNOSIS — H25811 Combined forms of age-related cataract, right eye: Secondary | ICD-10-CM | POA: Diagnosis not present

## 2021-11-01 DIAGNOSIS — E119 Type 2 diabetes mellitus without complications: Secondary | ICD-10-CM | POA: Diagnosis not present

## 2021-11-01 DIAGNOSIS — H01001 Unspecified blepharitis right upper eyelid: Secondary | ICD-10-CM | POA: Diagnosis not present

## 2021-11-01 DIAGNOSIS — H01002 Unspecified blepharitis right lower eyelid: Secondary | ICD-10-CM | POA: Diagnosis not present

## 2021-11-05 NOTE — H&P (Signed)
Surgical History & Physical  Patient Name: Amanda Riddle DOB: 09-13-1952  Surgery: Cataract extraction with intraocular lens implant phacoemulsification; Right Eye  Surgeon: Baruch Goldmann MD Surgery Date:  11-12-21 Pre-Op Date:  11-01-21  HPI: A 15 Yr. old female patient The patient is returning after cataract surgery. The left eye is affected. Status post cataract surgery, which began 3 days ago: 10/29/21. Since the last visit, the affected area is doing well. The patient's vision is improved. Patient is following medication instructions. Pt taking Ilevro qday, Vigamox and Pred TID OS. Pt denies any eye pain or increase in floaters/flashes of light. The patient complains of difficulty when seeing street signs, which began 1 year ago. The right eye is affected. The episode is gradual. The condition's severity increased since last visit. Symptoms occur when the patient is driving and outside. This is negatively affecting the patient's quality of life and the patient is unable to function adequately in life with the current level of vision. HPI was performed by Baruch Goldmann .  Medical History: White without pressure, Proptosis, Pinguecula, Homonymous VF defect, Hypertensive Retinopathy Diabetic Retinopathy Diabetes High Blood Pressure Stroke 2017  Review of Systems Negative Allergic/Immunologic Negative Cardiovascular Negative Constitutional Negative Ear, Nose, Mouth & Throat Negative Endocrine Negative Eyes Negative Gastrointestinal Negative Genitourinary Negative Hemotologic/Lymphatic Negative Integumentary Negative Musculoskeletal Negative Neurological Negative Psychiatry Negative Respiratory  Social   Current every day smoker   Medication Naphcon A, Moxifloxacin, Ilevro, Prednisolone acetate 1%, Amlodipine besylate, Atorvastatin, Aspirin, Clopidogrel, Glipizide, Humalog, Levemir, Loratadine, Hydrochlorothiazide, Insulin,   Sx/Procedures Phaco c IOL OS, Hysterectomy,  Cystectomy,   Drug Allergies  Peach flavor (bulk),   History & Physical: Heent: Cataract, Right Eye NECK: supple without bruits LUNGS: lungs clear to auscultation CV: regular rate and rhythm Abdomen: soft and non-tender  Impression & Plan: Assessment: 1.  CATARACT EXTRACTION STATUS; Left Eye (Z98.42) 2.  COMBINED FORMS AGE RELATED CATARACT; , Right Eye (H25.811) 3.  NUCLEAR SCLEROSIS AGE RELATED; Right Eye (H25.11)  Plan: 1.  3 days after cataract surgery. Doing well with improved vision and normal eye pressure. Call with any problems or concerns. Continue all drops as instructed.  2.  Cataract accounts for the patient's decreased vision. This visual impairment is not correctable with a tolerable change in glasses or contact lenses. Cataract surgery with an implantation of a new lens should significantly improve the visual and functional status of the patient. Discussed all risks, benefits, alternatives, and potential complications. Discussed the procedures and recovery. Patient desires to have surgery. A-scan ordered and performed today for intra-ocular lens calculations. The surgery will be performed in order to improve vision for driving, reading, and for eye examinations. Recommend phacoemulsification with intra-ocular lens. Recommend Dextenza for post-operative pain and inflammation. Right Eye. Surgery required to correct imbalance of vision. Dilates well - shugarcaine by protocol.  3.

## 2021-11-07 ENCOUNTER — Other Ambulatory Visit: Payer: Self-pay

## 2021-11-07 ENCOUNTER — Encounter (HOSPITAL_COMMUNITY): Payer: Self-pay

## 2021-11-07 ENCOUNTER — Encounter (HOSPITAL_COMMUNITY)
Admission: RE | Admit: 2021-11-07 | Discharge: 2021-11-07 | Disposition: A | Discharge status: Home / Self Care | Payer: Medicare Other | Source: Ambulatory Visit | Attending: Ophthalmology | Admitting: Ophthalmology

## 2021-11-12 ENCOUNTER — Encounter (HOSPITAL_COMMUNITY): Payer: Self-pay | Admitting: Ophthalmology

## 2021-11-12 ENCOUNTER — Encounter (HOSPITAL_COMMUNITY)
Admission: RE | Disposition: A | Discharge status: Home / Self Care | Payer: Self-pay | Source: Home / Self Care | Attending: Ophthalmology

## 2021-11-12 ENCOUNTER — Ambulatory Visit (HOSPITAL_COMMUNITY): Payer: Medicare Other | Admitting: Certified Registered"

## 2021-11-12 ENCOUNTER — Ambulatory Visit (HOSPITAL_COMMUNITY)
Admission: RE | Admit: 2021-11-12 | Discharge: 2021-11-12 | Disposition: A | Discharge status: Home / Self Care | Payer: Medicare Other | Attending: Ophthalmology | Admitting: Ophthalmology

## 2021-11-12 ENCOUNTER — Other Ambulatory Visit: Payer: Self-pay

## 2021-11-12 DIAGNOSIS — H25811 Combined forms of age-related cataract, right eye: Secondary | ICD-10-CM | POA: Diagnosis not present

## 2021-11-12 DIAGNOSIS — E1136 Type 2 diabetes mellitus with diabetic cataract: Secondary | ICD-10-CM | POA: Diagnosis not present

## 2021-11-12 DIAGNOSIS — Z9842 Cataract extraction status, left eye: Secondary | ICD-10-CM | POA: Diagnosis not present

## 2021-11-12 HISTORY — PX: CATARACT EXTRACTION W/PHACO: SHX586

## 2021-11-12 LAB — GLUCOSE, CAPILLARY: Glucose-Capillary: 118 mg/dL — ABNORMAL HIGH (ref 70–99)

## 2021-11-12 SURGERY — PHACOEMULSIFICATION, CATARACT, WITH IOL INSERTION
Anesthesia: Monitor Anesthesia Care | Site: Eye | Laterality: Right

## 2021-11-12 MED ORDER — MIDAZOLAM HCL 5 MG/5ML IJ SOLN
INTRAMUSCULAR | Status: DC | PRN
  Administered 2021-11-12: 1 mg via INTRAVENOUS

## 2021-11-12 MED ORDER — TETRACAINE HCL 0.5 % OP SOLN
1.0000 [drp] | OPHTHALMIC | Status: AC | PRN
  Administered 2021-11-12 (×3): 1 [drp] via OPHTHALMIC

## 2021-11-12 MED ORDER — TETRACAINE 0.5 % OP SOLN OPTIME - NO CHARGE
OPHTHALMIC | Status: DC | PRN
  Administered 2021-11-12: 2 [drp] via OPHTHALMIC

## 2021-11-12 MED ORDER — POVIDONE-IODINE 5 % OP SOLN
OPHTHALMIC | Status: DC | PRN
  Administered 2021-11-12: 1 via OPHTHALMIC

## 2021-11-12 MED ORDER — MIDAZOLAM HCL 2 MG/2ML IJ SOLN
INTRAMUSCULAR | Status: AC
  Filled 2021-11-12: qty 2

## 2021-11-12 MED ORDER — LIDOCAINE HCL (PF) 1 % IJ SOLN
INTRAOCULAR | Status: DC | PRN
  Administered 2021-11-12: 1 mL via OPHTHALMIC

## 2021-11-12 MED ORDER — STERILE WATER FOR IRRIGATION IR SOLN
Status: DC | PRN
  Administered 2021-11-12: 250 mL

## 2021-11-12 MED ORDER — SODIUM HYALURONATE 23MG/ML IO SOSY
PREFILLED_SYRINGE | INTRAOCULAR | Status: DC | PRN
  Administered 2021-11-12: 0.6 mL via INTRAOCULAR

## 2021-11-12 MED ORDER — PHENYLEPHRINE HCL 2.5 % OP SOLN
1.0000 [drp] | OPHTHALMIC | Status: AC | PRN
  Administered 2021-11-12 (×3): 1 [drp] via OPHTHALMIC

## 2021-11-12 MED ORDER — BSS IO SOLN
INTRAOCULAR | Status: DC | PRN
  Administered 2021-11-12: 15 mL via INTRAOCULAR

## 2021-11-12 MED ORDER — NEOMYCIN-POLYMYXIN-DEXAMETH 3.5-10000-0.1 OP SUSP
OPHTHALMIC | Status: DC | PRN
  Administered 2021-11-12: 1 [drp] via OPHTHALMIC

## 2021-11-12 MED ORDER — TROPICAMIDE 1 % OP SOLN
1.0000 [drp] | OPHTHALMIC | Status: AC | PRN
  Administered 2021-11-12 (×3): 1 [drp] via OPHTHALMIC
  Filled 2021-11-12: qty 15

## 2021-11-12 MED ORDER — LIDOCAINE HCL 3.5 % OP GEL
1.0000 "application " | Freq: Once | OPHTHALMIC | Status: AC
  Administered 2021-11-12: 1 via OPHTHALMIC

## 2021-11-12 MED ORDER — EPINEPHRINE PF 1 MG/ML IJ SOLN
INTRAOCULAR | Status: DC | PRN
  Administered 2021-11-12: 500 mL

## 2021-11-12 MED ORDER — SODIUM HYALURONATE 10 MG/ML IO SOLUTION
PREFILLED_SYRINGE | INTRAOCULAR | Status: DC | PRN
  Administered 2021-11-12: 0.85 mL via INTRAOCULAR

## 2021-11-12 SURGICAL SUPPLY — 14 items
CATARACT SUITE SIGHTPATH (MISCELLANEOUS) ×3 IMPLANT
CLOTH BEACON ORANGE TIMEOUT ST (SAFETY) ×3 IMPLANT
EYE SHIELD UNIVERSAL CLEAR (GAUZE/BANDAGES/DRESSINGS) ×2 IMPLANT
FEE CATARACT SUITE SIGHTPATH (MISCELLANEOUS) ×1 IMPLANT
GLOVE SURG UNDER POLY LF SZ7 (GLOVE) ×4 IMPLANT
LENS IOL DIOP 19.5 (Intraocular Lens) ×3 IMPLANT
LENS IOL TECNIS MONO 19.5 (Intraocular Lens) IMPLANT
NDL HYPO 18GX1.5 BLUNT FILL (NEEDLE) ×1 IMPLANT
NEEDLE HYPO 18GX1.5 BLUNT FILL (NEEDLE) ×3 IMPLANT
PAD ARMBOARD 7.5X6 YLW CONV (MISCELLANEOUS) ×3 IMPLANT
SYR TB 1ML LL NO SAFETY (SYRINGE) ×3 IMPLANT
TAPE SURG TRANSPORE 1 IN (GAUZE/BANDAGES/DRESSINGS) IMPLANT
TAPE SURGICAL TRANSPORE 1 IN (GAUZE/BANDAGES/DRESSINGS) ×3
WATER STERILE IRR 250ML POUR (IV SOLUTION) ×3 IMPLANT

## 2021-11-12 NOTE — Op Note (Signed)
Date of procedure: 11/12/21  Pre-operative diagnosis:  Visually significant combined form age-related cataract, Right Eye (H25.811)  Post-operative diagnosis:  Visually significant combined form age-related cataract, Right Eye (H25.811)  Procedure: Removal of cataract via phacoemulsification and insertion of intra-ocular lens Wynetta Emery and Johnson DCB00 +19.5D into the capsular bag of the Right Eye  Attending surgeon: Gerda Diss. Asianae Minkler, MD, MA  Anesthesia: MAC, Topical Ak[place]  Complications: None  Estimated Blood Loss: <52m (minimal)  Specimens: None  Implants: As above  Indications:  Visually significant age-related cataract, Right Eye  Procedure:  The patient was seen and identified in the pre-operative area. The operative eye was identified and dilated.  The operative eye was marked.  Topical anesthesia was administered to the operative eye.     The patient was then to the operative suite and placed in the supine position.  A timeout was performed confirming the patient, procedure to be performed, and all other relevant information.   The patient's face was prepped and draped in the usual fashion for intra-ocular surgery.  A lid speculum was placed into the operative eye and the surgical microscope moved into place and focused.  A superotemporal paracentesis was created using a 20 gauge paracentesis blade.  Shugarcaine was injected into the anterior chamber.  Viscoelastic was injected into the anterior chamber.  A temporal clear-corneal main wound incision was created using a 2.449mmicrokeratome.  A continuous curvilinear capsulorrhexis was initiated using an irrigating cystitome and completed using capsulorrhexis forceps.  Hydrodissection and hydrodeliniation were performed.  Viscoelastic was injected into the anterior chamber.  A phacoemulsification handpiece and a chopper as a second instrument were used to remove the nucleus and epinucleus. The irrigation/aspiration handpiece was used to  remove any remaining cortical material.   The capsular bag was reinflated with viscoelastic, checked, and found to be intact.  The intraocular lens was inserted into the capsular bag.  The irrigation/aspiration handpiece was used to remove any remaining viscoelastic.  The clear corneal wound and paracentesis wounds were then hydrated and checked with Weck-Cels to be watertight.  The lid-speculum was removed.  The drape was removed.  The patient's face was cleaned with a wet and dry 4x4.   Maxitrol was instilled in the eye. A clear shield was taped over the eye. The patient was taken to the post-operative care unit in good condition, having tolerated the procedure well.  Post-Op Instructions: [facility] and will receive all other orders and instructions.

## 2021-11-12 NOTE — Anesthesia Postprocedure Evaluation (Signed)
Anesthesia Post Note  Patient: Amanda Riddle  Procedure(s) Performed: CATARACT EXTRACTION PHACO AND INTRAOCULAR LENS PLACEMENT (IOC) (Right: Eye)  Patient location during evaluation: Phase II Anesthesia Type: MAC Level of consciousness: awake Pain management: pain level controlled Vital Signs Assessment: post-procedure vital signs reviewed and stable Respiratory status: spontaneous breathing and respiratory function stable Cardiovascular status: blood pressure returned to baseline and stable Postop Assessment: no headache and no apparent nausea or vomiting Anesthetic complications: no Comments: Late entry   No notable events documented.   Last Vitals:  Vitals:   11/12/21 1033 11/12/21 1126  BP: 123/67 110/70  Pulse: 92 85  Resp: 18 14  Temp: 36.8 C 36.5 C  SpO2: 98% 98%    Last Pain:  Vitals:   11/12/21 1126  TempSrc: Oral  PainSc: 0-No pain                 Louann Sjogren

## 2021-11-12 NOTE — Interval H&P Note (Signed)
History and Physical Interval Note:  11/12/2021 10:58 AM  Amanda Riddle  has presented today for surgery, with the diagnosis of combined forms age related cataract; right eye.  The various methods of treatment have been discussed with the patient and family. After consideration of risks, benefits and other options for treatment, the patient has consented to  Procedure(s) with comments: CATARACT EXTRACTION PHACO AND INTRAOCULAR LENS PLACEMENT (IOC) (Right) - CDE:  as a surgical intervention.  The patient's history has been reviewed, patient examined, no change in status, stable for surgery.  I have reviewed the patient's chart and labs.  Questions were answered to the patient's satisfaction.     Baruch Goldmann

## 2021-11-12 NOTE — Transfer of Care (Signed)
Immediate Anesthesia Transfer of Care Note  Patient: Amanda Riddle  Procedure(s) Performed: CATARACT EXTRACTION PHACO AND INTRAOCULAR LENS PLACEMENT (IOC) (Right: Eye)  Patient Location: Short Stay  Anesthesia Type:MAC  Level of Consciousness: awake, alert , oriented and patient cooperative  Airway & Oxygen Therapy: Patient Spontanous Breathing  Post-op Assessment: Report given to RN, Post -op Vital signs reviewed and stable and Patient moving all extremities  Post vital signs: Reviewed and stable  Last Vitals:  Vitals Value Taken Time  BP    Temp    Pulse    Resp    SpO2      Last Pain:  Vitals:   11/12/21 1033  TempSrc: Oral  PainSc: 0-No pain         Complications: No notable events documented.

## 2021-11-12 NOTE — Anesthesia Preprocedure Evaluation (Signed)
Anesthesia Evaluation  Patient identified by MRN, date of birth, ID band Patient awake    Reviewed: Allergy & Precautions, H&P , NPO status , Patient's Chart, lab work & pertinent test results, reviewed documented beta blocker date and time   Airway Mallampati: II  TM Distance: >3 FB Neck ROM: full    Dental no notable dental hx.    Pulmonary neg pulmonary ROS,    Pulmonary exam normal breath sounds clear to auscultation       Cardiovascular Exercise Tolerance: Good hypertension, negative cardio ROS   Rhythm:regular Rate:Normal     Neuro/Psych  Neuromuscular disease negative psych ROS   GI/Hepatic negative GI ROS, Neg liver ROS,   Endo/Other  negative endocrine ROSdiabetes, Type 2  Renal/GU negative Renal ROS  negative genitourinary   Musculoskeletal   Abdominal   Peds  Hematology negative hematology ROS (+)   Anesthesia Other Findings   Reproductive/Obstetrics negative OB ROS                             Anesthesia Physical Anesthesia Plan  ASA: 2  Anesthesia Plan: MAC   Post-op Pain Management:    Induction:   PONV Risk Score and Plan:   Airway Management Planned:   Additional Equipment:   Intra-op Plan:   Post-operative Plan:   Informed Consent: I have reviewed the patients History and Physical, chart, labs and discussed the procedure including the risks, benefits and alternatives for the proposed anesthesia with the patient or authorized representative who has indicated his/her understanding and acceptance.     Dental Advisory Given  Plan Discussed with: CRNA  Anesthesia Plan Comments:         Anesthesia Quick Evaluation

## 2021-11-12 NOTE — Discharge Instructions (Addendum)
Please discharge patient when stable, will follow up today with Dr. Wrzosek at the [facility] office immediately following discharge.  Leave shield in place until visit.  All paperwork with discharge instructions will be given at the office.   [facility] Address:  [address]  [place], [place] [address]  

## 2021-11-13 ENCOUNTER — Encounter (HOSPITAL_COMMUNITY): Payer: Self-pay | Admitting: Ophthalmology

## 2021-11-14 ENCOUNTER — Telehealth: Payer: Self-pay | Admitting: Family Medicine

## 2021-11-14 NOTE — Telephone Encounter (Signed)
Patient just missed a call; unsure of who was trying to contact her.

## 2021-11-19 ENCOUNTER — Ambulatory Visit: Payer: Medicare HMO | Admitting: Family Medicine

## 2021-11-21 ENCOUNTER — Other Ambulatory Visit: Payer: Self-pay

## 2021-11-21 MED ORDER — METFORMIN HCL 1000 MG PO TABS: ORAL_TABLET | ORAL | 1 refills | Status: DC

## 2021-12-13 ENCOUNTER — Encounter: Payer: Self-pay | Admitting: Family Medicine

## 2021-12-13 ENCOUNTER — Ambulatory Visit (INDEPENDENT_AMBULATORY_CARE_PROVIDER_SITE_OTHER): Payer: Medicare Other | Admitting: Family Medicine

## 2021-12-13 ENCOUNTER — Other Ambulatory Visit: Payer: Self-pay

## 2021-12-13 VITALS — BP 138/78 | HR 90 | Temp 97.0°F | Resp 18 | Ht 65.0 in | Wt 219.0 lb

## 2021-12-13 DIAGNOSIS — Z794 Long term (current) use of insulin: Secondary | ICD-10-CM | POA: Diagnosis not present

## 2021-12-13 DIAGNOSIS — I509 Heart failure, unspecified: Secondary | ICD-10-CM | POA: Diagnosis not present

## 2021-12-13 DIAGNOSIS — E119 Type 2 diabetes mellitus without complications: Secondary | ICD-10-CM | POA: Diagnosis not present

## 2021-12-13 DIAGNOSIS — M7989 Other specified soft tissue disorders: Secondary | ICD-10-CM

## 2021-12-13 MED ORDER — FUROSEMIDE 40 MG PO TABS: 40.0000 mg | ORAL_TABLET | Freq: Every day | ORAL | 0 refills | Status: DC

## 2021-12-13 NOTE — Progress Notes (Signed)
Subjective:    Patient ID: Amanda Riddle, female    DOB: 08/20/1935, 87 y.o.   MRN: [id number] Since I last saw the patient, she has gained 8 to 9 pounds.  She has +1 pitting edema all the way up to her knees.  The skin is taut and shiny.  She reports aching pain legs bilaterally.  She denies any chest pain.  She denies any dyspnea on exertion.  She denies any shortness of breath.  She may have stopped taking her hydrochlorothiazide although she is not sure.  She is on amlodipine and Actos.  Past Medical History:  Diagnosis Date   Diabetes mellitus    Diverticulosis    Hypercholesteremia    Hypertension    Past Surgical History:  Procedure Laterality Date   ABDOMINAL HYSTERECTOMY  1986   CATARACT EXTRACTION W/PHACO Right 11/12/2021   Procedure: CATARACT EXTRACTION PHACO AND INTRAOCULAR LENS PLACEMENT (IOC);  Surgeon: Baruch Goldmann, MD;  Location: [facility];  Service: Ophthalmology;  Laterality: Right;  CDE: 11.79   TONSILLECTOMY  1950   Current Outpatient Medications on File Prior to Visit  Medication Sig Dispense Refill   albuterol (VENTOLIN HFA) 108 (90 Base) MCG/ACT inhaler Inhale 2 puffs into the lungs every 6 (six) hours as needed. Shortness of breath 3 each 2   amLODipine (NORVASC) 5 MG tablet Take 1 tablet (5 mg total) by mouth daily. 90 tablet 3   aspirin EC 81 MG tablet Take 81 mg by mouth at bedtime.     atorvastatin (LIPITOR) 40 MG tablet TAKE 1 TABLET (40 MG TOTAL) BY MOUTH DAILY. 90 tablet 2   Blood Glucose Monitoring Suppl (TRUE METRIX METER) w/Device KIT      clotrimazole-betamethasone (LOTRISONE) cream Apply 1 application topically 2 (two) times daily. 30 g 0   Cyanocobalamin (VITAMIN B 12 PO) Take 1 tablet by mouth at bedtime.      diclofenac Sodium (VOLTAREN) 1 % GEL Apply 4 g topically 4 (four) times daily. 50 g 1   Flaxseed, Linseed, (FLAXSEED OIL PO) Take 1 capsule by mouth 2 (two) times daily.      Glucosamine Sulfate-MSM (MSM-GLUCOSAMINE PO) Take 1 capsule by mouth  2 (two) times daily.     hydrochlorothiazide (HYDRODIURIL) 25 MG tablet Take 1 tablet (25 mg total) by mouth daily. 90 tablet 3   insulin isophane & regular human (NOVOLIN 70/30 FLEXPEN) (70-30) 100 UNIT/ML KwikPen INJECT 30  UNITS SUBCUTANEOUSLY IN THE MORNING AND 15 UNITS SUBCUTANEOUSLY IN THE AFTERNOON (Patient taking differently: INJECT 15 UNITS SUBCUTANEOUSLY IN THE MORNING) 15 mL 11   Insulin Syringe-Needle U-100 (B-D INS SYR ULTRAFINE .3CC/31G) 31G X 5/16" 0.3 ML MISC Use as directed to inject insulin SQ 2x daiy. Dx: E11.65 300 each 3   JANUVIA 100 MG tablet Take 1 tablet by mouth daily.     meclizine (ANTIVERT) 12.5 MG tablet TAKE 1 TABLET BY MOUTH 3 TIMES DAILY AS NEEDED FOR DIZZINESS. 40 tablet 0   metFORMIN (GLUCOPHAGE) 1000 MG tablet TAKE 1 TABLET TWICE DAILY 180 tablet 1   pioglitazone (ACTOS) 30 MG tablet Take 1 tablet (30 mg total) by mouth daily. 90 tablet 3   triamcinolone cream (KENALOG) 0.1 % Apply 1 application topically 2 (two) times daily. 30 g 0   TRUE METRIX BLOOD GLUCOSE TEST test strip TEST BLOOD SUGAR THREE TIMES DAILY AS DIRECTED 300 strip 1   TRUEplus Lancets 30G MISC TEST BLOOD SUGAR THREE TIMES DAILY AS DIRECTED 300 each 1  No current facility-administered medications on file prior to visit.   Allergies  Allergen Reactions   Codeine Itching   Lisinopril Swelling   Social History   Socioeconomic History   Marital status: Divorced    Spouse name: Not on file   Number of children: 2   Years of education: Not on file   Highest education level: Not on file  Occupational History   Not on file  Tobacco Use   Smoking status: Never   Smokeless tobacco: Current    Types: Snuff  Vaping Use   Vaping Use: Never used  Substance and Sexual Activity   Alcohol use: No   Drug use: No   Sexual activity: Not Currently  Other Topics Concern   Not on file  Social History Narrative   Not on file   Social Determinants of Health   Financial Resource Strain: Low  Risk    Difficulty of Paying Living Expenses: Not very hard  Food Insecurity: No Food Insecurity   Worried About Running Out of Food in the Last Year: Never true   Ran Out of Food in the Last Year: Never true  Transportation Needs: No Transportation Needs   Lack of Transportation (Medical): No   Lack of Transportation (Non-Medical): No  Physical Activity: Inactive   Days of Exercise per Week: 0 days   Minutes of Exercise per Session: 0 min  Stress: No Stress Concern Present   Feeling of Stress : Not at all  Social Connections: Socially Isolated   Frequency of Communication with Friends and Family: More than three times a week   Frequency of Social Gatherings with Friends and Family: Once a week   Attends Religious Services: Never   Marine scientist or Organizations: No   Attends Archivist Meetings: Never   Marital Status: Widowed  Human resources officer Violence: Not At Risk   Fear of Current or Ex-Partner: No   Emotionally Abused: No   Physically Abused: No   Sexually Abused: No     Review of Systems  All other systems reviewed and are negative.     Objective:   Physical Exam Vitals reviewed.  Constitutional:      Appearance: She is obese.  Cardiovascular:     Rate and Rhythm: Normal rate and regular rhythm.     Heart sounds: Normal heart sounds. No murmur heard.   No friction rub. No gallop.  Pulmonary:     Effort: Pulmonary effort is normal. No respiratory distress.     Breath sounds: Normal breath sounds. No stridor. No wheezing, rhonchi or rales.  Chest:     Chest wall: No tenderness.  Musculoskeletal:     Right lower leg: Edema present.     Left lower leg: Edema present.  Neurological:     Mental Status: She is alert.          Assessment & Plan:  Leg swelling - Plan: CBC with Differential/Platelet, Brain natriuretic peptide, COMPLETE METABOLIC PANEL WITH GFR  Type 2 diabetes mellitus without complication, with long-term current use of  insulin ([facility]) - Plan: Hemoglobin A1c Temporarily discontinue hydrochlorothiazide.  Temporarily discontinue Actos.  Check A1c along with CMP and CBC.  Check BMP.  Start Lasix 40 mg daily.  Reassess on Monday.  Schedule echocardiogram of the heart given age, and risk factors including diabetes, hypertension.

## 2021-12-17 ENCOUNTER — Ambulatory Visit (INDEPENDENT_AMBULATORY_CARE_PROVIDER_SITE_OTHER): Payer: Medicare Other | Admitting: Family Medicine

## 2021-12-17 ENCOUNTER — Other Ambulatory Visit: Payer: Self-pay

## 2021-12-17 ENCOUNTER — Encounter: Payer: Self-pay | Admitting: Family Medicine

## 2021-12-17 VITALS — BP 132/72 | HR 76 | Temp 97.0°F | Resp 18 | Ht 65.0 in | Wt 214.0 lb

## 2021-12-17 DIAGNOSIS — M7989 Other specified soft tissue disorders: Secondary | ICD-10-CM

## 2021-12-17 DIAGNOSIS — I509 Heart failure, unspecified: Secondary | ICD-10-CM

## 2021-12-17 NOTE — Progress Notes (Signed)
Subjective:    Patient ID: Amanda Riddle, female    DOB: [birth year], 87 y.o.   MRN: [id number] 12/13/21 Since I last saw the patient, she has gained 8 to 9 pounds.  She has +1 pitting edema all the way up to her knees.  The skin is taut and shiny.  She reports aching pain legs bilaterally.  She denies any chest pain.  She denies any dyspnea on exertion.  She denies any shortness of breath.  She may have stopped taking her hydrochlorothiazide although she is not sure.  She is on amlodipine and Actos.  At that time, my plan was: Temporarily discontinue hydrochlorothiazide.  Temporarily discontinue Actos.  Check A1c along with CMP and CBC.  Check BMP.  Start Lasix 40 mg daily.  Reassess on Monday.  Schedule echocardiogram of the heart given age, and risk factors including diabetes, hypertension.  12/17/21  Patient has lost 5 pounds since I last saw her.  Swelling in her legs is much better.  Come to find out she had not been taking hydrochlorothiazide however the swelling in her legs is improving dramatically on the Lasix.  She is still holding the Actos.  She states that her fasting blood sugars are under 140.  Past Medical History:  Diagnosis Date   Diabetes mellitus    Diverticulosis    Hypercholesteremia    Hypertension    Past Surgical History:  Procedure Laterality Date   ABDOMINAL HYSTERECTOMY  1986   CATARACT EXTRACTION W/PHACO Right 11/12/2021   Procedure: CATARACT EXTRACTION PHACO AND INTRAOCULAR LENS PLACEMENT (IOC);  Surgeon: Baruch Goldmann, MD;  Location: [facility];  Service: Ophthalmology;  Laterality: Right;  CDE: 11.79   TONSILLECTOMY  1950   Current Outpatient Medications on File Prior to Visit  Medication Sig Dispense Refill   albuterol (VENTOLIN HFA) 108 (90 Base) MCG/ACT inhaler Inhale 2 puffs into the lungs every 6 (six) hours as needed. Shortness of breath 3 each 2   amLODipine (NORVASC) 5 MG tablet Take 1 tablet (5 mg total) by mouth daily. 90 tablet 3   aspirin EC 81 MG  tablet Take 81 mg by mouth at bedtime.     atorvastatin (LIPITOR) 40 MG tablet TAKE 1 TABLET (40 MG TOTAL) BY MOUTH DAILY. 90 tablet 2   Blood Glucose Monitoring Suppl (TRUE METRIX METER) w/Device KIT      clotrimazole-betamethasone (LOTRISONE) cream Apply 1 application topically 2 (two) times daily. 30 g 0   Cyanocobalamin (VITAMIN B 12 PO) Take 1 tablet by mouth at bedtime.      diclofenac Sodium (VOLTAREN) 1 % GEL Apply 4 g topically 4 (four) times daily. 50 g 1   Flaxseed, Linseed, (FLAXSEED OIL PO) Take 1 capsule by mouth 2 (two) times daily.      furosemide (LASIX) 40 MG tablet Take 1 tablet (40 mg total) by mouth daily. 30 tablet 0   Glucosamine Sulfate-MSM (MSM-GLUCOSAMINE PO) Take 1 capsule by mouth 2 (two) times daily.     hydrochlorothiazide (HYDRODIURIL) 25 MG tablet Take 1 tablet (25 mg total) by mouth daily. 90 tablet 3   insulin isophane & regular human (NOVOLIN 70/30 FLEXPEN) (70-30) 100 UNIT/ML KwikPen INJECT 30  UNITS SUBCUTANEOUSLY IN THE MORNING AND 15 UNITS SUBCUTANEOUSLY IN THE AFTERNOON (Patient taking differently: INJECT 15 UNITS SUBCUTANEOUSLY IN THE MORNING) 15 mL 11   Insulin Syringe-Needle U-100 (B-D INS SYR ULTRAFINE .3CC/31G) 31G X 5/16" 0.3 ML MISC Use as directed to inject insulin SQ 2x daiy. Dx:  E11.65 300 each 3   JANUVIA 100 MG tablet Take 1 tablet by mouth daily.     meclizine (ANTIVERT) 12.5 MG tablet TAKE 1 TABLET BY MOUTH 3 TIMES DAILY AS NEEDED FOR DIZZINESS. 40 tablet 0   metFORMIN (GLUCOPHAGE) 1000 MG tablet TAKE 1 TABLET TWICE DAILY 180 tablet 1   pioglitazone (ACTOS) 30 MG tablet Take 1 tablet (30 mg total) by mouth daily. 90 tablet 3   triamcinolone cream (KENALOG) 0.1 % Apply 1 application topically 2 (two) times daily. 30 g 0   TRUE METRIX BLOOD GLUCOSE TEST test strip TEST BLOOD SUGAR THREE TIMES DAILY AS DIRECTED 300 strip 1   TRUEplus Lancets 30G MISC TEST BLOOD SUGAR THREE TIMES DAILY AS DIRECTED 300 each 1   No current facility-administered  medications on file prior to visit.   Allergies  Allergen Reactions   Codeine Itching   Lisinopril Swelling   Social History   Socioeconomic History   Marital status: Divorced    Spouse name: Not on file   Number of children: 2   Years of education: Not on file   Highest education level: Not on file  Occupational History   Not on file  Tobacco Use   Smoking status: Never   Smokeless tobacco: Current    Types: Snuff  Vaping Use   Vaping Use: Never used  Substance and Sexual Activity   Alcohol use: No   Drug use: No   Sexual activity: Not Currently  Other Topics Concern   Not on file  Social History Narrative   Not on file   Social Determinants of Health   Financial Resource Strain: Low Risk    Difficulty of Paying Living Expenses: Not very hard  Food Insecurity: No Food Insecurity   Worried About Running Out of Food in the Last Year: Never true   Ran Out of Food in the Last Year: Never true  Transportation Needs: No Transportation Needs   Lack of Transportation (Medical): No   Lack of Transportation (Non-Medical): No  Physical Activity: Inactive   Days of Exercise per Week: 0 days   Minutes of Exercise per Session: 0 min  Stress: No Stress Concern Present   Feeling of Stress : Not at all  Social Connections: Socially Isolated   Frequency of Communication with Friends and Family: More than three times a week   Frequency of Social Gatherings with Friends and Family: Once a week   Attends Religious Services: Never   Marine scientist or Organizations: No   Attends Archivist Meetings: Never   Marital Status: Widowed  Human resources officer Violence: Not At Risk   Fear of Current or Ex-Partner: No   Emotionally Abused: No   Physically Abused: No   Sexually Abused: No     Review of Systems  All other systems reviewed and are negative.     Objective:   Physical Exam Vitals reviewed.  Constitutional:      Appearance: She is obese.   Cardiovascular:     Rate and Rhythm: Normal rate and regular rhythm.     Heart sounds: Normal heart sounds. No murmur heard.   No friction rub. No gallop.  Pulmonary:     Effort: Pulmonary effort is normal. No respiratory distress.     Breath sounds: Normal breath sounds. No stridor. No wheezing, rhonchi or rales.  Chest:     Chest wall: No tenderness.  Musculoskeletal:     Right lower leg: No edema.  Left lower leg: No edema.  Neurological:     Mental Status: She is alert.          Assessment & Plan:  Leg swelling - Plan: BASIC METABOLIC PANEL WITH GFR, Brain natriuretic peptide  Acute congestive heart failure, unspecified heart failure type ([place]) - Plan: BASIC METABOLIC PANEL WITH GFR, Brain natriuretic peptide Patient appears euvolemic today.  Her weight is not back to her baseline but is improving dramatically.  Lets continue Lasix for the time being and discontinue hydrochlorothiazide.  Blood pressure is acceptable.  We will continue to hold pioglitazone however if fasting sugars rise above 140 he will need to resume.  Hopefully continuing the Lasix with the pioglitazone to mitigate some of the swelling

## 2021-12-18 ENCOUNTER — Other Ambulatory Visit: Payer: Self-pay

## 2021-12-18 LAB — BASIC METABOLIC PANEL WITH GFR
BUN: 9 mg/dL (ref 7–25)
CO2: 24 mmol/L (ref 20–32)
Calcium: 9.3 mg/dL (ref 8.6–10.4)
Chloride: 102 mmol/L (ref 98–110)
Creat: 0.83 mg/dL (ref 0.60–0.95)
Glucose, Bld: 97 mg/dL (ref 65–99)
Potassium: 3.4 mmol/L — ABNORMAL LOW (ref 3.5–5.3)
Sodium: 141 mmol/L (ref 135–146)
eGFR: 69 mL/min/{1.73_m2} (ref >=60)

## 2021-12-18 LAB — BRAIN NATRIURETIC PEPTIDE: Brain Natriuretic Peptide: 12 pg/mL (ref <100)

## 2021-12-18 MED ORDER — POTASSIUM CHLORIDE ER 10 MEQ PO TBCR
10.0000 meq | EXTENDED_RELEASE_TABLET | Freq: Every day | ORAL | 3 refills | Status: DC
Start: 2021-12-18 — End: 2022-12-10

## 2021-12-20 ENCOUNTER — Ambulatory Visit (INDEPENDENT_AMBULATORY_CARE_PROVIDER_SITE_OTHER): Payer: Medicare Other | Admitting: Family Medicine

## 2021-12-20 ENCOUNTER — Other Ambulatory Visit: Payer: Self-pay

## 2021-12-20 ENCOUNTER — Encounter: Payer: Self-pay | Admitting: Family Medicine

## 2021-12-20 VITALS — BP 112/68 | HR 79 | Temp 97.2°F | Resp 18 | Ht 65.0 in | Wt 213.0 lb

## 2021-12-20 DIAGNOSIS — Z794 Long term (current) use of insulin: Secondary | ICD-10-CM

## 2021-12-20 DIAGNOSIS — M7989 Other specified soft tissue disorders: Secondary | ICD-10-CM | POA: Diagnosis not present

## 2021-12-20 DIAGNOSIS — E119 Type 2 diabetes mellitus without complications: Secondary | ICD-10-CM | POA: Diagnosis not present

## 2021-12-20 MED ORDER — ALBUTEROL SULFATE HFA 108 (90 BASE) MCG/ACT IN AERS: 2.0000 | INHALATION_SPRAY | Freq: Four times a day (QID) | RESPIRATORY_TRACT | 2 refills | Status: DC | PRN

## 2021-12-20 NOTE — Progress Notes (Signed)
Subjective:    Patient ID: Amanda Riddle, female    DOB: [birth year], 87 y.o.   MRN: [id number] 12/13/21 Since I last saw the patient, she has gained 8 to 9 pounds.  She has +1 pitting edema all the way up to her knees.  The skin is taut and shiny.  She reports aching pain legs bilaterally.  She denies any chest pain.  She denies any dyspnea on exertion.  She denies any shortness of breath.  She may have stopped taking her hydrochlorothiazide although she is not sure.  She is on amlodipine and Actos.  At that time, my plan was: Temporarily discontinue hydrochlorothiazide.  Temporarily discontinue Actos.  Check A1c along with CMP and CBC.  Check BMP.  Start Lasix 40 mg daily.  Reassess on Monday.  Schedule echocardiogram of the heart given age, and risk factors including diabetes, hypertension.  12/17/21 Patient has lost 5 pounds since I last saw her.  Swelling in her legs is much better.  Come to find out she had not been taking hydrochlorothiazide however the swelling in her legs is improving dramatically on the Lasix.  She is still holding the Actos.  She states that her fasting blood sugars are under 140.  At that time, my plan was: Patient appears euvolemic today.  Her weight is not back to her baseline but is improving dramatically.  Lets continue Lasix for the time being and discontinue hydrochlorothiazide.  Blood pressure is acceptable.  We will continue to hold pioglitazone however if fasting sugars rise above 140 he will need to resume.  Hopefully continuing the Lasix with the pioglitazone to mitigate some of the swelling  12/20/21 Office Visit on 12/17/2021  Component Date Value Ref Range Status   Glucose, Bld 12/17/2021 97  65 - 99 mg/dL Final   Comment: .            Fasting reference interval .    BUN 12/17/2021 9  7 - 25 mg/dL Final   Creat 12/17/2021 0.83  0.60 - 0.95 mg/dL Final   eGFR 12/17/2021 69  > OR = 60 mL/min/1.51m Final   Comment: The eGFR is based on the CKD-EPI 2021  equation. To calculate  the new eGFR from a previous Creatinine or Cystatin C result, go to https://www.kidney.org/professionals/ kdoqi/gfr%[email or web address]    BUN/Creatinine Ratio 032/99/2426NOT APPLICABLE  6 - 22 (calc) Final   Sodium 12/17/2021 141  135 - 146 mmol/L Final   Potassium 12/17/2021 3.4 (L)  3.5 - 5.3 mmol/L Final   Chloride 12/17/2021 102  98 - 110 mmol/L Final   CO2 12/17/2021 24  20 - 32 mmol/L Final   Calcium 12/17/2021 9.3  8.6 - 10.4 mg/dL Final   Brain Natriuretic Peptide 12/17/2021 12  <100 pg/mL Final   Comment: . BNP levels increase with age in the general population with the highest values seen in individuals greater than 750years of age. Reference: J. Am. [facility] Ar Cardiol. 2002;; 83:419-622 .    12/20/21 Patient is doing well on Lasix.  She still has trace bipedal edema but this is much better than before.  She denies any chest pain or shortness of breath.  She just started KDur 10 mg once daily due to her hypokalemia on her recent lab work.  Unfortunately her fasting blood sugars have now risen to over 140 after stopping pioglitazone.  She does not want to take more insulin.  She has a difficult time affording test strips and hates to prick  her finger and take shots.  She has an echocardiogram scheduled.  However reassuringly, her BNP was normal.  Past Medical History:  Diagnosis Date   Diabetes mellitus    Diverticulosis    Hypercholesteremia    Hypertension    Past Surgical History:  Procedure Laterality Date   ABDOMINAL HYSTERECTOMY  1986   CATARACT EXTRACTION W/PHACO Right 11/12/2021   Procedure: CATARACT EXTRACTION PHACO AND INTRAOCULAR LENS PLACEMENT (IOC);  Surgeon: Baruch Goldmann, MD;  Location: [facility];  Service: Ophthalmology;  Laterality: Right;  CDE: 11.79   TONSILLECTOMY  1950   Current Outpatient Medications on File Prior to Visit  Medication Sig Dispense Refill   albuterol (VENTOLIN HFA) 108 (90 Base) MCG/ACT inhaler Inhale 2 puffs into the lungs  every 6 (six) hours as needed. Shortness of breath 3 each 2   amLODipine (NORVASC) 5 MG tablet Take 1 tablet (5 mg total) by mouth daily. 90 tablet 3   aspirin EC 81 MG tablet Take 81 mg by mouth at bedtime.     atorvastatin (LIPITOR) 40 MG tablet TAKE 1 TABLET (40 MG TOTAL) BY MOUTH DAILY. 90 tablet 2   Blood Glucose Monitoring Suppl (TRUE METRIX METER) w/Device KIT      clotrimazole-betamethasone (LOTRISONE) cream Apply 1 application topically 2 (two) times daily. 30 g 0   Cyanocobalamin (VITAMIN B 12 PO) Take 1 tablet by mouth at bedtime.      diclofenac Sodium (VOLTAREN) 1 % GEL Apply 4 g topically 4 (four) times daily. 50 g 1   Flaxseed, Linseed, (FLAXSEED OIL PO) Take 1 capsule by mouth 2 (two) times daily.      furosemide (LASIX) 40 MG tablet Take 1 tablet (40 mg total) by mouth daily. 30 tablet 0   Glucosamine Sulfate-MSM (MSM-GLUCOSAMINE PO) Take 1 capsule by mouth 2 (two) times daily.     insulin isophane & regular human (NOVOLIN 70/30 FLEXPEN) (70-30) 100 UNIT/ML KwikPen INJECT 30  UNITS SUBCUTANEOUSLY IN THE MORNING AND 15 UNITS SUBCUTANEOUSLY IN THE AFTERNOON (Patient taking differently: INJECT 15 UNITS SUBCUTANEOUSLY IN THE MORNING) 15 mL 11   Insulin Syringe-Needle U-100 (B-D INS SYR ULTRAFINE .3CC/31G) 31G X 5/16" 0.3 ML MISC Use as directed to inject insulin SQ 2x daiy. Dx: E11.65 300 each 3   JANUVIA 100 MG tablet Take 1 tablet by mouth daily.     meclizine (ANTIVERT) 12.5 MG tablet TAKE 1 TABLET BY MOUTH 3 TIMES DAILY AS NEEDED FOR DIZZINESS. 40 tablet 0   metFORMIN (GLUCOPHAGE) 1000 MG tablet TAKE 1 TABLET TWICE DAILY 180 tablet 1   potassium chloride (KLOR-CON) 10 MEQ tablet Take 1 tablet (10 mEq total) by mouth daily. 90 tablet 3   triamcinolone cream (KENALOG) 0.1 % Apply 1 application topically 2 (two) times daily. 30 g 0   TRUE METRIX BLOOD GLUCOSE TEST test strip TEST BLOOD SUGAR THREE TIMES DAILY AS DIRECTED 300 strip 1   TRUEplus Lancets 30G MISC TEST BLOOD SUGAR THREE  TIMES DAILY AS DIRECTED 300 each 1   No current facility-administered medications on file prior to visit.   Allergies  Allergen Reactions   Codeine Itching   Lisinopril Swelling   Social History   Socioeconomic History   Marital status: Divorced    Spouse name: Not on file   Number of children: 2   Years of education: Not on file   Highest education level: Not on file  Occupational History   Not on file  Tobacco Use   Smoking status: Never  Smokeless tobacco: Current    Types: Snuff  Vaping Use   Vaping Use: Never used  Substance and Sexual Activity   Alcohol use: No   Drug use: No   Sexual activity: Not Currently  Other Topics Concern   Not on file  Social History Narrative   Not on file   Social Determinants of Health   Financial Resource Strain: Low Risk    Difficulty of Paying Living Expenses: Not very hard  Food Insecurity: No Food Insecurity   Worried About Running Out of Food in the Last Year: Never true   Ran Out of Food in the Last Year: Never true  Transportation Needs: No Transportation Needs   Lack of Transportation (Medical): No   Lack of Transportation (Non-Medical): No  Physical Activity: Inactive   Days of Exercise per Week: 0 days   Minutes of Exercise per Session: 0 min  Stress: No Stress Concern Present   Feeling of Stress : Not at all  Social Connections: Socially Isolated   Frequency of Communication with Friends and Family: More than three times a week   Frequency of Social Gatherings with Friends and Family: Once a week   Attends Religious Services: Never   Marine scientist or Organizations: No   Attends Archivist Meetings: Never   Marital Status: Widowed  Human resources officer Violence: Not At Risk   Fear of Current or Ex-Partner: No   Emotionally Abused: No   Physically Abused: No   Sexually Abused: No     Review of Systems  All other systems reviewed and are negative.     Objective:   Physical Exam Vitals  reviewed.  Constitutional:      Appearance: She is obese.  Cardiovascular:     Rate and Rhythm: Normal rate and regular rhythm.     Heart sounds: Normal heart sounds. No murmur heard.   No friction rub. No gallop.  Pulmonary:     Effort: Pulmonary effort is normal. No respiratory distress.     Breath sounds: Normal breath sounds. No stridor. No wheezing, rhonchi or rales.  Chest:     Chest wall: No tenderness.  Musculoskeletal:     Right lower leg: No edema.     Left lower leg: No edema.  Neurological:     Mental Status: She is alert.          Assessment & Plan:  Leg swelling  Type 2 diabetes mellitus without complication, with long-term current use of insulin ([place]) Patient appears euvolemic however I will obtain an echocardiogram just to ensure that there is no evidence of congestive heart failure.  That being said I am reassured by her normal BNP.  We discussed increasing insulin versus resuming pioglitazone.  The patient elects to resume pioglitazone but she will stay on Lasix 40 mg a day with potassium supplement daily.

## 2021-12-21 ENCOUNTER — Other Ambulatory Visit: Payer: Self-pay

## 2021-12-21 ENCOUNTER — Ambulatory Visit (HOSPITAL_COMMUNITY): Payer: Medicare Other | Attending: Cardiology

## 2021-12-21 DIAGNOSIS — M7989 Other specified soft tissue disorders: Secondary | ICD-10-CM | POA: Diagnosis not present

## 2021-12-21 DIAGNOSIS — E119 Type 2 diabetes mellitus without complications: Secondary | ICD-10-CM | POA: Insufficient documentation

## 2021-12-21 DIAGNOSIS — Z794 Long term (current) use of insulin: Secondary | ICD-10-CM | POA: Insufficient documentation

## 2021-12-21 DIAGNOSIS — I509 Heart failure, unspecified: Secondary | ICD-10-CM | POA: Diagnosis not present

## 2021-12-21 LAB — ECHOCARDIOGRAM COMPLETE
Area-P 1/2: 3.03 cm2
S' Lateral: 2.4 cm

## 2021-12-31 ENCOUNTER — Inpatient Hospital Stay: Admission: RE | Admit: 2021-12-31 | Payer: Medicare HMO | Source: Ambulatory Visit

## 2022-01-03 ENCOUNTER — Telehealth: Payer: Self-pay | Admitting: Pharmacist

## 2022-01-03 NOTE — Progress Notes (Signed)
? ? ?Chronic Care Management ?Pharmacy Assistant  ? ?Name: Amanda Riddle  MRN: [id number] DOB: 1934-11-08 ? ? ?Reason for Encounter: Disease State - General Adherence Call  ?  ? ? ?Recent office visits:  ?12/20/20 Jenna Luo, MD - Family Medicine - Leg Swelling - ECHO ordered. Resume pioglitazone but she will stay on Lasix 40 mg a day with potassium supplement daily. Follow up as scheduled.  ? ?12/17/21 Jenna Luo, MD - Family Medicine - Leg Swelling - Labs were ordered. continue Lasix for the time being and discontinue hydrochlorothiazide.  Blood pressure is acceptable.  We will continue to hold pioglitazone however if fasting sugars rise above 140 he will need to resume. Follow up as scheduled.  ? ?12/13/21 Jenna Luo, MD - Family Medicine - Leg Swelling - Labs were ordered. ECHO was ordered. Furosemide (LASIX) 40 MG tablet 1 tablet daily prescribed. Temporarily discontinue hydrochlorothiazide. Temporarily discontinue Actos.  Start Lasix 40 mg daily.  Reassess on Monday.  Schedule echocardiogram of the heart given age, and risk factors including diabetes, hypertension. Follow up as scheduled.  ? ?08/20/21 Jenna Luo, MD - Family Medicine - Hyperlipidemia - No notes available.  ? ?08/13/21 Noemi Chapel, NP - Family Medicine - Acute pain of right knee - Referral to PT. Diclofenac Sodium (VOLTAREN) 1 % GEL prescribed. Follow up as needed.  ? ? ?Recent consult visits:  ?None noted.  ? ? ?Hospital visits: 11/12/21 ?Medication Reconciliation was completed by comparing discharge summary, patient?s EMR and Pharmacy list, and upon discussion with patient. ? ?Admitted to the hospital on 11/12/21 due to Cardiac Cath. Discharge date was 11/12/21. Discharged from [facility].   ? ?New?Medications Started at [facility] Discharge:?? ?None noted.  ? ?Medication Changes at Hospital Discharge: ?None noted.  ? ?Medications Discontinued at Hospital Discharge: ?None noted. ? ?Medications that remain the same after  Hospital Discharge:??  ?All other medications will remain the same.   ? ?Medications: ?Outpatient Encounter Medications as of 01/03/2022  ?Medication Sig  ? albuterol (VENTOLIN HFA) 108 (90 Base) MCG/ACT inhaler Inhale 2 puffs into the lungs every 6 (six) hours as needed. Shortness of breath  ? amLODipine (NORVASC) 5 MG tablet Take 1 tablet (5 mg total) by mouth daily.  ? aspirin EC 81 MG tablet Take 81 mg by mouth at bedtime.  ? atorvastatin (LIPITOR) 40 MG tablet TAKE 1 TABLET (40 MG TOTAL) BY MOUTH DAILY.  ? Blood Glucose Monitoring Suppl (TRUE METRIX METER) w/Device KIT   ? clotrimazole-betamethasone (LOTRISONE) cream Apply 1 application topically 2 (two) times daily.  ? Cyanocobalamin (VITAMIN B 12 PO) Take 1 tablet by mouth at bedtime.   ? diclofenac Sodium (VOLTAREN) 1 % GEL Apply 4 g topically 4 (four) times daily.  ? Flaxseed, Linseed, (FLAXSEED OIL PO) Take 1 capsule by mouth 2 (two) times daily.   ? furosemide (LASIX) 40 MG tablet Take 1 tablet (40 mg total) by mouth daily.  ? Glucosamine Sulfate-MSM (MSM-GLUCOSAMINE PO) Take 1 capsule by mouth 2 (two) times daily.  ? insulin isophane & regular human (NOVOLIN 70/30 FLEXPEN) (70-30) 100 UNIT/ML KwikPen INJECT 30  UNITS SUBCUTANEOUSLY IN THE MORNING AND 15 UNITS SUBCUTANEOUSLY IN THE AFTERNOON (Patient taking differently: INJECT 15 UNITS SUBCUTANEOUSLY IN THE MORNING)  ? Insulin Syringe-Needle U-100 (B-D INS SYR ULTRAFINE .3CC/31G) 31G X 5/16" 0.3 ML MISC Use as directed to inject insulin SQ 2x daiy. Dx: E11.65  ? JANUVIA 100 MG tablet Take 1 tablet by mouth daily.  ? meclizine (ANTIVERT)  12.5 MG tablet TAKE 1 TABLET BY MOUTH 3 TIMES DAILY AS NEEDED FOR DIZZINESS.  ? metFORMIN (GLUCOPHAGE) 1000 MG tablet TAKE 1 TABLET TWICE DAILY  ? pioglitazone (ACTOS) 30 MG tablet Take 30 mg by mouth daily.  ? potassium chloride (KLOR-CON) 10 MEQ tablet Take 1 tablet (10 mEq total) by mouth daily.  ? triamcinolone cream (KENALOG) 0.1 % Apply 1 application topically 2 (two)  times daily.  ? TRUE METRIX BLOOD GLUCOSE TEST test strip TEST BLOOD SUGAR THREE TIMES DAILY AS DIRECTED  ? TRUEplus Lancets 30G MISC TEST BLOOD SUGAR THREE TIMES DAILY AS DIRECTED  ? ?No facility-administered encounter medications on file as of 01/03/2022.  ? ? ?Have you had any problems recently with your health? ?Patient reported her swelling has improved and is now gone. She reported she is doing much better currently.  ? ?Have you had any problems with your pharmacy? ?Patient denied having any issues with her current pharmacy. She reported she has 4 Furosemide pills left and will be requesting a refill of that today.  ? ?What issues or side effects are you having with your medications? ?Patient denied any issues or side effects with her current medications.  ? ?What would you like me to pass along to Leata Mouse, CPP for them to help you with?  ?Patient did not have anything additional to pass along to CPP at this time.  ? ?What can we do to take care of you better? ?Patient did not have any recommendations at this time. She thanked me for the follow up.  ? ?Care Gaps ? ?AWV: done 03/09/21 ?Colonoscopy: done 03/30/13 ?DM Eye Exam: done 04/24/22 ?DM Foot Exam: done 05/17/22 ?Microalbumin: done 05/17/21 ?HbgAIC: 05/17/21 (6.4) ?DEXA: done 10/30/10 (ordered) ?Mammogram: done 07/19/20 (ordered) ? ? ?Star Rating Drugs: ?pioglitazone (ACTOS) 30 MG tablet - last filled 11/19/21 100 days  ?metFORMIN (GLUCOPHAGE) 1000 MG tablet - last filled 11/21/21 100 days  ?JANUVIA 100 MG tablet - last filled 06/08/21 90 days  ?atorvastatin (LIPITOR) 40 MG tablet - last filled 11/19/21 100 days  ? ? ?Future Appointments  ?Date Time Provider [place]  ?02/07/2022  3:45 PM [facility] PHARMACIST [facility]  ?03/22/2022  8:15 AM Pickard, Cammie Mcgee, MD [facility]  ?03/26/2022 10:00 AM Paseda, Dewaine Conger, FNP [facility]  ? ? ? ?Liza Showfety, CCMA ?Clinical Pharmacist Assistant  ?[phone] ? ?

## 2022-01-04 ENCOUNTER — Other Ambulatory Visit: Payer: Self-pay | Admitting: Family Medicine

## 2022-01-13 ENCOUNTER — Other Ambulatory Visit: Payer: Self-pay | Admitting: Nurse Practitioner

## 2022-01-15 ENCOUNTER — Other Ambulatory Visit: Payer: Self-pay

## 2022-01-29 ENCOUNTER — Encounter: Payer: Self-pay | Admitting: Podiatry

## 2022-01-29 ENCOUNTER — Ambulatory Visit: Payer: Medicare Other | Admitting: Podiatry

## 2022-01-29 DIAGNOSIS — B351 Tinea unguium: Secondary | ICD-10-CM | POA: Diagnosis not present

## 2022-01-29 DIAGNOSIS — Z794 Long term (current) use of insulin: Secondary | ICD-10-CM | POA: Diagnosis not present

## 2022-01-29 DIAGNOSIS — M79674 Pain in right toe(s): Secondary | ICD-10-CM

## 2022-01-29 DIAGNOSIS — E119 Type 2 diabetes mellitus without complications: Secondary | ICD-10-CM | POA: Diagnosis not present

## 2022-01-29 DIAGNOSIS — M79675 Pain in left toe(s): Secondary | ICD-10-CM

## 2022-01-29 NOTE — Progress Notes (Signed)
? ?Chronic Care Management ?Pharmacy Note ? ?02/07/2022 ?Name:  Amanda Riddle MRN:  [id number] DOB:  Jan 15, 1935 ? ?Summary: ?PharmD FU.  Recommend fasting labs (A1c and Lipids) at upcoming June FU visit. ?No changes needed at this time. ? ? ?Subjective: ?Amanda Riddle is an 87 y.o. year old female who is a primary patient of Pickard, Cammie Mcgee, MD.  The CCM team was consulted for assistance with disease management and care coordination needs.   ? ?Engaged with patient by telephone for initial visit in response to provider referral for pharmacy case management and/or care coordination services.  ? ?Consent to Services:  ?The patient was given the following information about Chronic Care Management services today, agreed to services, and gave verbal consent: 1. CCM service includes personalized support from designated clinical staff supervised by the primary care provider, including individualized plan of care and coordination with other care providers 2. 24/7 contact phone numbers for assistance for urgent and routine care needs. 3. Service will only be billed when office clinical staff spend 20 minutes or more in a month to coordinate care. 4. Only one practitioner may furnish and bill the service in a calendar month. 5.The patient may stop CCM services at any time (effective at the end of the month) by phone call to the office staff. 6. The patient will be responsible for cost sharing (co-pay) of up to 20% of the service fee (after annual deductible is met). Patient agreed to services and consent obtained. ? ?Patient Care Team: ?Susy Frizzle, MD as PCP - General (Family Medicine) ?[name], [facility] as Pharmacist (Pharmacist) ? ?Recent office visits:  ?07/02/21 (Pickard) - FU from hospital recovering well from [place], patient to watch for rebound symptoms. ?03/01/21 (Telephone) Dr. Dennard Schaumann Per note: Resume Novolin 70/30 15U SQ Q AM. ?02/15/21 Dr. Dennard Schaumann For follow-up. STARTED Sitagliptin Phosphate 100 mg  daily. ?02/08/21 Dr. Dennard Schaumann For Bru[place]. Per note:  Dr. Dennard Schaumann recommended she stop her insulin and record her fasting blood sugars every morning and her 2-hour postprandial sugars every evening.  Report those values to me in 1 week.  If her morning sugars are between 101 130 and her 2-hour postprandial sugars are between 101 180, I will not start her back on insulin ?02/07/21 (Telephone) Dr. Dennard Schaumann Per note: Decrease 70/30 to 20 in the am and 7 in the pm.   ?01/22/21 Eulogio Bear, NP. For follow-up. No medication changes. ?11/16/20 Dr. Dennard Schaumann For follow-up. Per note: stop hydrochlorothiazide and increase amlodipine to 10 mg a day. ?  ?Recent consult visits:  ?None in the last six months ?  ?Hospital visits:  ?None in the last six months ?  ?Medication History: ?Januvia 100 mg 30 DS 02/15/21 ?Pioglitazone 30 mg 90 DS 02/06/21 ?Atorvastatin 50 mg 90 DS 02/02/21 ?Metformin 1000 mg 90 DS 01/16/21 ? ?Objective: ? ?Lab Results  ?Component Value Date  ? CREATININE 0.83 12/17/2021  ? BUN 9 12/17/2021  ? GFRNONAA 52 (L) 06/27/2021  ? GFRAA 58 (L) 02/08/2021  ? NA 141 12/17/2021  ? K 3.4 (L) 12/17/2021  ? CALCIUM 9.3 12/17/2021  ? CO2 24 12/17/2021  ? GLUCOSE 97 12/17/2021  ? ? ?Lab Results  ?Component Value Date/Time  ? HGBA1C 6.4 (H) 05/17/2021 12:16 PM  ? HGBA1C 7.2 (H) 11/16/2020 09:26 AM  ? MICROALBUR 0.4 05/17/2021 12:16 PM  ? MICROALBUR 1.4 10/26/2020 08:29 AM  ?  ?Last diabetic Eye exam:  ?Lab Results  ?Component Value Date/Time  ? HMDIABEYEEXA  No Retinopathy 04/24/2021 11:54 AM  ?  ?Last diabetic Foot exam: No results found for: HMDIABFOOTEX  ? ?Lab Results  ?Component Value Date  ? CHOL 184 11/16/2020  ? HDL 74 11/16/2020  ? Yaak 87 11/16/2020  ? TRIG 132 11/16/2020  ? CHOLHDL 2.5 11/16/2020  ? ? ? ?  Latest Ref Rng & Units 05/17/2021  ? 12:16 PM 02/08/2021  ?  4:23 PM 11/16/2020  ?  9:26 AM  ?Hepatic Function  ?Total Protein 6.1 - 8.1 g/dL 6.7   6.3   6.4    ?AST 10 - 35 U/L '17   13   16    ' ?ALT 6 - 29 U/L '11    7   13    ' ?Total Bilirubin 0.2 - 1.2 mg/dL 1.2   0.8   1.0    ? ? ?No results found for: TSH, FREET4 ? ? ?  Latest Ref Rng & Units 06/27/2021  ? 10:41 AM 02/08/2021  ?  4:23 PM 08/17/2020  ?  9:04 AM  ?CBC  ?WBC 4.0 - 10.5 K/uL 5.9   6.3   5.2    ?Hemoglobin 12.0 - 15.0 g/dL 11.7   12.3   11.6    ?Hematocrit 36.0 - 46.0 % 35.5   38.6   36.3    ?Platelets 150 - 400 K/uL 245   265   269    ? ? ?No results found for: VD25OH ? ?Clinical ASCVD: No  ?The ASCVD Risk score (Arnett DK, et al., 2019) failed to calculate for the following reasons: ?  The 2019 ASCVD risk score is only valid for ages 29 to 24   ? ? ?  03/09/2021  ? 10:07 AM 10/25/2020  ?  9:29 AM 11/30/2019  ?  8:53 AM  ?Depression screen PHQ 2/9  ?Decreased Interest 2 0 0  ?Down, Depressed, Hopeless 1 0 0  ?PHQ - 2 Score 3 0 0  ?Altered sleeping 3    ?Tired, decreased energy 2    ?Change in appetite 3    ?Feeling bad or failure about yourself  0    ?Trouble concentrating 0    ?Moving slowly or fidgety/restless 0    ?Suicidal thoughts 0    ?PHQ-9 Score 11    ?Difficult doing work/chores Somewhat difficult    ?  ? ? ?Social History  ? ?Tobacco Use  ?Smoking Status Never  ?Smokeless Tobacco Current  ? Types: Snuff  ? ?BP Readings from Last 3 Encounters:  ?12/20/21 112/68  ?12/17/21 132/72  ?12/13/21 138/78  ? ?Pulse Readings from Last 3 Encounters:  ?12/20/21 79  ?12/17/21 76  ?12/13/21 90  ? ?Wt Readings from Last 3 Encounters:  ?12/20/21 213 lb (96.6 kg)  ?12/17/21 214 lb (97.1 kg)  ?12/13/21 219 lb (99.3 kg)  ? ?BMI Readings from Last 3 Encounters:  ?12/20/21 35.45 kg/m?  ?12/17/21 35.61 kg/m?  ?12/13/21 36.44 kg/m?  ? ? ?Assessment/Interventions: Review of patient past medical history, allergies, medications, health status, including review of consultants reports, laboratory and other test data, was performed as part of comprehensive evaluation and provision of chronic care management services.  ? ?SDOH:  (Social Determinants of Health) assessments and interventions  performed: Yes ? ?Financial Resource Strain: Low Risk   ? Difficulty of Paying Living Expenses: Not very hard  ? ? ?SDOH Screenings  ? ?Alcohol Screen: Low Risk   ? Last Alcohol Screening Score (AUDIT): 0  ?Depression (PHQ2-9): Medium Risk  ? PHQ-2 Score:  11  ?Financial Resource Strain: Low Risk   ? Difficulty of Paying Living Expenses: Not very hard  ?Food Insecurity: No Food Insecurity  ? Worried About Charity fundraiser in the Last Year: Never true  ? Ran Out of Food in the Last Year: Never true  ?Housing: Low Risk   ? Last Housing Risk Score: 0  ?Physical Activity: Inactive  ? Days of Exercise per Week: 0 days  ? Minutes of Exercise per Session: 0 min  ?Social Connections: Socially Isolated  ? Frequency of Communication with Friends and Family: More than three times a week  ? Frequency of Social Gatherings with Friends and Family: Once a week  ? Attends Religious Services: Never  ? Active Member of Clubs or Organizations: No  ? Attends Archivist Meetings: Never  ? Marital Status: Widowed  ?Stress: No Stress Concern Present  ? Feeling of Stress : Not at all  ?Tobacco Use: High Risk  ? Smoking Tobacco Use: Never  ? Smokeless Tobacco Use: Current  ? Passive Exposure: Not on file  ?Transportation Needs: No Transportation Needs  ? Lack of Transportation (Medical): No  ? Lack of Transportation (Non-Medical): No  ? ? ?CCM Care Plan ? ?Allergies  ?Allergen Reactions  ? Codeine Itching  ? Lisinopril Swelling  ? ? ?Medications Reviewed Today   ? ? Reviewed by [name], RPH (Pharmacist) on 02/07/22 at [place] List Status: <None>  ? ?Medication Order Taking? Sig Documenting Provider Last Dose Status Informant  ?albuterol (VENTOLIN HFA) 108 (90 Base) MCG/ACT inhaler [id number]  Inhale 2 puffs into the lungs every 6 (six) hours as needed. Shortness of breath Susy Frizzle, MD  Active   ?amLODipine (NORVASC) 5 MG tablet [id number]  TAKE 1 TABLET BY MOUTH DAILY Susy Frizzle, MD  Active   ?aspirin  EC 81 MG tablet [id number]  Take 81 mg by mouth at bedtime. [provider]  Active Self  ?atorvastatin (LIPITOR) 40 MG tablet [id number]  TAKE 1 TABLET (40 MG TOTAL) BY MOUTH DAILY. Jenna Luo

## 2022-01-29 NOTE — Progress Notes (Signed)
This patient returns to my office for at risk foot care.  This patient requires this care by a professional since this patient will be at risk due to having diabetes mellitus.  This patient is unable to cut nails herself since the patient cannot reach her nails.These nails are painful walking and wearing shoes.  This patient presents for at risk foot care today.  General Appearance  Alert, conversant and in no acute stress.  Vascular  Dorsalis pedis and posterior tibial  pulses are palpable  bilaterally.  Capillary return is within normal limits  bilaterally. Temperature is within normal limits  bilaterally.  Neurologic  Senn-Weinstein monofilament wire test within normal limits  bilaterally. Muscle power within normal limits bilaterally.  Nails Thick disfigured discolored nails with subungual debris  from hallux to fifth toes bilaterally. No evidence of bacterial infection or drainage bilaterally.  Orthopedic  No limitations of motion  feet .  No crepitus or effusions noted.  No bony pathology or digital deformities noted.  Skin  normotropic skin with no porokeratosis noted bilaterally.  No signs of infections or ulcers noted.     Onychomycosis  Pain in right toes  Pain in left toes  Consent was obtained for treatment procedures.   Mechanical debridement of nails 1-5  bilaterally performed with a nail nipper.  Filed with dremel without incident.    Return office visit   3 months                   Told patient to return for periodic foot care and evaluation due to potential at risk complications.   Chen Saadeh DPM   

## 2022-01-30 ENCOUNTER — Other Ambulatory Visit: Payer: Self-pay | Admitting: Family Medicine

## 2022-02-07 ENCOUNTER — Ambulatory Visit (INDEPENDENT_AMBULATORY_CARE_PROVIDER_SITE_OTHER): Payer: Medicare Other | Admitting: Pharmacist

## 2022-02-07 DIAGNOSIS — E118 Type 2 diabetes mellitus with unspecified complications: Secondary | ICD-10-CM

## 2022-02-07 DIAGNOSIS — E785 Hyperlipidemia, unspecified: Secondary | ICD-10-CM

## 2022-02-07 NOTE — Patient Instructions (Addendum)
Visit Information ? ? Goals Addressed   ? ?  ?  ?  ?  ? This Visit's Progress  ?  Monitor and Manage My Blood Sugar-Diabetes Type 2   On track  ?  Timeframe:  Long-Range Goal ?Priority:  High ?Start Date:  04/30/21                           ?Expected End Date: 11/11/20                     ? ?Follow Up Date 07/20/21  ?  ?- check blood sugar at prescribed times ?- check blood sugar if I feel it is too high or too low ?- take the blood sugar log to all doctor visits  ?  ?Why is this important?   ?Checking your blood sugar at home helps to keep it from getting very high or very low.  ?Writing the results in a diary or log helps the doctor know how to care for you.  ?Your blood sugar log should have the time, date and the results.  ?Also, write down the amount of insulin or other medicine that you take.  ?Other information, like what you ate, exercise done and how you were feeling, will also be helpful.   ?  ?Notes:  ?  ? ?  ? ?Patient Care Plan: General Pharmacy (Adult)  ?  ? ?Problem Identified: HTN, HLD, DM   ?Priority: High  ?Onset Date: 04/30/2021  ?  ? ?Long-Range Goal: Patient-Specific Goal   ?Start Date: 04/30/2021  ?Expected End Date: 10/31/2021  ?Recent Progress: On track  ?Priority: High  ?Note:   ?Current Barriers:  ?Unable to independently afford treatment regimen ?Unable to independently monitor therapeutic efficacy ?Unable to achieve control of glucose.  ? ?Pharmacist Clinical Goal(s):  ?Patient will verbalize ability to afford treatment regimen ?achieve control of glucose as evidenced by monitoring/A1c ?adhere to prescribed medication regimen as evidenced by fill dates through collaboration with PharmD and provider.  ? ?Interventions: ?1:1 collaboration with Susy Frizzle, MD regarding development and update of comprehensive plan of care as evidenced by provider attestation and co-signature ?Inter-disciplinary care team collaboration (see longitudinal plan of care) ?Comprehensive medication review  performed; medication list updated in electronic medical record ? ?Hypertension (BP goal <140/90) ?-Controlled ?-Current treatment: ?Amlodipine '5mg'$  daily ?HCTZ '25mg'$  daily ?-Medications previously tried: valsartan  ?-Current home readings: not checking ?-Current dietary habits: patient reports her calorie intake has not been what it used to be.  She likes to snack all day.   ?-Current exercise habits: minimal/none.  Walks outside only when family comes to visit. ?-Denies hypotensive/hypertensive symptoms ?-Educated on BP goals and benefits of medications for prevention of heart attack, stroke and kidney damage; ?Exercise goal of 150 minutes per week; ?Importance of home blood pressure monitoring; ?Symptoms of hypotension and importance of maintaining adequate hydration; ?-Counseled to monitor BP at home whenever able, document, and provide log at future appointments ?-Counseled on diet and exercise extensively ?Recommended to continue current medication ?Recommended implement some exercise plan, work her way up to 30 minutes 5 times per week.  Will provide chair exercises for her to do so that she does not have to walk outside alone. ? ?Hyperlipidemia: (LDL goal < 70) ?-Not ideally controlled ?-Current treatment: ?Atorvastatin '40mg'$  Appropriate, Query effective,  ?-Medications previously tried: none noted  ?-Current dietary patterns: see above ?-Current exercise habits: minimal ?-Educated on Cholesterol goals;  ?  Benefits of statin for ASCVD risk reduction; ?Importance of limiting foods high in cholesterol; ?-Recommended to continue current medication ?Recommended she switch to night time dosing to see if this will help bring down LDL any more.  Wrote it on her bottle to take at night. ? ?Update 08/02/21 ?Continues to take at night, has not had repeat lipid panel since she made that switch.  Would recommend repeat lipid panel to evaluate efficacy of current dose. ? ?Continue current meds for now ? ?Update  04/20/233 ?Due for repeat lipid panel.  Unsure if new dose of Lipitor is effective. ?Has upcoming OV with PCP June 2nd.  Recommend come fasting so that they can check labs at that time. ?Denies any adverse effects. ?Continue medication and can adjust based on next lab results. ? ? ?Diabetes (A1c goal <7%) ?-Not ideally controlled ?-Current medications: ?Metformin '1000mg'$  BID Appropriate, Effective, Safe, Accessible ?Insulin NPH 70-30 15 units qam Appropriate, Effective, Safe, Accessible ?-Medications previously tried: none noted  ?-Current home glucose readings ?glucose: did not have her logs, reports it has been in the 70s once recently but this was near bed time.  Does not seem as if she checks consistently ?-Reports hypoglycemic/hyperglycemic symptoms - sometimes has heart beating fast and wakes up at night.   ?-Current meal patterns:  ?Reports appetite has been down lately.  Not eating much besides salads, fruit, unless she is at a family get together. ?-Current exercise: minimal ?-Educated on A1c and blood sugar goals; ?Complications of diabetes including kidney damage, retinal damage, and cardiovascular disease; ?Prevention and management of hypoglycemic episodes; ?Benefits of routine self-monitoring of blood sugar; ?-Counseled to check feet daily and get yearly eye exams ?-Counseled on diet and exercise extensively ?Recommended to continue current medication ?Assessed patient finances. She reports high copay on Januvia.  Based on her reported income she will be a candidate for [employer] PAP program.   Application provided, patient to get income verification and return completed application. ? ?Update 08/02/21 ?She has not been checking glucose at home since her knee pain.  Have asked her to check at least a few times per week.  She needs updated A1c since has been approx. Three months off of Januvia.  Insulin dose has remained unchanged at 15 units qam.  She reports no episodes of hypoglycemia even though her  appetite has decreased. ?Would recommend repeat A1c - then if elevated proceed with [place] application as we still need proof of income requirement completed. ? ?Update 02/07/22 ?Continues to take only metformin and insulin.  She reports she is checking her glucose occasionally.  Last time she checked a morning glucose it was around 120. ?Denies any hypoglycemia.  She still does not have much of an appetite.  When she does eat it is mainly whatever she can get her hands on. ?Recommend recheck A1c.  Would hesitate to resume pioglitazone due to fluid retention and her reports of swelling currently. ? ?Patient Goals/Self-Care Activities ?Patient will:  ?- take medications as prescribed ?focus on medication adherence by pill box ?check glucose at least once daily (would prefer twice), document, and provide at future appointments ?target a minimum of 150 minutes of moderate intensity exercise weekly ? ?Follow Up Plan: The care management team will reach out to the patient again over the next 90 days.  ? ? ? ? ?  ? ?  ?  ? ?The patient verbalized understanding of instructions, educational materials, and care plan provided today and declined offer to receive copy of  patient instructions, educational materials, and care plan.  ?Telephone follow up appointment with pharmacy team member scheduled for: 6 months ? ?[name], [facility]  ?Beverly Milch, PharmD, CPP ?Clinical Pharmacist Practitioner ?[place] ?([phone] ?

## 2022-02-17 DIAGNOSIS — Z7984 Long term (current) use of oral hypoglycemic drugs: Secondary | ICD-10-CM

## 2022-02-17 DIAGNOSIS — E785 Hyperlipidemia, unspecified: Secondary | ICD-10-CM

## 2022-02-17 DIAGNOSIS — E1159 Type 2 diabetes mellitus with other circulatory complications: Secondary | ICD-10-CM

## 2022-02-17 DIAGNOSIS — Z794 Long term (current) use of insulin: Secondary | ICD-10-CM

## 2022-02-17 DIAGNOSIS — F17290 Nicotine dependence, other tobacco product, uncomplicated: Secondary | ICD-10-CM

## 2022-02-17 DIAGNOSIS — I10 Essential (primary) hypertension: Secondary | ICD-10-CM

## 2022-03-08 ENCOUNTER — Other Ambulatory Visit: Payer: Self-pay | Admitting: Family Medicine

## 2022-03-08 NOTE — Telephone Encounter (Signed)
Requested Prescriptions  Pending Prescriptions Disp Refills  . furosemide (LASIX) 40 MG tablet [Pharmacy Med Name: FUROSEMIDE 40 MG TABLET] 90 tablet     Sig: TAKE 1 TABLET BY MOUTH EVERY DAY     Cardiovascular:  Diuretics - Loop Failed - 03/08/2022 11:57 AM      Failed - K in normal range and within 180 days    Potassium  Date Value Ref Range Status  12/17/2021 3.4 (L) 3.5 - 5.3 mmol/L Final         Failed - Mg Level in normal range and within 180 days    Magnesium  Date Value Ref Range Status  01/17/2012 1.5 1.5 - 2.5 mg/dL Final         Passed - Ca in normal range and within 180 days    Calcium  Date Value Ref Range Status  12/17/2021 9.3 8.6 - 10.4 mg/dL Final         Passed - Na in normal range and within 180 days    Sodium  Date Value Ref Range Status  12/17/2021 141 135 - 146 mmol/L Final         Passed - Cr in normal range and within 180 days    Creat  Date Value Ref Range Status  12/17/2021 0.83 0.60 - 0.95 mg/dL Final   Creatinine, Urine  Date Value Ref Range Status  10/26/2020 82 20 - 275 mg/dL Final         Passed - Cl in normal range and within 180 days    Chloride  Date Value Ref Range Status  12/17/2021 102 98 - 110 mmol/L Final         Passed - Last BP in normal range    BP Readings from Last 1 Encounters:  12/20/21 112/68         Passed - Valid encounter within last 6 months    Recent Outpatient Visits          2 months ago Leg swelling   [place], Warren T, MD   2 months ago Leg swelling   [place] Susy Frizzle, MD   2 months ago Leg swelling   [place] Susy Frizzle, MD   6 months ago Acute pain of right knee   [place] Eulogio Bear, NP   8 months ago [place], Cammie Mcgee, MD      Future Appointments            In 2 weeks Pick[place], Cammie Mcgee, MD [place], [facility]   In 2 weeks  [place], Dewaine Conger, FNP [facility], [facility]

## 2022-03-11 ENCOUNTER — Telehealth: Payer: Self-pay | Admitting: Pharmacist

## 2022-03-11 NOTE — Progress Notes (Signed)
Chronic Care Management Pharmacy Assistant   Name: Amanda Riddle  MRN: [id number] DOB: 15-Oct-1935   Reason for Encounter: Disease State - General Adherence Call     Recent office visits:  None noted.   Recent consult visits:  None noted.  Hospital visits: 11/12/21 Medication Reconciliation was completed by comparing discharge summary, patient's EMR and Pharmacy list, and upon discussion with patient.   Admitted to the hospital on 11/12/21 due to Cardiac Cath. Discharge date was 11/12/21. Discharged from [facility]?Medications Started at [facility] Discharge:?? None noted.    Medication Changes at Hospital Discharge: None noted.    Medications Discontinued at Hospital Discharge: None noted.   Medications that remain the same after Hospital Discharge:??  All other medications will remain the same.   Medications: Outpatient Encounter Medications as of 03/11/2022  Medication Sig   albuterol (VENTOLIN HFA) 108 (90 Base) MCG/ACT inhaler Inhale 2 puffs into the lungs every 6 (six) hours as needed. Shortness of breath   amLODipine (NORVASC) 5 MG tablet TAKE 1 TABLET BY MOUTH DAILY   aspirin EC 81 MG tablet Take 81 mg by mouth at bedtime.   atorvastatin (LIPITOR) 40 MG tablet TAKE 1 TABLET (40 MG TOTAL) BY MOUTH DAILY.   Blood Glucose Monitoring Suppl (TRUE METRIX METER) w/Device KIT    clotrimazole-betamethasone (LOTRISONE) cream Apply 1 application topically 2 (two) times daily.   Cyanocobalamin (VITAMIN B 12 PO) Take 1 tablet by mouth at bedtime.    diclofenac Sodium (VOLTAREN) 1 % GEL Apply 4 g topically 4 (four) times daily.   Flaxseed, Linseed, (FLAXSEED OIL PO) Take 1 capsule by mouth 2 (two) times daily.    furosemide (LASIX) 40 MG tablet TAKE 1 TABLET BY MOUTH EVERY DAY   Glucosamine Sulfate-MSM (MSM-GLUCOSAMINE PO) Take 1 capsule by mouth 2 (two) times daily.   insulin isophane & regular human (NOVOLIN 70/30 FLEXPEN) (70-30) 100 UNIT/ML KwikPen INJECT  30  UNITS SUBCUTANEOUSLY IN THE MORNING AND 15 UNITS SUBCUTANEOUSLY IN THE AFTERNOON (Patient taking differently: INJECT 15 UNITS SUBCUTANEOUSLY IN THE MORNING)   Insulin Syringe-Needle U-100 (B-D INS SYR ULTRAFINE .3CC/31G) 31G X 5/16" 0.3 ML MISC Use as directed to inject insulin SQ 2x daiy. Dx: E11.65   JANUVIA 100 MG tablet Take 1 tablet by mouth daily.   meclizine (ANTIVERT) 12.5 MG tablet TAKE 1 TABLET BY MOUTH 3 TIMES DAILY AS NEEDED FOR DIZZINESS.   meloxicam (MOBIC) 7.5 MG tablet Take 15 mg by mouth daily.   metFORMIN (GLUCOPHAGE) 1000 MG tablet TAKE 1 TABLET TWICE DAILY   pioglitazone (ACTOS) 30 MG tablet TAKE 1 TABLET BY MOUTH DAILY   potassium chloride (KLOR-CON) 10 MEQ tablet Take 1 tablet (10 mEq total) by mouth daily.   triamcinolone cream (KENALOG) 0.1 % Apply 1 application topically 2 (two) times daily.   TRUE METRIX BLOOD GLUCOSE TEST test strip TEST BLOOD SUGAR THREE TIMES DAILY AS DIRECTED   TRUEplus Lancets 30G MISC TEST BLOOD SUGAR THREE TIMES DAILY AS DIRECTED   No facility-administered encounter medications on file as of 03/11/2022.    [place] for General Review Call   Chart Review:  Have there been any documented new, changed, or discontinued medications since last visit? No (If yes, include name, dose, frequency, date) Has there been any documented recent hospitalizations or ED visits since last visit with Clinical Pharmacist? No Brief Summary (including medication and/or Diagnosis changes):   Adherence Review:  Does the Clinical  Pharmacist Assistant have access to adherence rates? Yes Adherence rates for STAR metric medications (List medication(s)/day supply/ last 2 fill dates). Adherence rates for medications indicated for disease state being reviewed (List medication(s)/day supply/ last 2 fill dates). Does the patient have >5 day gap between last estimated fill dates for any of the above medications or other medication gaps? No Reason for  medication gaps.   Disease State Questions:  Able to connect with Patient?  Did patient have any problems with their health recently?  Note problems and Concerns: Have you had any admissions or emergency room visits or worsening of your condition(s) since last visit? Yes Details of ED visit, hospital visit and/or worsening condition(s): Have you had any visits with new specialists or providers since your last visit? Yes Explain: 11/12/21 for Cardiac Cath  Have you had any new health care problem(s) since your last visit?  New problem(s) reported: Have you run out of any of your medications since you last spoke with clinical pharmacist?  What caused you to run out of your medications? Are there any medications you are not taking as prescribed?  What kept you from taking your medications as prescribed? Are you having any issues or side effects with your medications?  Note of issues or side effects: Do you have any other health concerns or questions you want to discuss with your Clinical Pharmacist before your next visit?  Note additional concerns and questions from Patient. Are there any health concerns that you feel we can do a better job addressing?  Note Patient's response. Are you having any problems with any of the following since the last visit: (select all that apply)  None  Details: 12. Any falls since last visit?   Details: 13. Any increased or uncontrolled pain since last visit?   Details:  14. Next visit Type: office       Visit with: Jenna Luo PCP        Date: 03/22/22        Time: 8:15 am  15. Additional Details?    Care Gaps   AWV: done 03/09/21 Colonoscopy: done 03/30/13 DM Eye Exam: done 04/24/22 DM Foot Exam: done 05/17/22 Microalbumin: done 05/17/21 HbgAIC: 05/17/21 (6.4) DEXA: done 10/30/10 (ordered) Mammogram: done 07/19/20 (ordered)     Star Rating Drugs: Pioglitazone (ACTOS) 30 MG tablet - last filled 02/13/22 100 days  Metformin (GLUCOPHAGE) 1000 MG  tablet - last filled 02/13/22 100 days  Januvia 100 MG tablet - last filled 06/08/21 90 days  Atorvastatin (LIPITOR) 40 MG tablet - last filled 02/13/22 100 days    Future Appointments  Date Time Provider [place]  03/22/2022  8:15 AM Susy Frizzle, MD [facility]  03/26/2022 10:00 AM Renee Rival, FNP [facility]  05/01/2022 10:45 AM Gardiner Barefoot, DPM [facility]  08/15/2022  3:45 PM [facility] PHARMACIST [facility]   Multiple attempts were made to contact patient. Attempts were unsuccessful. / ls,CMA   Jobe Gibbon, [place] Pharmacist Assistant  [phone]

## 2022-03-19 ENCOUNTER — Ambulatory Visit: Payer: Medicare Other | Admitting: Family Medicine

## 2022-03-22 ENCOUNTER — Ambulatory Visit (INDEPENDENT_AMBULATORY_CARE_PROVIDER_SITE_OTHER): Payer: Medicare Other | Admitting: Family Medicine

## 2022-03-22 ENCOUNTER — Ambulatory Visit (INDEPENDENT_AMBULATORY_CARE_PROVIDER_SITE_OTHER): Payer: Medicare Other

## 2022-03-22 ENCOUNTER — Other Ambulatory Visit: Payer: Self-pay | Admitting: Family Medicine

## 2022-03-22 VITALS — BP 120/78 | HR 74 | Temp 98.5°F | Ht 65.0 in | Wt 212.0 lb

## 2022-03-22 VITALS — BP 120/78 | Ht 65.0 in | Wt 212.0 lb

## 2022-03-22 DIAGNOSIS — I10 Essential (primary) hypertension: Secondary | ICD-10-CM | POA: Diagnosis not present

## 2022-03-22 DIAGNOSIS — E118 Type 2 diabetes mellitus with unspecified complications: Secondary | ICD-10-CM | POA: Diagnosis not present

## 2022-03-22 DIAGNOSIS — Z0000 Encounter for general adult medical examination without abnormal findings: Secondary | ICD-10-CM

## 2022-03-22 DIAGNOSIS — Z5941 Food insecurity: Secondary | ICD-10-CM

## 2022-03-22 DIAGNOSIS — E782 Mixed hyperlipidemia: Secondary | ICD-10-CM

## 2022-03-22 MED ORDER — CLOTRIMAZOLE-BETAMETHASONE 1-0.05 % EX CREA: 1.0000 "application " | TOPICAL_CREAM | Freq: Two times a day (BID) | CUTANEOUS | 0 refills | Status: DC

## 2022-03-22 MED ORDER — BLOOD GLUCOSE MONITOR KIT
PACK | 0 refills | Status: DC
Start: 2022-03-22 — End: 2022-03-25

## 2022-03-22 NOTE — Patient Instructions (Signed)
Amanda Riddle , Thank you for taking time to come for your Medicare Wellness Visit. I appreciate your ongoing commitment to your health goals. Please review the following plan we discussed and let me know if I can assist you in the future.   Screening recommendations/referrals: Colonoscopy: No longer required. Mammogram: No longer required. Bone Density: No longer required.  Recommended yearly ophthalmology/optometry visit for glaucoma screening and checkup Recommended yearly dental visit for hygiene and checkup  Vaccinations: Influenza vaccine: Done 09/25/2021 Repeat annually  Pneumococcal vaccine: Done 11/02/2013 and 05/09/2015. Tdap vaccine: Due every 10 years. Shingles vaccine: Done 02/05/2022. Second dose due after 04/07/2022.    Covid-19:Done 10/15/2021, 05/05/2020, 04/14/2020.  Advanced directives: Advance directive discussed with you today. Even though you declined this today, please call our office should you change your mind, and we can give you the proper paperwork for you to fill out.   Conditions/risks identified: Aim for 30 minutes of exercise or brisk walking, 6-8 glasses of water, and 5 servings of fruits and vegetables each day.   Next appointment: Follow up in one year for your annual wellness visit 2024.   Preventive Care 19 Years and Older, Female Preventive care refers to lifestyle choices and visits with your health care provider that can promote health and wellness. What does preventive care include? A yearly physical exam. This is also called an annual well check. Dental exams once or twice a year. Routine eye exams. Ask your health care provider how often you should have your eyes checked. Personal lifestyle choices, including: Daily care of your teeth and gums. Regular physical activity. Eating a healthy diet. Avoiding tobacco and drug use. Limiting alcohol use. Practicing safe sex. Taking low-dose aspirin every day. Taking vitamin and mineral supplements  as recommended by your health care provider. What happens during an annual well check? The services and screenings done by your health care provider during your annual well check will depend on your age, overall health, lifestyle risk factors, and family history of disease. Counseling  Your health care provider may ask you questions about your: Alcohol use. Tobacco use. Drug use. Emotional well-being. Home and relationship well-being. Sexual activity. Eating habits. History of falls. Memory and ability to understand (cognition). Work and work Statistician. Reproductive health. Screening  You may have the following tests or measurements: Height, weight, and BMI. Blood pressure. Lipid and cholesterol levels. These may be checked every 5 years, or more frequently if you are over 54 years old. Skin check. Lung cancer screening. You may have this screening every year starting at age 52 if you have a 30-pack-year history of smoking and currently smoke or have quit within the past 15 years. Fecal occult blood test (FOBT) of the stool. You may have this test every year starting at age 108. Flexible sigmoidoscopy or colonoscopy. You may have a sigmoidoscopy every 5 years or a colonoscopy every 10 years starting at age 35. Hepatitis C blood test. Hepatitis B blood test. Sexually transmitted disease (STD) testing. Diabetes screening. This is done by checking your blood sugar (glucose) after you have not eaten for a while (fasting). You may have this done every 1-3 years. Bone density scan. This is done to screen for osteoporosis. You may have this done starting at age 80. Mammogram. This may be done every 1-2 years. Talk to your health care provider about how often you should have regular mammograms. Talk with your health care provider about your test results, treatment options, and if necessary, the need  for more tests. Vaccines  Your health care provider may recommend certain vaccines, such  as: Influenza vaccine. This is recommended every year. Tetanus, diphtheria, and acellular pertussis (Tdap, Td) vaccine. You may need a Td booster every 10 years. Zoster vaccine. You may need this after age 82. Pneumococcal 13-valent conjugate (PCV13) vaccine. One dose is recommended after age 43. Pneumococcal polysaccharide (PPSV23) vaccine. One dose is recommended after age 94. Talk to your health care provider about which screenings and vaccines you need and how often you need them. This information is not intended to replace advice given to you by your health care provider. Make sure you discuss any questions you have with your health care provider. Document Released: 11/03/2015 Document Revised: 06/26/2016 Document Reviewed: 08/08/2015 Elsevier Interactive Patient Education  2017 [place] Prevention in the Home Falls can cause injuries. They can happen to people of all ages. There are many things you can do to make your home safe and to help prevent falls. What can I do on the outside of my home? Regularly fix the edges of walkways and driveways and fix any cracks. Remove anything that might make you trip as you walk through a door, such as a raised step or threshold. Trim any bushes or trees on the path to your home. Use bright outdoor lighting. Clear any walking paths of anything that might make someone trip, such as rocks or tools. Regularly check to see if handrails are loose or broken. Make sure that both sides of any steps have handrails. Any raised decks and porches should have guardrails on the edges. Have any leaves, snow, or ice cleared regularly. Use sand or salt on walking paths during winter. Clean up any spills in your garage right away. This includes oil or grease spills. What can I do in the bathroom? Use night lights. Install grab bars by the toilet and in the tub and shower. Do not use towel bars as grab bars. Use non-skid mats or decals in the tub or  shower. If you need to sit down in the shower, use a plastic, non-slip stool. Keep the floor dry. Clean up any water that spills on the floor as soon as it happens. Remove soap buildup in the tub or shower regularly. Attach bath mats securely with double-sided non-slip rug tape. Do not have throw rugs and other things on the floor that can make you trip. What can I do in the bedroom? Use night lights. Make sure that you have a light by your bed that is easy to reach. Do not use any sheets or blankets that are too big for your bed. They should not hang down onto the floor. Have a firm chair that has side arms. You can use this for support while you get dressed. Do not have throw rugs and other things on the floor that can make you trip. What can I do in the kitchen? Clean up any spills right away. Avoid walking on wet floors. Keep items that you use a lot in easy-to-reach places. If you need to reach something above you, use a strong step stool that has a grab bar. Keep electrical cords out of the way. Do not use floor polish or wax that makes floors slippery. If you must use wax, use non-skid floor wax. Do not have throw rugs and other things on the floor that can make you trip. What can I do with my stairs? Do not leave any items on the stairs.  Make sure that there are handrails on both sides of the stairs and use them. Fix handrails that are broken or loose. Make sure that handrails are as long as the stairways. Check any carpeting to make sure that it is firmly attached to the stairs. Fix any carpet that is loose or worn. Avoid having throw rugs at the top or bottom of the stairs. If you do have throw rugs, attach them to the floor with carpet tape. Make sure that you have a light switch at the top of the stairs and the bottom of the stairs. If you do not have them, ask someone to add them for you. What else can I do to help prevent falls? Wear shoes that: Do not have high heels. Have  rubber bottoms. Are comfortable and fit you well. Are closed at the toe. Do not wear sandals. If you use a stepladder: Make sure that it is fully opened. Do not climb a closed stepladder. Make sure that both sides of the stepladder are locked into place. Ask someone to hold it for you, if possible. Clearly mark and make sure that you can see: Any grab bars or handrails. First and last steps. Where the edge of each step is. Use tools that help you move around (mobility aids) if they are needed. These include: Canes. Walkers. Scooters. Crutches. Turn on the lights when you go into a dark area. Replace any light bulbs as soon as they burn out. Set up your furniture so you have a clear path. Avoid moving your furniture around. If any of your floors are uneven, fix them. If there are any pets around you, be aware of where they are. Review your medicines with your doctor. Some medicines can make you feel dizzy. This can increase your chance of falling. Ask your doctor what other things that you can do to help prevent falls. This information is not intended to replace advice given to you by your health care provider. Make sure you discuss any questions you have with your health care provider. Document Released: 08/03/2009 Document Revised: 03/14/2016 Document Reviewed: 11/11/2014 Elsevier Interactive Patient Education  2017 [employer].

## 2022-03-22 NOTE — Addendum Note (Signed)
Addended by: Randal Buba K on: 03/22/2022 10:45 AM   Modules accepted: Orders

## 2022-03-22 NOTE — Progress Notes (Signed)
Subjective:    Patient ID: Amanda Riddle, female    DOB: January 04, 1935, 87 y.o.   MRN: [id number] Patient is here today for follow-up of her diabetes.  Diabetic foot exam was performed today and the patient denies any claudication or neuropathy in the feet.  She has palpable dorsalis pedis pulses and is able to feel a 10 g monofilament in all 5 toes and on the balls of her feet.  She denies any chest pain or shortness of breath or dyspnea on exertion.  Her blood pressure today is well controlled.  She states that she has had 2 hypoglycemic episodes in the last 3 months.  However she is not checking her sugars because she has no test strips for her machine.  She denies any polyuria polydipsia.  She is only taking 15 units of insulin daily and metformin.  She stopped all of her other diabetic medication due to cost.  Past Medical History:  Diagnosis Date   Diabetes mellitus    Diverticulosis    Hypercholesteremia    Hypertension    Past Surgical History:  Procedure Laterality Date   ABDOMINAL HYSTERECTOMY  1986   CATARACT EXTRACTION W/PHACO Right 11/12/2021   Procedure: CATARACT EXTRACTION PHACO AND INTRAOCULAR LENS PLACEMENT (IOC);  Surgeon: Baruch Goldmann, MD;  Location: [facility];  Service: Ophthalmology;  Laterality: Right;  CDE: 11.79   TONSILLECTOMY  1950   Current Outpatient Medications on File Prior to Visit  Medication Sig Dispense Refill   amLODipine (NORVASC) 5 MG tablet TAKE 1 TABLET BY MOUTH DAILY 100 tablet 2   atorvastatin (LIPITOR) 40 MG tablet TAKE 1 TABLET (40 MG TOTAL) BY MOUTH DAILY. 90 tablet 2   Flaxseed, Linseed, (FLAXSEED OIL PO) Take 1 capsule by mouth 2 (two) times daily.      furosemide (LASIX) 40 MG tablet TAKE 1 TABLET BY MOUTH EVERY DAY 90 tablet 0   insulin isophane & regular human (NOVOLIN 70/30 FLEXPEN) (70-30) 100 UNIT/ML KwikPen INJECT 30  UNITS SUBCUTANEOUSLY IN THE MORNING AND 15 UNITS SUBCUTANEOUSLY IN THE AFTERNOON (Patient taking differently: INJECT 15 UNITS  SUBCUTANEOUSLY IN THE MORNING) 15 mL 11   Insulin Syringe-Needle U-100 (B-D INS SYR ULTRAFINE .3CC/31G) 31G X 5/16" 0.3 ML MISC Use as directed to inject insulin SQ 2x daiy. Dx: E11.65 300 each 3   metFORMIN (GLUCOPHAGE) 1000 MG tablet TAKE 1 TABLET TWICE DAILY 180 tablet 1   potassium chloride (KLOR-CON) 10 MEQ tablet Take 1 tablet (10 mEq total) by mouth daily. 90 tablet 3   TRUE METRIX BLOOD GLUCOSE TEST test strip TEST BLOOD SUGAR THREE TIMES DAILY AS DIRECTED 300 strip 1   TRUEplus Lancets 30G MISC TEST BLOOD SUGAR THREE TIMES DAILY AS DIRECTED 300 each 1   albuterol (VENTOLIN HFA) 108 (90 Base) MCG/ACT inhaler Inhale 2 puffs into the lungs every 6 (six) hours as needed. Shortness of breath (Patient not taking: Reported on 03/22/2022) 3 each 2   aspirin EC 81 MG tablet Take 81 mg by mouth at bedtime. (Patient not taking: Reported on 03/22/2022)     Blood Glucose Monitoring Suppl (TRUE METRIX METER) w/Device KIT  (Patient not taking: Reported on 03/22/2022)     Cyanocobalamin (VITAMIN B 12 PO) Take 1 tablet by mouth at bedtime.  (Patient not taking: Reported on 03/22/2022)     diclofenac Sodium (VOLTAREN) 1 % GEL Apply 4 g topically 4 (four) times daily. (Patient not taking: Reported on 03/22/2022) 50 g 1   Glucosamine Sulfate-MSM (MSM-GLUCOSAMINE  PO) Take 1 capsule by mouth 2 (two) times daily. (Patient not taking: Reported on 03/22/2022)     JANUVIA 100 MG tablet Take 1 tablet by mouth daily. (Patient not taking: Reported on 03/22/2022)     meclizine (ANTIVERT) 12.5 MG tablet TAKE 1 TABLET BY MOUTH 3 TIMES DAILY AS NEEDED FOR DIZZINESS. (Patient not taking: Reported on 03/22/2022) 40 tablet 0   meloxicam (MOBIC) 7.5 MG tablet Take 15 mg by mouth daily. (Patient not taking: Reported on 03/22/2022)     pioglitazone (ACTOS) 30 MG tablet TAKE 1 TABLET BY MOUTH DAILY (Patient not taking: Reported on 03/22/2022) 100 tablet 2   triamcinolone cream (KENALOG) 0.1 % Apply 1 application topically 2 (two) times daily.  (Patient not taking: Reported on 03/22/2022) 30 g 0   No current facility-administered medications on file prior to visit.     Allergies  Allergen Reactions   Codeine Itching   Lisinopril Swelling   Social History   Socioeconomic History   Marital status: Divorced    Spouse name: Not on file   Number of children: 2   Years of education: Not on file   Highest education level: Not on file  Occupational History   Not on file  Tobacco Use   Smoking status: Never   Smokeless tobacco: Current    Types: Snuff  Vaping Use   Vaping Use: Never used  Substance and Sexual Activity   Alcohol use: No   Drug use: No   Sexual activity: Not Currently  Other Topics Concern   Not on file  Social History Narrative   Not on file   Social Determinants of Health   Financial Resource Strain: Not on file  Food Insecurity: Not on file  Transportation Needs: Not on file  Physical Activity: Not on file  Stress: Not on file  Social Connections: Not on file  Intimate Partner Violence: Not on file     Review of Systems  All other systems reviewed and are negative.     Objective:   Physical Exam Vitals reviewed.  Constitutional:      Appearance: She is obese.  Cardiovascular:     Rate and Rhythm: Normal rate and regular rhythm.     Heart sounds: Normal heart sounds. No murmur heard.   No friction rub. No gallop.  Pulmonary:     Effort: Pulmonary effort is normal. No respiratory distress.     Breath sounds: Normal breath sounds. No stridor. No wheezing, rhonchi or rales.  Chest:     Chest wall: No tenderness.  Musculoskeletal:     Right lower leg: No edema.     Left lower leg: No edema.  Neurological:     Mental Status: She is alert.          Assessment & Plan:  Controlled type 2 diabetes mellitus with complication, without long-term current use of insulin ([place]) - Plan: CBC with Differential/Platelet, COMPLETE METABOLIC PANEL WITH GFR, Lipid panel, Microalbumin, urine,  Hemoglobin A1c  Mixed hyperlipidemia  Hypertension, unspecified type Patient has stopped checking her sugars  Stop multiple medications.  Therefore I am concerned that her sugars are out of control.  I have sent over an order for test strips and glucometer for the pharmacy today and encouraged her to check blood sugar at least twice a day so that we can determine how her fasting blood sugars are running however 2-hour postprandial sugars are running.  Blood pressure is excellent.  Check a fasting lipid panel.  Goal LDL cholesterol is less than 100.  I did give her a prescription for Lotrisone to use for intertrigo-like rash under both arms in the axilla

## 2022-03-22 NOTE — Telephone Encounter (Signed)
Requested medication (s) are due for refill today:   Prescribed this morning  Requested medication (s) are on the active medication list:   Yes  Future visit scheduled:   No   Last ordered: Today 6/2  Returned because pharmacy requesting separate prescriptions for the meter, lancets and test strips   Requested Prescriptions  Pending Prescriptions Disp Refills   Blood Glucose Monitoring Suppl (GHT BLOOD GLUCOSE MONITOR) w/Device KIT [Pharmacy Med Name: BLOOD GLUCOSE MONITORING SYST] 1 kit 0    Sig: DISPENSE BASED ON PATIENT AND INSURANCE PREFERENCE. USE UP TO FOUR TIMES DAILY AS DIRECTED.     Endocrinology: Diabetes - Testing Supplies Passed - 03/22/2022  9:46 AM      Passed - Valid encounter within last 12 months    Recent Outpatient Visits           Today Controlled type 2 diabetes mellitus with complication, without long-term current use of insulin ([place])   [place] Pickard, Cammie Mcgee, MD   3 months ago Leg swelling   [place] Dennard Schaumann, Cammie Mcgee, MD   3 months ago Leg swelling   [place] Dennard Schaumann, Cammie Mcgee, MD   3 months ago Leg swelling   [place] Susy Frizzle, MD   7 months ago Acute pain of right knee   [place] Eulogio Bear, NP       Future Appointments             In 4 days [place], Dewaine Conger, FNP [facility]

## 2022-03-22 NOTE — Progress Notes (Addendum)
Subjective:   Amanda Riddle is a 87 y.o. female who presents for Medicare Annual (Subsequent) preventive examination. Review of Systems     Cardiac Risk Factors include: advanced age (>44mn, >>25women);diabetes mellitus;hypertension;dyslipidemia;sedentary lifestyle;obesity (BMI >30kg/m2)     Objective:    Today's Vitals   03/22/22 0848 03/22/22 0853  Weight: 212 lb (96.2 kg)   Height: '5\' 5"'  (1.651 m)   PainSc:  0-No pain   Body mass index is 35.28 kg/m.     03/22/2022    9:08 AM 11/07/2021    3:33 PM 03/09/2021   10:05 AM 03/28/2016    3:22 PM 01/17/2012    3:06 AM  Advanced Directives  Does Patient Have a Medical Advance Directive? No No No No Patient does not have advance directive  Would patient like information on creating a medical advance directive? No - Patient declined No - Patient declined No - Patient declined No - patient declined information   Pre-existing out of facility DNR order (yellow form or pink MOST form)     No    Current Medications (verified) Outpatient Encounter Medications as of 03/22/2022  Medication Sig   albuterol (VENTOLIN HFA) 108 (90 Base) MCG/ACT inhaler Inhale 2 puffs into the lungs every 6 (six) hours as needed. Shortness of breath   amLODipine (NORVASC) 5 MG tablet TAKE 1 TABLET BY MOUTH DAILY   aspirin EC 81 MG tablet Take 81 mg by mouth at bedtime.   atorvastatin (LIPITOR) 40 MG tablet TAKE 1 TABLET (40 MG TOTAL) BY MOUTH DAILY.   blood glucose meter kit and supplies KIT Dispense based on patient and insurance preference. Use up to four times daily as directed.   Blood Glucose Monitoring Suppl (TRUE METRIX METER) w/Device KIT    clotrimazole-betamethasone (LOTRISONE) cream Apply 1 application. topically 2 (two) times daily.   Cyanocobalamin (VITAMIN B 12 PO) Take 1 tablet by mouth at bedtime.   diclofenac Sodium (VOLTAREN) 1 % GEL Apply 4 g topically 4 (four) times daily.   Flaxseed, Linseed, (FLAXSEED OIL PO) Take 1 capsule by mouth 2 (two)  times daily.    furosemide (LASIX) 40 MG tablet TAKE 1 TABLET BY MOUTH EVERY DAY   Glucosamine Sulfate-MSM (MSM-GLUCOSAMINE PO) Take 1 capsule by mouth 2 (two) times daily.   insulin isophane & regular human (NOVOLIN 70/30 FLEXPEN) (70-30) 100 UNIT/ML KwikPen INJECT 30  UNITS SUBCUTANEOUSLY IN THE MORNING AND 15 UNITS SUBCUTANEOUSLY IN THE AFTERNOON (Patient taking differently: INJECT 15 UNITS SUBCUTANEOUSLY IN THE MORNING)   Insulin Syringe-Needle U-100 (B-D INS SYR ULTRAFINE .3CC/31G) 31G X 5/16" 0.3 ML MISC Use as directed to inject insulin SQ 2x daiy. Dx: E11.65   JANUVIA 100 MG tablet Take 1 tablet by mouth daily.   meclizine (ANTIVERT) 12.5 MG tablet TAKE 1 TABLET BY MOUTH 3 TIMES DAILY AS NEEDED FOR DIZZINESS.   meloxicam (MOBIC) 7.5 MG tablet Take 15 mg by mouth daily.   metFORMIN (GLUCOPHAGE) 1000 MG tablet TAKE 1 TABLET TWICE DAILY   pioglitazone (ACTOS) 30 MG tablet TAKE 1 TABLET BY MOUTH DAILY   potassium chloride (KLOR-CON) 10 MEQ tablet Take 1 tablet (10 mEq total) by mouth daily.   triamcinolone cream (KENALOG) 0.1 % Apply 1 application topically 2 (two) times daily.   TRUE METRIX BLOOD GLUCOSE TEST test strip TEST BLOOD SUGAR THREE TIMES DAILY AS DIRECTED   TRUEplus Lancets 30G MISC TEST BLOOD SUGAR THREE TIMES DAILY AS DIRECTED   [DISCONTINUED] clotrimazole-betamethasone (LOTRISONE) cream Apply 1 application  topically 2 (two) times daily. (Patient not taking: Reported on 03/22/2022)   No facility-administered encounter medications on file as of 03/22/2022.    Allergies (verified) Codeine and Lisinopril   History: Past Medical History:  Diagnosis Date   Diabetes mellitus    Diverticulosis    Hypercholesteremia    Hypertension    Past Surgical History:  Procedure Laterality Date   ABDOMINAL HYSTERECTOMY  1986   CATARACT EXTRACTION W/PHACO Right 11/12/2021   Procedure: CATARACT EXTRACTION PHACO AND INTRAOCULAR LENS PLACEMENT ([place]);  Surgeon: Baruch Goldmann, MD;  Location:  [facility];  Service: Ophthalmology;  Laterality: Right;  CDE: 11.79   TONSILLECTOMY  1950   Family History  Problem Relation Age of Onset   Colon cancer Neg Hx    Breast cancer Neg Hx    Social History   Socioeconomic History   Marital status: Divorced    Spouse name: Not on file   Number of children: 2   Years of education: Not on file   Highest education level: Not on file  Occupational History   Not on file  Tobacco Use   Smoking status: Never   Smokeless tobacco: Current    Types: Snuff  Vaping Use   Vaping Use: Never used  Substance and Sexual Activity   Alcohol use: No   Drug use: No   Sexual activity: Not Currently  Other Topics Concern   Not on file  Social History Narrative   Not on file   Social Determinants of Health   Financial Resource Strain: High Risk   Difficulty of Paying Living Expenses: Hard  Food Insecurity: Food Insecurity Present   Worried About Running Out of Food in the Last Year: Sometimes true   Ran Out of Food in the Last Year: Sometimes true  Transportation Needs: No Transportation Needs   Lack of Transportation (Medical): No   Lack of Transportation (Non-Medical): No  Physical Activity: Inactive   Days of Exercise per Week: 0 days   Minutes of Exercise per Session: 0 min  Stress: No Stress Concern Present   Feeling of Stress : Only a little  Social Connections: Moderately Isolated   Frequency of Communication with Friends and Family: More than three times a week   Frequency of Social Gatherings with Friends and Family: Once a week   Attends Religious Services: 1 to 4 times per year   Active Member of [employer] or Organizations: No   Attends Music therapist: Never   Marital Status: Divorced    Tobacco Counseling Ready to quit: Not Answered Counseling given: Not Answered   Clinical Intake:  Pre-visit preparation completed: Yes  Pain : No/denies pain Pain Score: 0-No pain Pain Type: Chronic pain Pain Location:  Back Pain Descriptors / Indicators: Aching, Dull Pain Onset: More than a month ago Pain Frequency: Intermittent     BMI - recorded: 35.28 Nutritional Status: BMI > 30  Obese Nutritional Risks: None Diabetes: Yes  How often do you need to have someone help you when you read instructions, pamphlets, or other written materials from your doctor or pharmacy?: 1 - Never  Diabetic?Nutrition Risk Assessment:  Has the patient had any N/V/D within the last 2 months?  No  Does the patient have any non-healing wounds?  No  Has the patient had any unintentional weight loss or weight gain?  No   Diabetes:  Is the patient diabetic?  Yes  If diabetic, was a CBG obtained today?  No  Did the patient  bring in their glucometer from home?  No  How often do you monitor your CBG's? Irregularly.   Financial Strains and Diabetes Management:  Are you having any financial strains with the device, your supplies or your medication? No .  Does the patient want to be seen by Chronic Care Management for management of their diabetes?  No  Would the patient like to be referred to a Nutritionist or for Diabetic Management?  No   Diabetic Exams:  Diabetic Eye Exam: Completed 04/24/2021.  Pt has been advised about the importance in completing this exam.  Diabetic Foot Exam: Completed 03/22/2022. Pt has been advised about the importance in completing this exam.   Interpreter Needed?: No  Information entered by :: mj Mesa Janus, lpn   Activities of Daily Living    03/22/2022    9:13 AM 11/07/2021    3:39 PM  In your present state of health, do you have any difficulty performing the following activities:  Hearing? 0   Vision? 0   Difficulty concentrating or making decisions? 0   Walking or climbing stairs? 0   Dressing or bathing? 0   Doing errands, shopping? 0 0  Preparing Food and eating ? N   Using the Toilet? N   In the past six months, have you accidently leaked urine? N   Do you have problems with loss  of bowel control? N   Managing your Medications? N   Managing your Finances? N   Housekeeping or managing your Housekeeping? N     Patient Care Team: Susy Frizzle, MD as PCP - General (Family Medicine) [name], [facility] as Pharmacist (Pharmacist)  Indicate any recent Medical Services you may have received from other than [facility] providers in the past year (date may be approximate).     Assessment:   This is a routine wellness examination for [place].  Hearing/Vision screen Hearing Screening - Comments:: No hearing issues.  Vision Screening - Comments:: No glasses since cataract surgery. Dr. Jorja Loa. 06/2021.  Dietary issues and exercise activities discussed: Current Exercise Habits: The patient does not participate in regular exercise at present, Exercise limited by: cardiac condition(s);orthopedic condition(s)   Goals Addressed             This Visit's Progress    Exercise 3x per week (30 min per time)       Try chair exercises due to current knee injury.        Depression Screen    03/22/2022    8:57 AM 03/09/2021   10:07 AM 10/25/2020    9:29 AM 11/30/2019    8:53 AM 11/23/2018    8:36 AM 08/14/2018    7:57 AM 08/07/2017   12:28 PM  PHQ 2/9 Scores  PHQ - 2 Score 0 3 0 0 0 0 3  PHQ- 9 Score  11     8    Fall Risk    03/22/2022    9:09 AM 08/13/2021    2:04 PM 03/09/2021   10:06 AM 10/25/2020    9:29 AM 11/30/2019    8:54 AM  Fall Risk   Falls in the past year? '1 1 1 ' 0 0  Number falls in past yr: 0 0 0 0   Injury with Fall? 1 1 0 0   Comment  right knee     Risk for fall due to : Impaired balance/gait;History of fall(s) Impaired mobility History of fall(s)    Follow up Falls prevention discussed Falls evaluation  completed Falls evaluation completed;Falls prevention discussed  Falls evaluation completed    FALL RISK PREVENTION PERTAINING TO THE HOME:  Any stairs in or around the home? Yes  If so, are there any without handrails? No  Home free of loose throw  rugs in walkways, pet beds, electrical cords, etc? Yes  Adequate lighting in your home to reduce risk of falls? Yes   ASSISTIVE DEVICES UTILIZED TO PREVENT FALLS:  Life alert? No  Use of a cane, walker or w/c? No  Grab bars in the bathroom? No  Shower chair or bench in shower? No  Elevated toilet seat or a handicapped toilet? No   TIMED UP AND GO:  Was the test performed? Yes .  Length of time to ambulate 10 feet: 12 sec.   Gait steady and fast without use of assistive device  Cognitive Function:        03/22/2022    9:14 AM 03/09/2021   10:22 AM  6CIT Screen  What Year? 0 points 0 points  What month? 0 points 0 points  What time? 0 points 0 points  Count back from 20 0 points 0 points  Months in reverse 2 points 0 points  Repeat phrase 0 points 2 points  Total Score 2 points 2 points    Immunizations Immunization History  Administered Date(s) Administered   Fluad Quad(high Dose 65+) 07/23/2019, 08/17/2020, 09/25/2021   Influenza, High Dose Seasonal PF 08/15/2017, 08/14/2018   Influenza,inj,Quad PF,6+ Mos 07/05/2013, 07/05/2014, 08/10/2015, 08/26/2016   PFIZER(Purple Top)SARS-COV-2 Vaccination 04/14/2020, 05/05/2020   Pfizer Covid-19 Vaccine Bivalent Booster 87yr & up 10/15/2021   Pneumococcal Conjugate-13 11/02/2013   Pneumococcal Polysaccharide-23 05/09/2015   Zoster Recombinat (Shingrix) 02/05/2022    TDAP status: Due, Education has been provided regarding the importance of this vaccine. Advised may receive this vaccine at local pharmacy or Health Dept. Aware to provide a copy of the vaccination record if obtained from local pharmacy or Health Dept. Verbalized acceptance and understanding.  Flu Vaccine status: Up to date  Pneumococcal vaccine status: Up to date  Covid-19 vaccine status: Completed vaccines  Qualifies for Shingles Vaccine? Yes   Zostavax completed Yes   Shingrix Completed?: No.    Education has been provided regarding the importance of this  vaccine. Patient has been advised to call insurance company to determine out of pocket expense if they have not yet received this vaccine. Advised may also receive vaccine at local pharmacy or Health Dept. Verbalized acceptance and understanding.  Screening Tests Health Maintenance  Topic Date Due   HEMOGLOBIN A1C  11/17/2021   URINE MICROALBUMIN  05/17/2022   TETANUS/TDAP  10/18/2022 (Originally 04/19/1954)   Zoster Vaccines- Shingrix (2 of 2) 04/02/2022   OPHTHALMOLOGY EXAM  04/24/2022   INFLUENZA VACCINE  05/21/2022   FOOT EXAM  03/23/2023   Pneumonia Vaccine 85 Years old  Completed   DEXA SCAN  Completed   COVID-19 Vaccine  Completed   HPV VACCINES  Aged Out   COLONOSCOPY (Pts 45-447yrInsurance coverage will need to be confirmed)  Discontinued    Health Maintenance  Health Maintenance Due  Topic Date Due   HEMOGLOBIN A1C  11/17/2021   URINE MICROALBUMIN  05/17/2022    Colorectal cancer screening: No longer required.   Mammogram status: No longer required due to age.  Bone Density status: Completed 10/31/2019. Results reflect: Bone density results: OSTEOPENIA. Repeat every 2 years.  Lung Cancer Screening: (Low Dose CT Chest recommended if Age 85-80ears, 30 pack-year currently  smoking OR have quit w/in 15years.) does not qualify.   Additional Screening:  Hepatitis C Screening: does not qualify.  Vision Screening: Recommended annual ophthalmology exams for early detection of glaucoma and other disorders of the eye. Is the patient up to date with their annual eye exam?  Yes  Who is the provider or what is the name of the office in which the patient attends annual eye exams? Dr. Jorja Loa  If pt is not established with a provider, would they like to be referred to a provider to establish care? No .   Dental Screening: Recommended annual dental exams for proper oral hygiene  Community Resource Referral / Chronic Care Management: CRR required this visit?  Yes   CCM  required this visit?  No      Plan:     I have personally reviewed and noted the following in the patient's chart:   Medical and social history Use of alcohol, tobacco or illicit drugs  Current medications and supplements including opioid prescriptions.  Functional ability and status Nutritional status Physical activity Advanced directives List of other physicians Hospitalizations, surgeries, and ER visits in previous 12 months Vitals Screenings to include cognitive, depression, and falls Referrals and appointments  In addition, I have reviewed and discussed with patient certain preventive protocols, quality metrics, and best practice recommendations. A written personalized care plan for preventive services as well as general preventive health recommendations were provided to patient.     Chriss Driver, LPN   11/24/4626   Nurse Notes: Pt c/o issues of being able to afford her food. Discussed CRR referral. Pt is agreeable. Order placed.

## 2022-03-23 LAB — CBC WITH DIFFERENTIAL/PLATELET
Absolute Monocytes: 362 cells/uL (ref 200–950)
Basophils Absolute: 32 cells/uL (ref 0–200)
Basophils Relative: 0.6 %
Eosinophils Absolute: 70 cells/uL (ref 15–500)
Eosinophils Relative: 1.3 %
HCT: 37.9 % (ref 35.0–45.0)
Hemoglobin: 12.4 g/dL (ref 11.7–15.5)
Lymphs Abs: 2408 cells/uL (ref 850–3900)
MCH: 30.2 pg (ref 27.0–33.0)
MCHC: 32.7 g/dL (ref 32.0–36.0)
MCV: 92.2 fL (ref 80.0–100.0)
MPV: 9.8 fL (ref 7.5–12.5)
Monocytes Relative: 6.7 %
Neutro Abs: 2527 cells/uL (ref 1500–7800)
Neutrophils Relative %: 46.8 %
Platelets: 264 10*3/uL (ref 140–400)
RBC: 4.11 10*6/uL (ref 3.80–5.10)
RDW: 13 % (ref 11.0–15.0)
Total Lymphocyte: 44.6 %
WBC: 5.4 10*3/uL (ref 3.8–10.8)

## 2022-03-23 LAB — LIPID PANEL
Cholesterol: 197 mg/dL (ref <200)
HDL: 74 mg/dL (ref >=50)
LDL Cholesterol (Calc): 99 mg/dL (calc)
Non-HDL Cholesterol (Calc): 123 mg/dL (calc) (ref <130)
Total CHOL/HDL Ratio: 2.7 (calc) (ref <5.0)
Triglycerides: 143 mg/dL (ref <150)

## 2022-03-23 LAB — HEMOGLOBIN A1C
Hgb A1c MFr Bld: 7.3 % of total Hgb — ABNORMAL HIGH (ref <5.7)
Mean Plasma Glucose: 163 mg/dL
eAG (mmol/L): 9 mmol/L

## 2022-03-23 LAB — COMPLETE METABOLIC PANEL WITH GFR
AG Ratio: 1.5 (calc) (ref 1.0–2.5)
ALT: 12 U/L (ref 6–29)
AST: 16 U/L (ref 10–35)
Albumin: 4.1 g/dL (ref 3.6–5.1)
Alkaline phosphatase (APISO): 97 U/L (ref 37–153)
BUN/Creatinine Ratio: 12 (calc) (ref 6–22)
BUN: 15 mg/dL (ref 7–25)
CO2: 25 mmol/L (ref 20–32)
Calcium: 9.8 mg/dL (ref 8.6–10.4)
Chloride: 103 mmol/L (ref 98–110)
Creat: 1.24 mg/dL — ABNORMAL HIGH (ref 0.60–0.95)
Globulin: 2.8 g/dL (calc) (ref 1.9–3.7)
Glucose, Bld: 127 mg/dL — ABNORMAL HIGH (ref 65–99)
Potassium: 3.9 mmol/L (ref 3.5–5.3)
Sodium: 142 mmol/L (ref 135–146)
Total Bilirubin: 1.1 mg/dL (ref 0.2–1.2)
Total Protein: 6.9 g/dL (ref 6.1–8.1)
eGFR: 43 mL/min/{1.73_m2} — ABNORMAL LOW (ref >=60)

## 2022-03-23 LAB — MICROALBUMIN, URINE: Microalb, Ur: 0.4 mg/dL

## 2022-03-25 ENCOUNTER — Other Ambulatory Visit: Payer: Self-pay | Admitting: Family Medicine

## 2022-03-25 DIAGNOSIS — E118 Type 2 diabetes mellitus with unspecified complications: Secondary | ICD-10-CM

## 2022-03-25 DIAGNOSIS — E119 Type 2 diabetes mellitus without complications: Secondary | ICD-10-CM

## 2022-03-26 ENCOUNTER — Telehealth: Payer: Self-pay

## 2022-03-26 ENCOUNTER — Ambulatory Visit: Payer: Medicare Other | Admitting: Nurse Practitioner

## 2022-03-26 NOTE — Telephone Encounter (Signed)
   Telephone encounter was:  Successful.  03/26/2022 Name: CHANDRIKA SANDLES MRN: [id number] DOB: 03/14/1935  MARILOUISE DENSMORE is a 87 y.o. year old female who is a primary care patient of Pickard, Cammie Mcgee, MD . The community resource team was consulted for assistance with [place] guide performed the following interventions: Spoke with patient, she is interested in local food pantries. I asked if she was interested in receiving Meals on Wheels. Patient gave permission to send referral to [place] on Wheels.  Verified patient's mailing address. Letter saved in Epic.  Follow Up Plan:  Care guide will follow up with patient by phone over the next 7 days  Jaretssi Kraker, AAS Paralegal, [facility]  [address], [place] [address] ??[email or web address]  ?? [phone]   [email or web address]

## 2022-03-27 ENCOUNTER — Other Ambulatory Visit: Payer: Self-pay | Admitting: Family Medicine

## 2022-03-27 ENCOUNTER — Other Ambulatory Visit: Payer: Self-pay

## 2022-03-27 DIAGNOSIS — E119 Type 2 diabetes mellitus without complications: Secondary | ICD-10-CM

## 2022-03-27 DIAGNOSIS — E118 Type 2 diabetes mellitus with unspecified complications: Secondary | ICD-10-CM

## 2022-03-27 MED ORDER — ACCU-CHEK GUIDE W/DEVICE KIT: PACK | 0 refills | Status: DC

## 2022-03-27 MED ORDER — TRUEPLUS LANCETS 30G MISC: 1 refills | Status: DC

## 2022-03-27 MED ORDER — TRUE METRIX BLOOD GLUCOSE TEST VI STRP: ORAL_STRIP | 1 refills | Status: DC

## 2022-03-27 NOTE — Telephone Encounter (Signed)
Requested medication (s) are due for refill today: no  Requested medication (s) are on the active medication list: yes  Last refill:  03/27/22  Future visit scheduled: yes  Notes to clinic:  Unable to refill per protocol, pharmacy request alternative medication. Insurance will cover [place].     Requested Prescriptions  Pending Prescriptions Disp Refills   [place] [Pharmacy Med Name: TRUEPLUS ULTRA THIN 30G LANCET] 300 each 1    Sig: TEST BLOOD SUGAR THREE TIMES DAILY AS DIRECTED     There is no refill protocol information for this order     TRUE METRIX BLOOD GLUCOSE TEST test strip [Pharmacy Med Name: TRUE METRIX GLUCOSE TEST STRIP] 300 strip 1    Sig: Use to check blood sugars three times daily. DX E11.9     There is no refill protocol information for this order

## 2022-04-04 ENCOUNTER — Telehealth: Payer: Self-pay

## 2022-04-04 NOTE — Telephone Encounter (Signed)
   Telephone encounter was:  Successful.  04/04/2022 Name: Amanda Riddle MRN: [id number] DOB: 1934-10-30  Amanda Riddle is a 87 y.o. year old female who is a primary care patient of Pickard, Cammie Mcgee, MD . The community resource team was consulted for assistance with [place] guide performed the following interventions: Spoke with patient, she has received the mailed resource letter of local food pantries.  I informed her that a Education officer, museum from [employer] on Wheels will call her in the next few days to schedule an in-home assessment. Received message from Amanda Riddle at [employer], patient has been added to the inquiry list and a member from Social Work will reach out to schedule an in-home assessment.   Follow Up Plan:  No further follow up planned at this time. The patient has been provided with needed resources.  Amanda Riddle, AAS Paralegal, [place] [facility]  [address], [place] [address] ??[email or web address]  ?? [phone]   [email or web address]

## 2022-04-06 ENCOUNTER — Other Ambulatory Visit: Payer: Self-pay | Admitting: Family Medicine

## 2022-04-08 NOTE — Telephone Encounter (Signed)
Requested medications are due for refill today.  yes  Requested medications are on the active medications list.  yes  Last refill. 11/21/2021 #180 1 refills  Future visit scheduled.   no  Notes to clinic.  Pt is requesting 1 year supply.    Requested Prescriptions  Pending Prescriptions Disp Refills   metFORMIN (GLUCOPHAGE) 1000 MG tablet [Pharmacy Med Name: metFORMIN HCl 1000 MG Oral Tablet] 160 tablet 3    Sig: TAKE 1 TABLET BY MOUTH TWICE  DAILY     Endocrinology:  Diabetes - Biguanides Failed - 04/08/2022 11:29 AM      Failed - Cr in normal range and within 360 days    Creat  Date Value Ref Range Status  03/22/2022 1.24 (H) 0.60 - 0.95 mg/dL Final   Creatinine, Urine  Date Value Ref Range Status  10/26/2020 82 20 - 275 mg/dL Final         Failed - eGFR in normal range and within 360 days    GFR, Est African American  Date Value Ref Range Status  02/08/2021 58 (L) > OR = 60 mL/min/1.23m Final   GFR, Est Non African American  Date Value Ref Range Status  02/08/2021 50 (L) > OR = 60 mL/min/1.763mFinal   GFR, Estimated  Date Value Ref Range Status  06/27/2021 52 (L) >60 mL/min Final    Comment:    (NOTE) Calculated using the CKD-EPI Creatinine Equation (2021)    eGFR  Date Value Ref Range Status  03/22/2022 43 (L) > OR = 60 mL/min/1.7371minal    Comment:    The eGFR is based on the CKD-EPI 2021 equation. To calculate  the new eGFR from a previous Creatinine or Cystatin C result, go to https://www.kidney.org/professionals/ kdoqi/gfr%5Fcalculator          Failed - B12 Level in normal range and within 720 days    No results found for: "VITAMINB12"       Passed - HBA1C is between 0 and 7.9 and within 180 days    Hgb A1c MFr Bld  Date Value Ref Range Status  03/22/2022 7.3 (H) <5.7 % of total Hgb Final    Comment:    For someone without known diabetes, a hemoglobin A1c value of 6.5% or greater indicates that they may have  diabetes and this should be  confirmed with a follow-up  test. . For someone with known diabetes, a value <7% indicates  that their diabetes is well controlled and a value  greater than or equal to 7% indicates suboptimal  control. A1c targets should be individualized based on  duration of diabetes, age, comorbid conditions, and  other considerations. . Currently, no consensus exists regarding use of hemoglobin A1c for diagnosis of diabetes for children. .  Renella CunasValid encounter within last 6 months    Recent Outpatient Visits           2 weeks ago Controlled type 2 diabetes mellitus with complication, without long-term current use of insulin ([place] Mcgee[place]   3 months ago Leg swelling   [place]enn[place] SchaumannarCammie McgeeD   3 months ago Leg swelling   [place]enn[place] SchaumannarCammie McgeeD   3 months ago Leg swelling   [place]Susy FrizzleD   7 months ago Acute pain of right knee   [facility] Eulogio Bear, NP              Passed - CBC within normal limits and completed in the last 12 months    WBC  Date Value Ref Range Status  03/22/2022 5.4 3.8 - 10.8 Thousand/uL Final   RBC  Date Value Ref Range Status  03/22/2022 4.11 3.80 - 5.10 Million/uL Final   Hemoglobin  Date Value Ref Range Status  03/22/2022 12.4 11.7 - 15.5 g/dL Final   HCT  Date Value Ref Range Status  03/22/2022 37.9 35.0 - 45.0 % Final   MCHC  Date Value Ref Range Status  03/22/2022 32.7 32.0 - 36.0 g/dL Final   [facility]  Date Value Ref Range Status  03/22/2022 30.2 27.0 - 33.0 pg Final   MCV  Date Value Ref Range Status  03/22/2022 92.2 80.0 - 100.0 fL Final   No results found for: "PLTCOUNTKUC", "LABPLAT", "POCPLA" RDW  Date Value Ref Range Status  03/22/2022 13.0 11.0 - 15.0 % Final

## 2022-04-09 ENCOUNTER — Telehealth: Payer: Self-pay | Admitting: Pharmacist

## 2022-04-09 NOTE — Progress Notes (Signed)
Chronic Care Management Pharmacy Assistant   Name: Amanda Riddle  MRN: [id number] DOB: [birth year]   Reason for Encounter: Disease State - Diabetes Call     Recent office visits:  03/22/22 Annual Medicare Wellness Completed  Recent consult visits:  None noted.   Hospital visits: 11/12/21 Medication Reconciliation was completed by comparing discharge summary, patient's EMR and Pharmacy list, and upon discussion with patient.   Admitted to the hospital on 11/12/21 due to Cardiac Cath. Discharge date was 11/12/21. Discharged from [place]?Medications Started at [facility] Discharge:?? None noted.    Medication Changes at Hospital Discharge: None noted.    Medications Discontinued at Hospital Discharge: None noted.   Medications that remain the same after Hospital Discharge:??  All other medications will remain the same.  Medications: Outpatient Encounter Medications as of 04/09/2022  Medication Sig   albuterol (VENTOLIN HFA) 108 (90 Base) MCG/ACT inhaler Inhale 2 puffs into the lungs every 6 (six) hours as needed. Shortness of breath   amLODipine (NORVASC) 5 MG tablet TAKE 1 TABLET BY MOUTH DAILY   aspirin EC 81 MG tablet Take 81 mg by mouth at bedtime.   atorvastatin (LIPITOR) 40 MG tablet TAKE 1 TABLET (40 MG TOTAL) BY MOUTH DAILY.   Blood Glucose Monitoring Suppl (ACCU-CHEK GUIDE) w/Device KIT USE TO CHECK BLOOD SUGAR UP TO 4 TIMES DAILY. Dx E11.9   Blood Glucose Monitoring Suppl (TRUE METRIX METER) w/Device KIT    clotrimazole-betamethasone (LOTRISONE) cream Apply 1 application. topically 2 (two) times daily.   Cyanocobalamin (VITAMIN B 12 PO) Take 1 tablet by mouth at bedtime.   diclofenac Sodium (VOLTAREN) 1 % GEL Apply 4 g topically 4 (four) times daily.   Flaxseed, Linseed, (FLAXSEED OIL PO) Take 1 capsule by mouth 2 (two) times daily.    furosemide (LASIX) 40 MG tablet TAKE 1 TABLET BY MOUTH EVERY DAY   Glucosamine Sulfate-MSM (MSM-GLUCOSAMINE PO)  Take 1 capsule by mouth 2 (two) times daily.   insulin isophane & regular human (NOVOLIN 70/30 FLEXPEN) (70-30) 100 UNIT/ML KwikPen INJECT 30  UNITS SUBCUTANEOUSLY IN THE MORNING AND 15 UNITS SUBCUTANEOUSLY IN THE AFTERNOON (Patient taking differently: INJECT 15 UNITS SUBCUTANEOUSLY IN THE MORNING)   Insulin Syringe-Needle U-100 (B-D INS SYR ULTRAFINE .3CC/31G) 31G X 5/16" 0.3 ML MISC Use as directed to inject insulin SQ 2x daiy. Dx: E11.65   JANUVIA 100 MG tablet Take 1 tablet by mouth daily.   meclizine (ANTIVERT) 12.5 MG tablet TAKE 1 TABLET BY MOUTH 3 TIMES DAILY AS NEEDED FOR DIZZINESS.   meloxicam (MOBIC) 7.5 MG tablet Take 15 mg by mouth daily.   metFORMIN (GLUCOPHAGE) 1000 MG tablet TAKE 1 TABLET TWICE DAILY   pioglitazone (ACTOS) 30 MG tablet TAKE 1 TABLET BY MOUTH DAILY   potassium chloride (KLOR-CON) 10 MEQ tablet Take 1 tablet (10 mEq total) by mouth daily.   triamcinolone cream (KENALOG) 0.1 % Apply 1 application topically 2 (two) times daily.   TRUE METRIX BLOOD GLUCOSE TEST test strip USE TO CHECK BLOOD SUGARS THREE TIMES DAILY. DX E11.9   TRUEplus Lancets 30G MISC TEST BLOOD SUGAR THREE TIMES DAILY AS DIRECTED   No facility-administered encounter medications on file as of 04/09/2022.    Current antihyperglycemic regimen:  Metformin 1057m BID  Insulin NPH 70-30 15 units qam   What recent interventions/DTPs have been made to improve glycemic control:  Patient denied any recent changes to medication regimen.  Have there been any recent hospitalizations  or ED visits since last visit with [facility]PP?  Patient had hospital visit on 11/12/21 for Cardiac Cath  Patient denies hypoglycemic symptoms, including Pale, Sweaty, Shaky, Hungry, Nervous/irritable, and Vision changes   Patient denies hyperglycemic symptoms, including blurry vision, excessive thirst, fatigue, polyuria, and weakness   How often are you checking your blood sugar? Patient reported checking blood sugars regularly  again twice a day in am and pm.   What are your blood sugars ranging?  Fasting: 118 (this am) Before meals:  After meals:  Bedtime: 176  During the week, how often does your blood glucose drop below 70?  Patient reported she had one reading at 33 about 3 am one morning but it came up once she ate something and drank some juice and she was fine  Are you checking your feet daily/regularly? Patient reported she checks her feet regularly.     Adherence Review: Is the patient currently on a STATIN medication? Yes Is the patient currently on ACE/ARB medication? Yes Does the patient have >5 day gap between last estimated fill dates? yes   Care Gaps   AWV: done 03/22/22 Colonoscopy: done 03/30/13 DM Eye Exam: done 04/24/22 DM Foot Exam: done 05/17/22 Microalbumin: done 03/22/22 HbgAIC: 03/22/22 (7.3) DEXA: done 10/30/10 (ordered) Mammogram: done 07/19/20 (ordered)     Star Rating Drugs: Pioglitazone (ACTOS) 30 MG tablet - last filled 02/13/22 100 days  Metformin (GLUCOPHAGE) 1000 MG tablet - last filled 02/13/22 100 days  Januvia 100 MG tablet - last filled 06/08/21 90 days (?samples/PAP) Atorvastatin (LIPITOR) 40 MG tablet - last filled 02/13/22 100 days     Future Appointments  Date Time Provider [place]  05/01/2022 10:45 AM Gardiner Barefoot, DPM [facility]  07/04/2022  8:30 AM [facility]  08/15/2022  3:45 PM [facility] PHARMACIST [facility]  03/28/2023  9:00 AM [facility]-NURSE HEALTH ADVISOR [facility] [place] [place] Pharmacist Assistant  [phone]

## 2022-04-19 ENCOUNTER — Other Ambulatory Visit: Payer: Self-pay | Admitting: Family Medicine

## 2022-04-22 NOTE — Telephone Encounter (Signed)
Requested Prescriptions  Pending Prescriptions Disp Refills  . atorvastatin (LIPITOR) 40 MG tablet [Pharmacy Med Name: Atorvastatin Calcium 40 MG Oral Tablet] 100 tablet 2    Sig: TAKE 1 TABLET BY MOUTH DAILY     Cardiovascular:  Antilipid - Statins Failed - 04/19/2022 10:47 PM      Failed - Lipid Panel in normal range within the last 12 months    Cholesterol  Date Value Ref Range Status  03/22/2022 197 <200 mg/dL Final   LDL Cholesterol (Calc)  Date Value Ref Range Status  03/22/2022 99 mg/dL (calc) Final    Comment:    Reference range: <100 . Desirable range <100 mg/dL for primary prevention;   <70 mg/dL for patients with CHD or diabetic patients  with > or = 2 CHD risk factors. Marland Kitchen LDL-C is now calculated using the Martin-Hopkins  calculation, which is a validated novel method providing  better accuracy than the Friedewald equation in the  estimation of LDL-C.  Cresenciano Genre et al. Annamaria Helling. 7703;403(52): 2061-2068  (http://education.[employer].com/faq/FAQ164)    HDL  Date Value Ref Range Status  03/22/2022 74 > OR = 50 mg/dL Final   Triglycerides  Date Value Ref Range Status  03/22/2022 143 <150 mg/dL Final         Passed - Patient is not pregnant      Passed - Valid encounter within last 12 months    Recent Outpatient Visits          1 month ago Controlled type 2 diabetes mellitus with complication, without long-term current use of insulin ([place])   [place] Pickard, Cammie Mcgee, MD   4 months ago Leg swelling   [place] Pickard, Cammie Mcgee, MD   4 months ago Leg swelling   [place] Dennard Schaumann, Cammie Mcgee, MD   4 months ago Leg swelling   [place] Susy Frizzle, MD   8 months ago Acute pain of right knee   [place] Eulogio Bear, NP

## 2022-05-01 ENCOUNTER — Ambulatory Visit (INDEPENDENT_AMBULATORY_CARE_PROVIDER_SITE_OTHER): Payer: Medicare Other | Admitting: Podiatry

## 2022-05-01 ENCOUNTER — Encounter: Payer: Self-pay | Admitting: Podiatry

## 2022-05-01 DIAGNOSIS — E119 Type 2 diabetes mellitus without complications: Secondary | ICD-10-CM

## 2022-05-01 DIAGNOSIS — B351 Tinea unguium: Secondary | ICD-10-CM

## 2022-05-01 DIAGNOSIS — M79674 Pain in right toe(s): Secondary | ICD-10-CM

## 2022-05-01 DIAGNOSIS — Z794 Long term (current) use of insulin: Secondary | ICD-10-CM

## 2022-05-01 DIAGNOSIS — M79675 Pain in left toe(s): Secondary | ICD-10-CM

## 2022-05-01 NOTE — Progress Notes (Signed)
This patient returns to my office for at risk foot care.  This patient requires this care by a professional since this patient will be at risk due to having diabetes mellitus.  This patient is unable to cut nails herself since the patient cannot reach her nails.These nails are painful walking and wearing shoes.  This patient presents for at risk foot care today.  General Appearance  Alert, conversant and in no acute stress.  Vascular  Dorsalis pedis and posterior tibial  pulses are palpable  bilaterally.  Capillary return is within normal limits  bilaterally. Temperature is within normal limits  bilaterally.  Neurologic  Senn-Weinstein monofilament wire test within normal limits  bilaterally. Muscle power within normal limits bilaterally.  Nails Thick disfigured discolored nails with subungual debris  from hallux to fifth toes bilaterally. No evidence of bacterial infection or drainage bilaterally.  Orthopedic  No limitations of motion  feet .  No crepitus or effusions noted.  No bony pathology or digital deformities noted.  Skin  normotropic skin with no porokeratosis noted bilaterally.  No signs of infections or ulcers noted.     Onychomycosis  Pain in right toes  Pain in left toes  Consent was obtained for treatment procedures.   Mechanical debridement of nails 1-5  bilaterally performed with a nail nipper.  Filed with dremel without incident.    Return office visit   3 months                   Told patient to return for periodic foot care and evaluation due to potential at risk complications.   Wilfred Siverson DPM   

## 2022-05-31 ENCOUNTER — Ambulatory Visit (INDEPENDENT_AMBULATORY_CARE_PROVIDER_SITE_OTHER): Payer: Medicare Other | Admitting: Family Medicine

## 2022-05-31 VITALS — BP 120/76 | HR 80 | Temp 98.0°F | Ht 65.0 in | Wt 207.0 lb

## 2022-05-31 DIAGNOSIS — E118 Type 2 diabetes mellitus with unspecified complications: Secondary | ICD-10-CM | POA: Diagnosis not present

## 2022-05-31 NOTE — Progress Notes (Signed)
Subjective:    Patient ID: Amanda Riddle, female    DOB: [birth year], 87 y.o.   MRN: [id number] Patient is currently on 70/30 insulin.  She takes 10 units in the morning and 5 units in the evening.  Her blood sugars have been well-controlled up until the last week.  They typically average between 120 and 170.  They are usually 120 in the mornings and then 170 in the afternoons.  Given her age, I feel that this is appropriate.  Her last A1c was roughly 7 2 months ago.  However recently she developed an infection in her teeth.  She is on an antibiotic for that.  Over the last 3 days her blood sugars have been around 200.  I believe this is likely due to the stress of the infection.  Past Medical History:  Diagnosis Date   Diabetes mellitus    Diverticulosis    Hypercholesteremia    Hypertension    Past Surgical History:  Procedure Laterality Date   ABDOMINAL HYSTERECTOMY  1986   CATARACT EXTRACTION W/PHACO Right 11/12/2021   Procedure: CATARACT EXTRACTION PHACO AND INTRAOCULAR LENS PLACEMENT (IOC);  Surgeon: Baruch Goldmann, MD;  Location: [facility];  Service: Ophthalmology;  Laterality: Right;  CDE: 11.79   TONSILLECTOMY  1950   Current Outpatient Medications on File Prior to Visit  Medication Sig Dispense Refill   albuterol (VENTOLIN HFA) 108 (90 Base) MCG/ACT inhaler Inhale 2 puffs into the lungs every 6 (six) hours as needed. Shortness of breath 3 each 2   amLODipine (NORVASC) 5 MG tablet TAKE 1 TABLET BY MOUTH DAILY 100 tablet 2   aspirin EC 81 MG tablet Take 81 mg by mouth at bedtime.     atorvastatin (LIPITOR) 40 MG tablet TAKE 1 TABLET BY MOUTH DAILY 100 tablet 2   Blood Glucose Monitoring Suppl (ACCU-CHEK GUIDE) w/Device KIT USE TO CHECK BLOOD SUGAR UP TO 4 TIMES DAILY. Dx E11.9 1 kit 0   Blood Glucose Monitoring Suppl (TRUE METRIX METER) w/Device KIT      clotrimazole-betamethasone (LOTRISONE) cream Apply 1 application. topically 2 (two) times daily. 30 g 0   Cyanocobalamin (VITAMIN B  12 PO) Take 1 tablet by mouth at bedtime.     diclofenac Sodium (VOLTAREN) 1 % GEL Apply 4 g topically 4 (four) times daily. 50 g 1   Flaxseed, Linseed, (FLAXSEED OIL PO) Take 1 capsule by mouth 2 (two) times daily.      furosemide (LASIX) 40 MG tablet TAKE 1 TABLET BY MOUTH EVERY DAY 90 tablet 0   Glucosamine Sulfate-MSM (MSM-GLUCOSAMINE PO) Take 1 capsule by mouth 2 (two) times daily.     insulin isophane & regular human (NOVOLIN 70/30 FLEXPEN) (70-30) 100 UNIT/ML KwikPen INJECT 30  UNITS SUBCUTANEOUSLY IN THE MORNING AND 15 UNITS SUBCUTANEOUSLY IN THE AFTERNOON (Patient taking differently: INJECT 15 UNITS SUBCUTANEOUSLY IN THE MORNING) 15 mL 11   Insulin Syringe-Needle U-100 (B-D INS SYR ULTRAFINE .3CC/31G) 31G X 5/16" 0.3 ML MISC Use as directed to inject insulin SQ 2x daiy. Dx: E11.65 300 each 3   JANUVIA 100 MG tablet Take 1 tablet by mouth daily.     meclizine (ANTIVERT) 12.5 MG tablet TAKE 1 TABLET BY MOUTH 3 TIMES DAILY AS NEEDED FOR DIZZINESS. 40 tablet 0   meloxicam (MOBIC) 7.5 MG tablet Take 15 mg by mouth daily.     metFORMIN (GLUCOPHAGE) 1000 MG tablet TAKE 1 TABLET BY MOUTH TWICE  DAILY 160 tablet 3   pioglitazone (ACTOS)  30 MG tablet TAKE 1 TABLET BY MOUTH DAILY 100 tablet 2   potassium chloride (KLOR-CON) 10 MEQ tablet Take 1 tablet (10 mEq total) by mouth daily. 90 tablet 3   triamcinolone cream (KENALOG) 0.1 % Apply 1 application topically 2 (two) times daily. 30 g 0   TRUE METRIX BLOOD GLUCOSE TEST test strip USE TO CHECK BLOOD SUGARS THREE TIMES DAILY. DX E11.9 300 strip 1   TRUEplus Lancets 30G MISC TEST BLOOD SUGAR THREE TIMES DAILY AS DIRECTED 300 each 1   No current facility-administered medications on file prior to visit.     Allergies  Allergen Reactions   Codeine Itching   Lisinopril Swelling   Social History   Socioeconomic History   Marital status: Divorced    Spouse name: Not on file   Number of children: 2   Years of education: Not on file   Highest  education level: Not on file  Occupational History   Not on file  Tobacco Use   Smoking status: Never   Smokeless tobacco: Current    Types: Snuff  Vaping Use   Vaping Use: Never used  Substance and Sexual Activity   Alcohol use: No   Drug use: No   Sexual activity: Not Currently  Other Topics Concern   Not on file  Social History Narrative   Not on file   Social Determinants of Health   Financial Resource Strain: High Risk (03/22/2022)   Overall Financial Resource Strain (CARDIA)    Difficulty of Paying Living Expenses: Hard  Food Insecurity: Food Insecurity Present (03/26/2022)   Hunger Vital Sign    Worried About Running Out of Food in the Last Year: Sometimes true    Ran Out of Food in the Last Year: Sometimes true  Transportation Needs: No Transportation Needs (03/22/2022)   PRAPARE - Hydrologist (Medical): No    Lack of Transportation (Non-Medical): No  Physical Activity: Inactive (03/22/2022)   Exercise Vital Sign    Days of Exercise per Week: 0 days    Minutes of Exercise per Session: 0 min  Stress: No Stress Concern Present (03/22/2022)   [place]    Feeling of Stress : Only a little  Social Connections: Moderately Isolated (03/22/2022)   Social Connection and Isolation Panel [NHANES]    Frequency of Communication with Friends and Family: More than three times a week    Frequency of Social Gatherings with Friends and Family: Once a week    Attends Religious Services: 1 to 4 times per year    Active Member of [employer] or Organizations: No    Attends Archivist Meetings: Never    Marital Status: Divorced  Human resources officer Violence: Not At Risk (03/22/2022)   Humiliation, Afraid, Rape, and Kick questionnaire    Fear of Current or Ex-Partner: No    Emotionally Abused: No    Physically Abused: No    Sexually Abused: No     Review of Systems  All other systems reviewed  and are negative.      Objective:   Physical Exam Vitals reviewed.  Constitutional:      Appearance: She is obese.  Cardiovascular:     Rate and Rhythm: Normal rate and regular rhythm.     Heart sounds: Normal heart sounds. No murmur heard.    No friction rub. No gallop.  Pulmonary:     Effort: Pulmonary effort is normal. No respiratory  distress.     Breath sounds: Normal breath sounds. No stridor. No wheezing, rhonchi or rales.  Chest:     Chest wall: No tenderness.  Musculoskeletal:     Right lower leg: No edema.     Left lower leg: No edema.  Neurological:     Mental Status: She is alert.           Assessment & Plan:  Controlled type 2 diabetes mellitus with complication, without long-term current use of insulin (HCC) Increase 70/30 insulin to 13 units in the morning and 7 units in the evening.  Therefore her total daily allotment will increase from 15 units to 20 units.  I hesitate to go any higher due to the risk of hypoglycemia.  Monitor blood sugars closely.  If her blood sugars begin to drop as the infection clears she can resume her previous dose

## 2022-07-01 ENCOUNTER — Telehealth: Payer: Self-pay

## 2022-07-01 DIAGNOSIS — E119 Type 2 diabetes mellitus without complications: Secondary | ICD-10-CM | POA: Diagnosis not present

## 2022-07-01 MED ORDER — FUROSEMIDE 40 MG PO TABS: 40.0000 mg | ORAL_TABLET | Freq: Every day | ORAL | 0 refills | Status: DC

## 2022-07-01 NOTE — Addendum Note (Signed)
Addended by: Colman Cater on: 07/01/2022 10:00 AM   Modules accepted: Orders

## 2022-07-01 NOTE — Telephone Encounter (Signed)
Pharmacy faxed a refill request for furosemide (LASIX) 40 MG tablet [[id number]]    Order Details Dose, Route, Frequency: As Directed  Dispense Quantity: 90 tablet Refills: 0        Sig: TAKE 1 TABLET BY MOUTH EVERY DAY       Start Date: 03/08/22 End Date: --  Written Date: 03/08/22 Expiration Date: 03/08/23  Original Order:  furosemide (LASIX) 40 MG tablet [[id number]]

## 2022-07-01 NOTE — Addendum Note (Signed)
Addended by: Colman Cater on: 07/01/2022 11:02 AM   Modules accepted: Orders

## 2022-07-01 NOTE — Telephone Encounter (Signed)
Pharmacy faxed a refill request for furosemide (LASIX) 40 MG tablet [[id number]]    Order Details Dose, Route, Frequency: As Directed  Dispense Quantity: 90 tablet Refills: 0        Sig: TAKE 1 TABLET BY MOUTH EVERY DAY       Start Date: 03/08/22 End Date: --  Written Date: 03/08/22 Expiration Date: 03/08/23  Original Order:  furosemide (LASIX) 40 MG tablet [[id number]]

## 2022-07-04 ENCOUNTER — Other Ambulatory Visit: Payer: Medicare Other

## 2022-07-04 ENCOUNTER — Ambulatory Visit (INDEPENDENT_AMBULATORY_CARE_PROVIDER_SITE_OTHER): Payer: Medicare Other | Admitting: Family Medicine

## 2022-07-04 VITALS — BP 132/78 | HR 68 | Temp 98.0°F | Ht 65.0 in | Wt 210.0 lb

## 2022-07-04 DIAGNOSIS — I10 Essential (primary) hypertension: Secondary | ICD-10-CM

## 2022-07-04 DIAGNOSIS — Z23 Encounter for immunization: Secondary | ICD-10-CM

## 2022-07-04 DIAGNOSIS — Z794 Long term (current) use of insulin: Secondary | ICD-10-CM | POA: Diagnosis not present

## 2022-07-04 DIAGNOSIS — E782 Mixed hyperlipidemia: Secondary | ICD-10-CM

## 2022-07-04 DIAGNOSIS — E119 Type 2 diabetes mellitus without complications: Secondary | ICD-10-CM

## 2022-07-04 DIAGNOSIS — E118 Type 2 diabetes mellitus with unspecified complications: Secondary | ICD-10-CM

## 2022-07-04 NOTE — Progress Notes (Signed)
Subjective:    Patient ID: Amanda Riddle, female    DOB: [birth year], 87 y.o.   MRN: [id number] Patient is currently on 70/30 insulin.  Patient currently takes 15 units in the morning and 7 units in the evening.  She is also on metformin.  She is not taking Actos.  She is not taking Januvia.  Her blood sugars are typically between 101 150.  She has had 2 episodes over the last 3 months when her sugar dropped in the 60s.  This is usually first thing in the morning.  She had one episode where her blood sugar was over 200 however the vast majority are well controlled.  She denies any polyuria polydipsia or blurry vision.  She is quite upset today due to a scheduling issue regarding the appointment.  I apologize for that.  She is due for a flu shot.  Past Medical History:  Diagnosis Date   Diabetes mellitus    Diverticulosis    Hypercholesteremia    Hypertension    Past Surgical History:  Procedure Laterality Date   ABDOMINAL HYSTERECTOMY  1986   CATARACT EXTRACTION W/PHACO Right 11/12/2021   Procedure: CATARACT EXTRACTION PHACO AND INTRAOCULAR LENS PLACEMENT (IOC);  Surgeon: Baruch Goldmann, MD;  Location: [facility];  Service: Ophthalmology;  Laterality: Right;  CDE: 11.79   TONSILLECTOMY  1950   Current Outpatient Medications on File Prior to Visit  Medication Sig Dispense Refill   albuterol (VENTOLIN HFA) 108 (90 Base) MCG/ACT inhaler Inhale 2 puffs into the lungs every 6 (six) hours as needed. Shortness of breath 3 each 2   amLODipine (NORVASC) 5 MG tablet TAKE 1 TABLET BY MOUTH DAILY 100 tablet 2   aspirin EC 81 MG tablet Take 81 mg by mouth at bedtime.     atorvastatin (LIPITOR) 40 MG tablet TAKE 1 TABLET BY MOUTH DAILY 100 tablet 2   Blood Glucose Monitoring Suppl (ACCU-CHEK GUIDE) w/Device KIT USE TO CHECK BLOOD SUGAR UP TO 4 TIMES DAILY. Dx E11.9 1 kit 0   Blood Glucose Monitoring Suppl (TRUE METRIX METER) w/Device KIT      clotrimazole-betamethasone (LOTRISONE) cream Apply 1 application.  topically 2 (two) times daily. 30 g 0   Cyanocobalamin (VITAMIN B 12 PO) Take 1 tablet by mouth at bedtime.     diclofenac Sodium (VOLTAREN) 1 % GEL Apply 4 g topically 4 (four) times daily. 50 g 1   Flaxseed, Linseed, (FLAXSEED OIL PO) Take 1 capsule by mouth 2 (two) times daily.      furosemide (LASIX) 40 MG tablet Take 1 tablet (40 mg total) by mouth daily. 90 tablet 0   Glucosamine Sulfate-MSM (MSM-GLUCOSAMINE PO) Take 1 capsule by mouth 2 (two) times daily.     insulin isophane & regular human (NOVOLIN 70/30 FLEXPEN) (70-30) 100 UNIT/ML KwikPen INJECT 30  UNITS SUBCUTANEOUSLY IN THE MORNING AND 15 UNITS SUBCUTANEOUSLY IN THE AFTERNOON (Patient taking differently: INJECT 15 UNITS SUBCUTANEOUSLY IN THE MORNING) 15 mL 11   Insulin Syringe-Needle U-100 (B-D INS SYR ULTRAFINE .3CC/31G) 31G X 5/16" 0.3 ML MISC Use as directed to inject insulin SQ 2x daiy. Dx: E11.65 300 each 3   JANUVIA 100 MG tablet Take 1 tablet by mouth daily.     meclizine (ANTIVERT) 12.5 MG tablet TAKE 1 TABLET BY MOUTH 3 TIMES DAILY AS NEEDED FOR DIZZINESS. 40 tablet 0   meloxicam (MOBIC) 7.5 MG tablet Take 15 mg by mouth daily.     metFORMIN (GLUCOPHAGE) 1000 MG tablet TAKE  1 TABLET BY MOUTH TWICE  DAILY 160 tablet 3   pioglitazone (ACTOS) 30 MG tablet TAKE 1 TABLET BY MOUTH DAILY 100 tablet 2   potassium chloride (KLOR-CON) 10 MEQ tablet Take 1 tablet (10 mEq total) by mouth daily. 90 tablet 3   triamcinolone cream (KENALOG) 0.1 % Apply 1 application topically 2 (two) times daily. 30 g 0   TRUE METRIX BLOOD GLUCOSE TEST test strip USE TO CHECK BLOOD SUGARS THREE TIMES DAILY. DX E11.9 300 strip 1   TRUEplus Lancets 30G MISC TEST BLOOD SUGAR THREE TIMES DAILY AS DIRECTED 300 each 1   No current facility-administered medications on file prior to visit.     Allergies  Allergen Reactions   Codeine Itching   Lisinopril Swelling   Social History   Socioeconomic History   Marital status: Divorced    Spouse name: Not on  file   Number of children: 2   Years of education: Not on file   Highest education level: Not on file  Occupational History   Not on file  Tobacco Use   Smoking status: Never   Smokeless tobacco: Current    Types: Snuff  Vaping Use   Vaping Use: Never used  Substance and Sexual Activity   Alcohol use: No   Drug use: No   Sexual activity: Not Currently  Other Topics Concern   Not on file  Social History Narrative   Not on file   Social Determinants of Health   Financial Resource Strain: High Risk (03/22/2022)   Overall Financial Resource Strain (CARDIA)    Difficulty of Paying Living Expenses: Hard  Food Insecurity: Food Insecurity Present (03/26/2022)   Hunger Vital Sign    Worried About Running Out of Food in the Last Year: Sometimes true    Ran Out of Food in the Last Year: Sometimes true  Transportation Needs: No Transportation Needs (03/22/2022)   PRAPARE - Hydrologist (Medical): No    Lack of Transportation (Non-Medical): No  Physical Activity: Inactive (03/22/2022)   Exercise Vital Sign    Days of Exercise per Week: 0 days    Minutes of Exercise per Session: 0 min  Stress: No Stress Concern Present (03/22/2022)   [place]    Feeling of Stress : Only a little  Social Connections: Moderately Isolated (03/22/2022)   Social Connection and Isolation Panel [NHANES]    Frequency of Communication with Friends and Family: More than three times a week    Frequency of Social Gatherings with Friends and Family: Once a week    Attends Religious Services: 1 to 4 times per year    Active Member of [employer] or Organizations: No    Attends Archivist Meetings: Never    Marital Status: Divorced  Human resources officer Violence: Not At Risk (03/22/2022)   Humiliation, Afraid, Rape, and Kick questionnaire    Fear of Current or Ex-Partner: No    Emotionally Abused: No    Physically Abused: No     Sexually Abused: No     Review of Systems  All other systems reviewed and are negative.      Objective:   Physical Exam Vitals reviewed.  Constitutional:      Appearance: She is obese.  Cardiovascular:     Rate and Rhythm: Normal rate and regular rhythm.     Heart sounds: Normal heart sounds. No murmur heard.    No friction rub. No  gallop.  Pulmonary:     Effort: Pulmonary effort is normal. No respiratory distress.     Breath sounds: Normal breath sounds. No stridor. No wheezing, rhonchi or rales.  Chest:     Chest wall: No tenderness.  Musculoskeletal:     Right lower leg: No edema.     Left lower leg: No edema.  Neurological:     Mental Status: She is alert.          Assessment & Plan:  Controlled type 2 diabetes mellitus with complication, without long-term current use of insulin (HCC) - Plan: Hemoglobin A1c, CBC with Differential/Platelet, Lipid panel, COMPLETE METABOLIC PANEL WITH GFR, Hemoglobin A1c, Flu Vaccine QUAD 6+ mos PF IM (Fluarix Quad PF)  Influenza vaccine administered - Plan: Flu Vaccine QUAD 6+ mos PF IM (Fluarix Quad PF) I am very happy with her blood sugars overall.  Check her A1c.  Her blood pressure today is excellent.  I will check a fasting lipid panel.  Goal LDL cholesterol is less than 100.  Goal A1c is less than 7.  Monitor renal function.  I removed Januvia and Actos from her medication list.  Patient received for flu shot today.  I recommended a COVID booster.

## 2022-07-05 LAB — LIPID PANEL
Cholesterol: 177 mg/dL (ref <200)
HDL: 78 mg/dL (ref >=50)
LDL Cholesterol (Calc): 78 mg/dL (calc)
Non-HDL Cholesterol (Calc): 99 mg/dL (calc) (ref <130)
Total CHOL/HDL Ratio: 2.3 (calc) (ref <5.0)
Triglycerides: 128 mg/dL (ref <150)

## 2022-07-05 LAB — COMPREHENSIVE METABOLIC PANEL
AG Ratio: 1.7 (calc) (ref 1.0–2.5)
ALT: 8 U/L (ref 6–29)
AST: 15 U/L (ref 10–35)
Albumin: 4.2 g/dL (ref 3.6–5.1)
Alkaline phosphatase (APISO): 85 U/L (ref 37–153)
BUN/Creatinine Ratio: 15 (calc) (ref 6–22)
BUN: 15 mg/dL (ref 7–25)
CO2: 25 mmol/L (ref 20–32)
Calcium: 9.8 mg/dL (ref 8.6–10.4)
Chloride: 102 mmol/L (ref 98–110)
Creat: 1.03 mg/dL — ABNORMAL HIGH (ref 0.60–0.95)
Globulin: 2.5 g/dL (calc) (ref 1.9–3.7)
Glucose, Bld: 71 mg/dL (ref 65–99)
Potassium: 4.5 mmol/L (ref 3.5–5.3)
Sodium: 140 mmol/L (ref 135–146)
Total Bilirubin: 0.7 mg/dL (ref 0.2–1.2)
Total Protein: 6.7 g/dL (ref 6.1–8.1)

## 2022-07-05 LAB — CBC WITH DIFFERENTIAL/PLATELET
Absolute Monocytes: 329 cells/uL (ref 200–950)
Basophils Absolute: 32 cells/uL (ref 0–200)
Basophils Relative: 0.6 %
Eosinophils Absolute: 69 cells/uL (ref 15–500)
Eosinophils Relative: 1.3 %
HCT: 38.8 % (ref 35.0–45.0)
Hemoglobin: 12.7 g/dL (ref 11.7–15.5)
Lymphs Abs: 2857 cells/uL (ref 850–3900)
MCH: 29.6 pg (ref 27.0–33.0)
MCHC: 32.7 g/dL (ref 32.0–36.0)
MCV: 90.4 fL (ref 80.0–100.0)
MPV: 10.4 fL (ref 7.5–12.5)
Monocytes Relative: 6.2 %
Neutro Abs: 2014 cells/uL (ref 1500–7800)
Neutrophils Relative %: 38 %
Platelets: 235 10*3/uL (ref 140–400)
RBC: 4.29 10*6/uL (ref 3.80–5.10)
RDW: 13.4 % (ref 11.0–15.0)
Total Lymphocyte: 53.9 %
WBC: 5.3 10*3/uL (ref 3.8–10.8)

## 2022-07-05 LAB — HEMOGLOBIN A1C
Hgb A1c MFr Bld: 7.1 % of total Hgb — ABNORMAL HIGH (ref <5.7)
Mean Plasma Glucose: 157 mg/dL
eAG (mmol/L): 8.7 mmol/L

## 2022-07-24 ENCOUNTER — Ambulatory Visit: Payer: Medicare Other | Admitting: Podiatry

## 2022-07-25 IMAGING — DX DG CHEST 2V
2 series · 2 of 2 positions shown · non-contrast
Comparison: Chest radiograph 03/28/2016

CLINICAL DATA: Chest pain

EXAM:
CHEST - 2 VIEW

[chest pa]
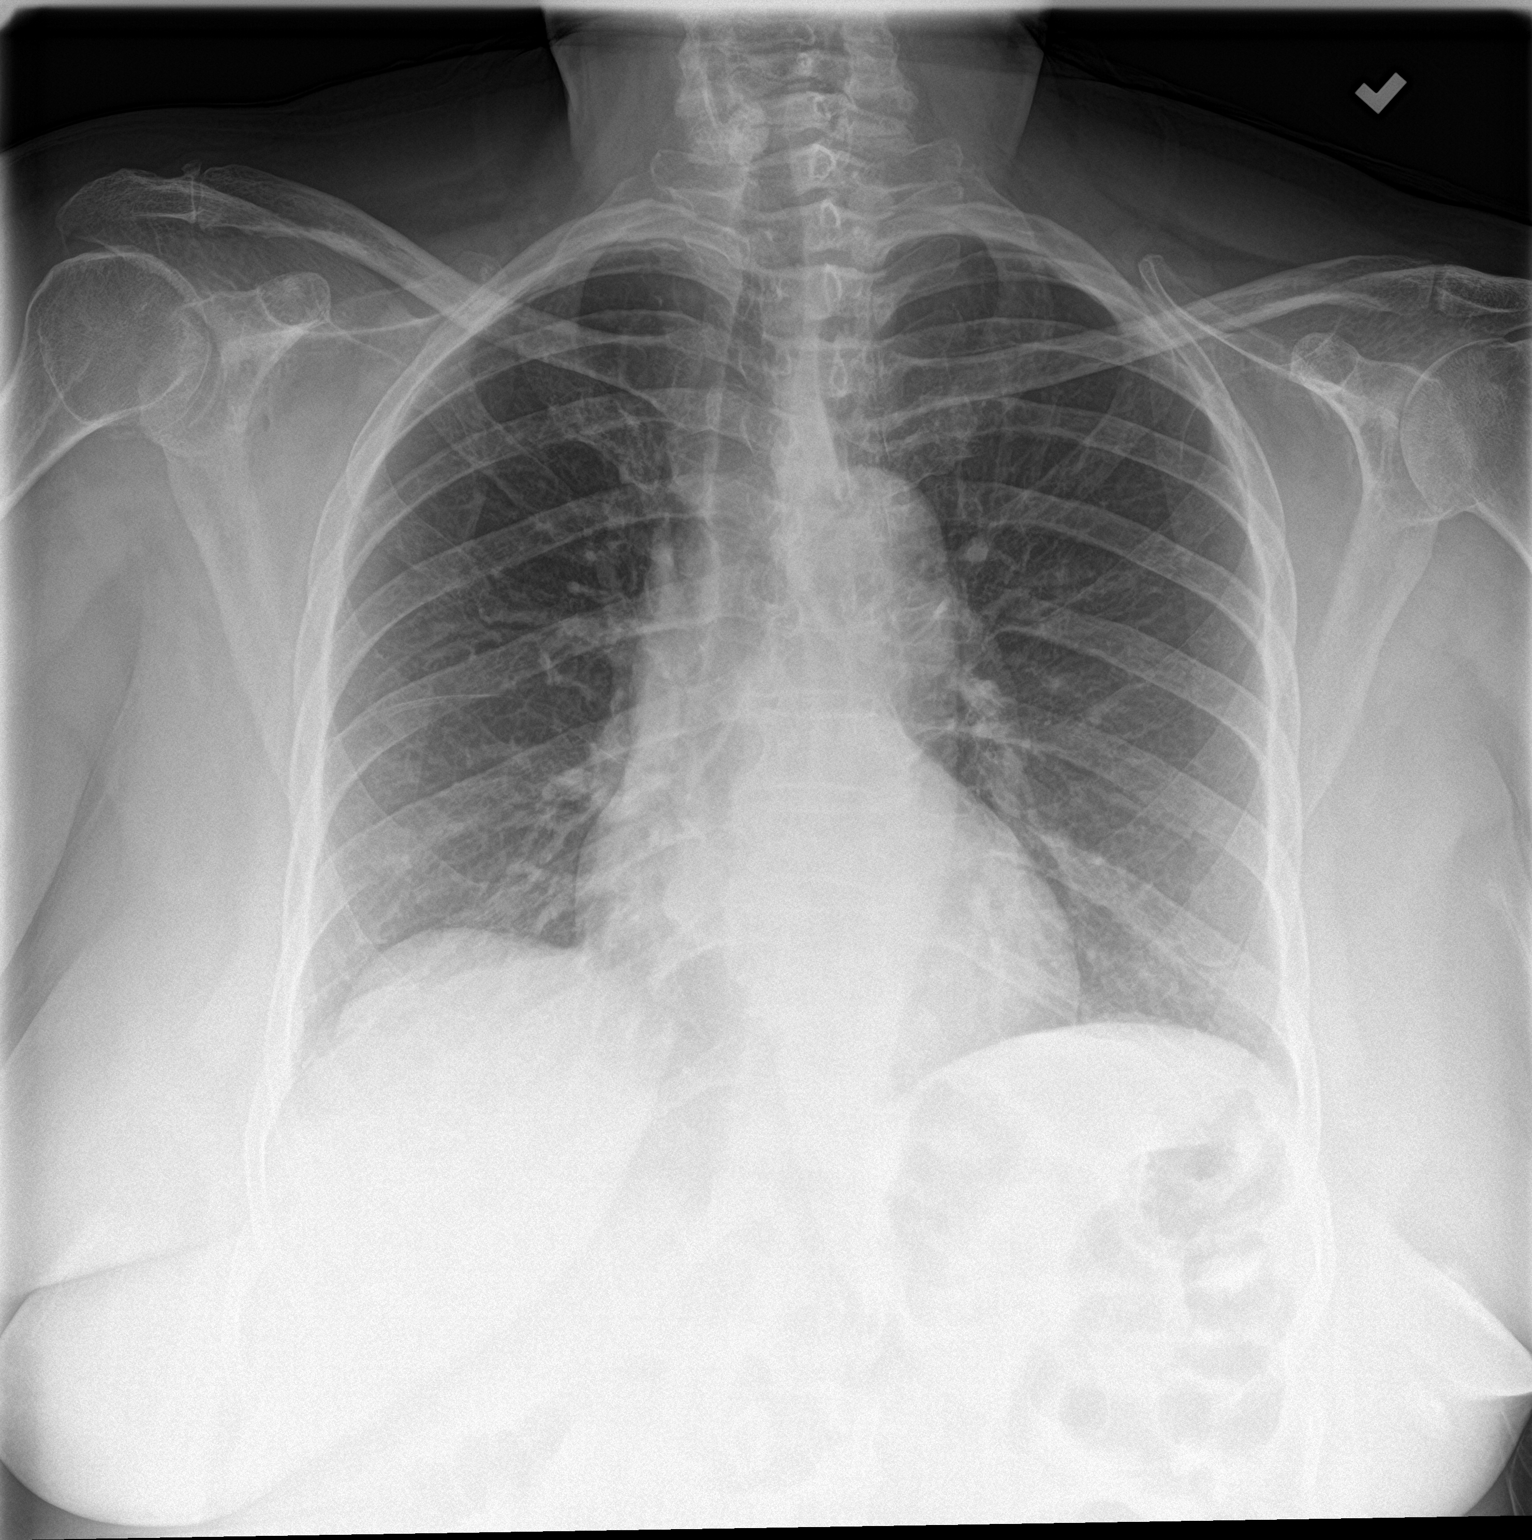

[chest lat]
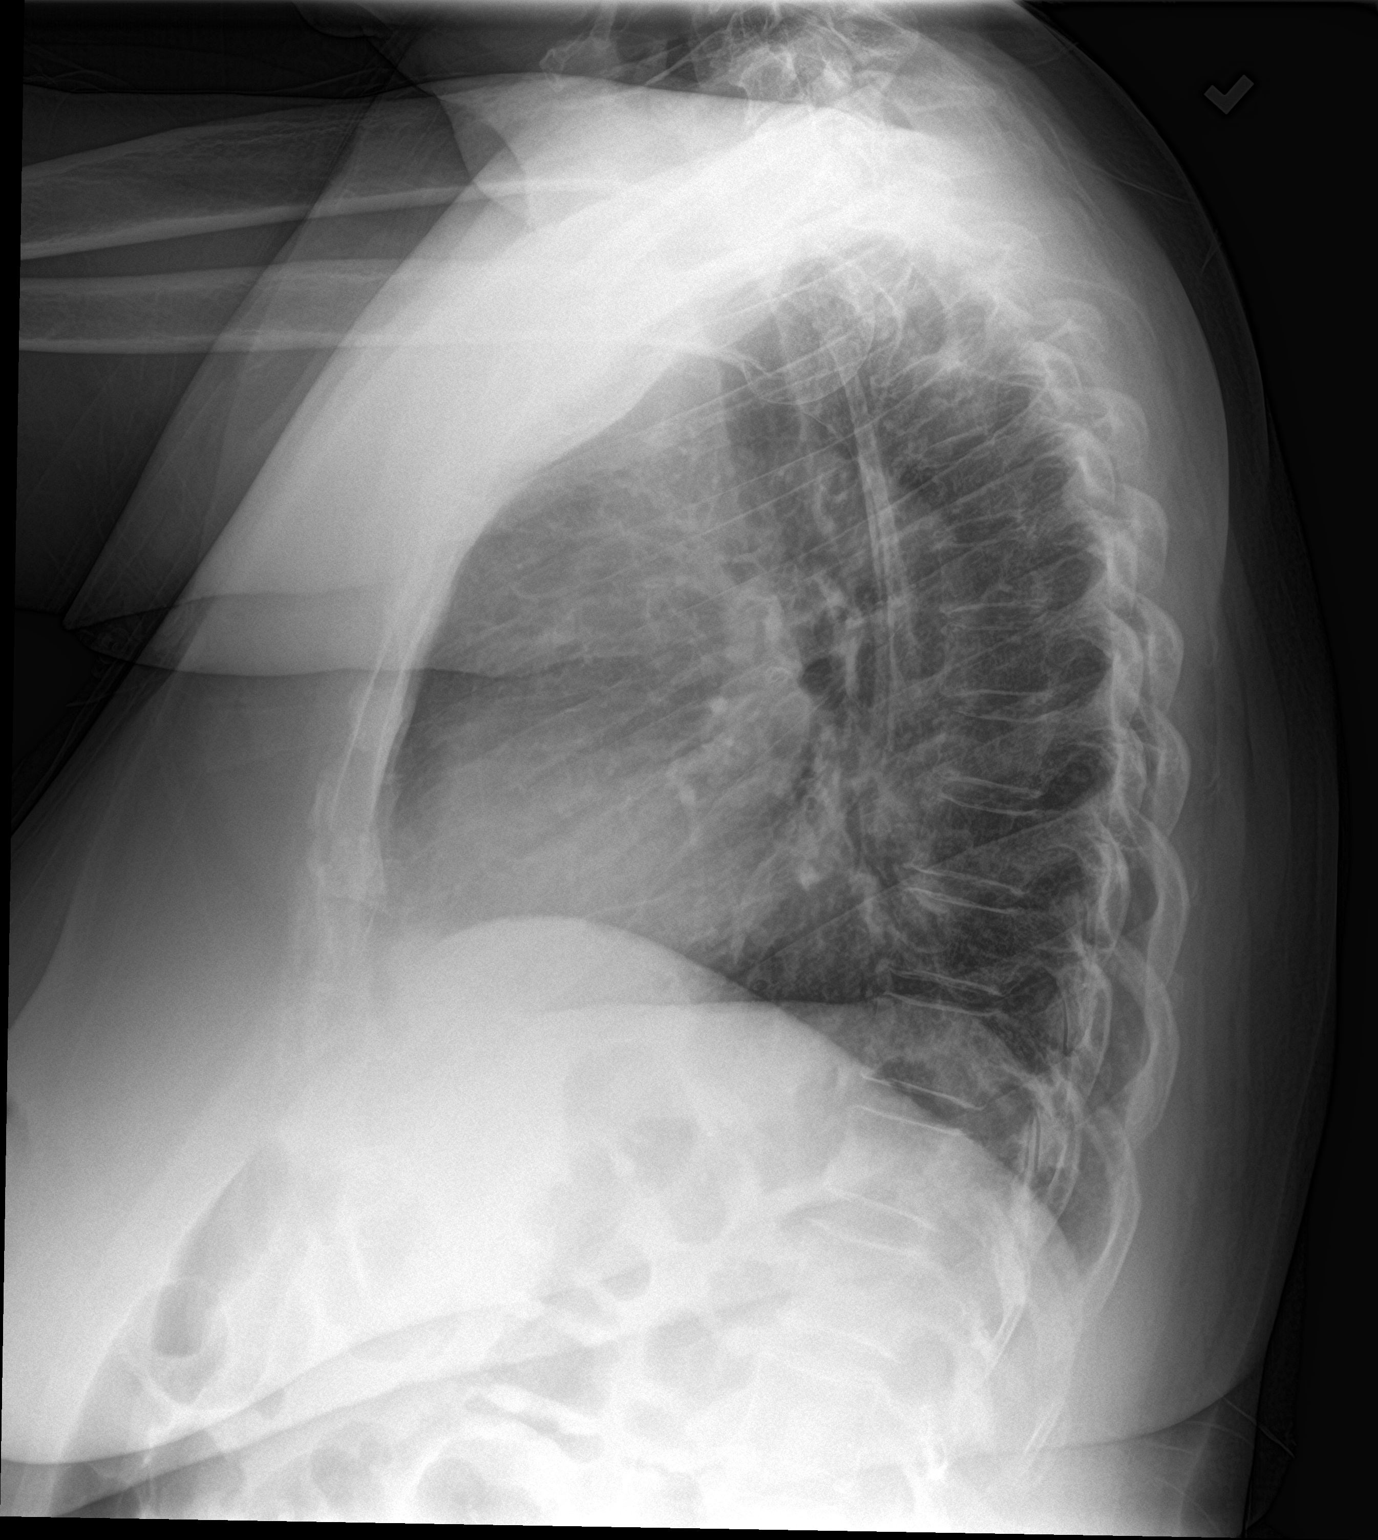

[2 of 2 positions shown; findings below may reference images not displayed]

FINDINGS: The cardiomediastinal silhouette is normal.

The lungs clear, with no focal consolidation or pulmonary edema.
There is no pleural effusion or pneumothorax.

There is no acute osseous abnormality.
IMPRESSION: No radiographic evidence of acute cardiopulmonary process.

## 2022-08-01 NOTE — Progress Notes (Deleted)
Chronic Care Management Pharmacy Note  08/01/2022 Name:  Amanda Riddle MRN:  [id number] DOB:  1935-08-19  Summary: PharmD FU.  Recommend fasting labs (A1c and Lipids) at upcoming June FU visit. No changes needed at this time.   Subjective: Amanda Riddle is an 87 y.o. year old female who is a primary patient of Pickard, Cammie Mcgee, MD.  The CCM team was consulted for assistance with disease management and care coordination needs.    Engaged with patient by telephone for initial visit in response to provider referral for pharmacy case management and/or care coordination services.   Consent to Services:  The patient was given the following information about Chronic Care Management services today, agreed to services, and gave verbal consent: 1. CCM service includes personalized support from designated clinical staff supervised by the primary care provider, including individualized plan of care and coordination with other care providers 2. 24/7 contact phone numbers for assistance for urgent and routine care needs. 3. Service will only be billed when office clinical staff spend 20 minutes or more in a month to coordinate care. 4. Only one practitioner may furnish and bill the service in a calendar month. 5.The patient may stop CCM services at any time (effective at the end of the month) by phone call to the office staff. 6. The patient will be responsible for cost sharing (co-pay) of up to 20% of the service fee (after annual deductible is met). Patient agreed to services and consent obtained.  Patient Care Team: Susy Frizzle, MD as PCP - General (Family Medicine) [name], [facility] as Pharmacist (Pharmacist)  Recent office visits:  07/02/21 Dennard Schaumann) - FU from hospital recovering well from [place], patient to watch for rebound symptoms. 03/01/21 (Telephone) Dr. Dennard Schaumann Per note: Resume Novolin 70/30 15U SQ Q AM. 02/15/21 Dr. Dennard Schaumann For follow-up. STARTED Sitagliptin Phosphate 100 mg  daily. 02/08/21 Dr. Dennard Schaumann For Bru[place]. Per note:  Dr. Dennard Schaumann recommended she stop her insulin and record her fasting blood sugars every morning and her 2-hour postprandial sugars every evening.  Report those values to me in 1 week.  If her morning sugars are between 101 130 and her 2-hour postprandial sugars are between 101 180, I will not start her back on insulin 02/07/21 (Telephone) Dr. Dennard Schaumann Per note: Decrease 70/30 to 20 in the am and 7 in the pm.   01/22/21 Eulogio Bear, NP. For follow-up. No medication changes. 11/16/20 Dr. Dennard Schaumann For follow-up. Per note: stop hydrochlorothiazide and increase amlodipine to 10 mg a day.   Recent consult visits:  None in the last six months   Hospital visits:  None in the last six months   Medication History: Januvia 100 mg 30 DS 02/15/21 Pioglitazone 30 mg 90 DS 02/06/21 Atorvastatin 50 mg 90 DS 02/02/21 Metformin 1000 mg 90 DS 01/16/21  Objective:  Lab Results  Component Value Date   CREATININE 1.03 (H) 07/04/2022   BUN 15 07/04/2022   GFRNONAA 52 (L) 06/27/2021   GFRAA 58 (L) 02/08/2021   NA 140 07/04/2022   K 4.5 07/04/2022   CALCIUM 9.8 07/04/2022   CO2 25 07/04/2022   GLUCOSE 71 07/04/2022    Lab Results  Component Value Date/Time   HGBA1C 7.1 (H) 07/04/2022 08:04 AM   HGBA1C 7.3 (H) 03/22/2022 08:50 AM   MICROALBUR 0.4 03/22/2022 08:50 AM   MICROALBUR 0.4 05/17/2021 12:16 PM    Last diabetic Eye exam:  Lab Results  Component Value Date/Time   HMDIABEYEEXA  No Retinopathy 04/24/2021 11:54 AM    Last diabetic Foot exam: No results found for: "HMDIABFOOTEX"   Lab Results  Component Value Date   CHOL 177 07/04/2022   HDL 78 07/04/2022   LDLCALC 78 07/04/2022   TRIG 128 07/04/2022   CHOLHDL 2.3 07/04/2022       Latest Ref Rng & Units 07/04/2022    8:04 AM 03/22/2022    8:50 AM 05/17/2021   12:16 PM  Hepatic Function  Total Protein 6.1 - 8.1 g/dL 6.7  6.9  6.7   AST 10 - 35 U/L '15  16  17   ' ALT 6 - 29 U/L '8  12   11   ' Total Bilirubin 0.2 - 1.2 mg/dL 0.7  1.1  1.2     No results found for: "TSH", "FREET4"     Latest Ref Rng & Units 07/04/2022    8:04 AM 03/22/2022    8:50 AM 06/27/2021   10:41 AM  CBC  WBC 3.8 - 10.8 Thousand/uL 5.3  5.4  5.9   Hemoglobin 11.7 - 15.5 g/dL 12.7  12.4  11.7   Hematocrit 35.0 - 45.0 % 38.8  37.9  35.5   Platelets 140 - 400 Thousand/uL 235  264  245     No results found for: "VD25OH"  Clinical ASCVD: No  The ASCVD Risk score (Arnett DK, et al., 2019) failed to calculate for the following reasons:   The 2019 ASCVD risk score is only valid for ages 88 to 52       07/04/2022    8:55 AM 03/22/2022    8:57 AM 03/09/2021   10:07 AM  Depression screen PHQ 2/9  Decreased Interest 0 0 2  Down, Depressed, Hopeless 0 0 1  PHQ - 2 Score 0 0 3  Altered sleeping   3  Tired, decreased energy   2  Change in appetite   3  Feeling bad or failure about yourself    0  Trouble concentrating   0  Moving slowly or fidgety/restless   0  Suicidal thoughts   0  PHQ-9 Score   11  Difficult doing work/chores   Somewhat difficult      Social History   Tobacco Use  Smoking Status Never  Smokeless Tobacco Current   Types: Snuff   BP Readings from Last 3 Encounters:  07/04/22 132/78  05/31/22 120/76  03/22/22 120/78   Pulse Readings from Last 3 Encounters:  07/04/22 68  05/31/22 80  03/22/22 74   Wt Readings from Last 3 Encounters:  07/04/22 210 lb (95.3 kg)  05/31/22 207 lb (93.9 kg)  03/22/22 212 lb (96.2 kg)   BMI Readings from Last 3 Encounters:  07/04/22 34.95 kg/m  05/31/22 34.45 kg/m  03/22/22 35.28 kg/m    Assessment/Interventions: Review of patient past medical history, allergies, medications, health status, including review of consultants reports, laboratory and other test data, was performed as part of comprehensive evaluation and provision of chronic care management services.   SDOH:  (Social Determinants of Health) assessments and interventions  performed: Yes SDOH Interventions    Flowsheet Row Telephone from 03/26/2022 in [place] from 03/22/2022 in [place] from 03/09/2021 in [place] Interventions     Food Insecurity Interventions [id number] Referral, Other (Comment)  [Placed Meals on Wheels referral.  Mailed food bank list to patient.] [id number] Referral Intervention Not Indicated  Housing Interventions -- Intervention  Not Indicated Intervention Not Indicated  Transportation Interventions -- Intervention Not Indicated Intervention Not Indicated  Depression Interventions/Treatment  -- -- Patient refuses Treatment  Financial Strain Interventions -- [id number] Referral Intervention Not Indicated  Physical Activity Interventions -- Other (Comments)  [Has not exercised since knee injury.] Intervention Not Indicated  Stress Interventions -- [employer] Intervention Not Indicated  Social Connections Interventions -- Patient Refused  [Pt states she is a "homebody" and likes to be at home. Pt states she does talk to her family on the phone.] Intervention Not Indicated      Financial Resource Strain: High Risk (03/22/2022)   Overall Financial Resource Strain (CARDIA)    Difficulty of Paying Living Expenses: Hard    SDOH Screenings   Food Insecurity: Food Insecurity Present (03/26/2022)  Housing: Low Risk  (03/22/2022)  Transportation Needs: No Transportation Needs (03/22/2022)  Alcohol Screen: Low Risk  (03/22/2022)  Depression (PHQ2-9): Low Risk  (07/04/2022)  Financial Resource Strain: High Risk (03/22/2022)  Physical Activity: Inactive (03/22/2022)  Social Connections: Moderately Isolated (03/22/2022)  Stress: No Stress Concern Present (03/22/2022)  Tobacco Use: High Risk (05/01/2022)    CCM Care Plan  Allergies  Allergen Reactions   Codeine Itching   Lisinopril Swelling    Medications Reviewed Today     Reviewed by  Susy Frizzle, MD (Physician) on 07/04/22 at [place] List Status: <None>   Medication Order Taking? Sig Documenting Provider Last Dose Status Informant  albuterol (VENTOLIN HFA) 108 (90 Base) MCG/ACT inhaler [id number] Yes Inhale 2 puffs into the lungs every 6 (six) hours as needed. Shortness of breath Susy Frizzle, MD Taking Active   amLODipine (NORVASC) 5 MG tablet [id number] Yes TAKE 1 TABLET BY MOUTH DAILY Susy Frizzle, MD Taking Active   aspirin EC 81 MG tablet [id number] Yes Take 81 mg by mouth at bedtime. [provider] Taking Active Self  atorvastatin (LIPITOR) 40 MG tablet [id number] Yes TAKE 1 TABLET BY MOUTH DAILY Susy Frizzle, MD Taking Active   Blood Glucose Monitoring Suppl (ACCU-CHEK GUIDE) w/Device KIT [id number] Yes USE TO CHECK BLOOD SUGAR UP TO 4 TIMES DAILY. Dx E11.9 Susy Frizzle, MD Taking Active   Blood Glucose Monitoring Suppl (TRUE METRIX METER) w/Device Drucie Opitz [id number] Yes  [provider] Taking Active   clotrimazole-betamethasone (LOTRISONE) cream [id number] Yes Apply 1 application. topically 2 (two) times daily. Susy Frizzle, MD Taking Active   Cyanocobalamin (VITAMIN B 12 PO) [id number] Yes Take 1 tablet by mouth at bedtime. [provider] Taking Active Self  diclofenac Sodium (VOLTAREN) 1 % GEL [id number] Yes Apply 4 g topically 4 (four) times daily. Eulogio Bear, NP Taking Active   Flaxseed, Linseed, (FLAXSEED OIL PO) [id number] Yes Take 1 capsule by mouth 2 (two) times daily.  [provider] Taking Active Self  furosemide (LASIX) 40 MG tablet [id number] Yes Take 1 tablet (40 mg total) by mouth daily. Susy Frizzle, MD Taking Active   Glucosamine Sulfate-MSM (MSM-GLUCOSAMINE PO) [id number] Yes Take 1 capsule by mouth 2 (two) times daily. [provider] Taking Active Self  insulin isophane & regular human (NOVOLIN 70/30 FLEXPEN) (70-30) 100 UNIT/ML KwikPen [id number] Yes INJECT 30  UNITS  SUBCUTANEOUSLY IN THE MORNING AND 15 UNITS SUBCUTANEOUSLY IN THE AFTERNOON  Patient taking differently: INJECT 15 UNITS SUBCUTANEOUSLY IN THE MORNING and 7 at night   Susy Frizzle, MD Taking Active   Insulin Syringe-Needle U-100 (B-D INS SYR ULTRAFINE .3CC/31G) 31G X 5/16"  0.3 ML MISC [id number] Yes Use as directed to inject insulin SQ 2x daiy. Dx: E11.65 Eulogio Bear, NP Taking Active   JANUVIA 100 MG tablet [id number] No Take 1 tablet by mouth daily.  Patient not taking: Reported on 07/04/2022   [provider] Not Taking Active   meclizine (ANTIVERT) 12.5 MG tablet [id number] Yes TAKE 1 TABLET BY MOUTH 3 TIMES DAILY AS NEEDED FOR DIZZINESS. Susy Frizzle, MD Taking Active   meloxicam [facility]) 7.5 MG tablet [id number] Yes Take 15 mg by mouth daily. [provider] Taking Active   metFORMIN (GLUCOPHAGE) 1000 MG tablet [id number] Yes TAKE 1 TABLET BY MOUTH TWICE  DAILY Susy Frizzle, MD Taking Active   pioglitazone (ACTOS) 30 MG tablet [id number] No TAKE 1 TABLET BY MOUTH DAILY  Patient not taking: Reported on 07/04/2022   Susy Frizzle, MD Not Taking Active   potassium chloride (KLOR-CON) 10 MEQ tablet [id number] Yes Take 1 tablet (10 mEq total) by mouth daily. Susy Frizzle, MD Taking Active   triamcinolone cream (KENALOG) 0.1 % [id number] Yes Apply 1 application topically 2 (two) times daily. Susy Frizzle, MD Taking Active   TRUE METRIX BLOOD GLUCOSE TEST test strip [id number] Yes USE TO CHECK BLOOD SUGARS THREE TIMES DAILY. DX E11.9 Susy Frizzle, MD Taking Active   TRUEplus Lancets 30G MISC [id number] Yes TEST BLOOD SUGAR THREE TIMES DAILY AS DIRECTED Susy Frizzle, MD Taking Active             Patient Active Problem List   Diagnosis Date Noted   Pain due to onychomycosis of toenails of both feet 05/29/2021   Great toe pain, right 10/25/2020   Pain in joint, shoulder region 07/06/2013   Muscle spasms of neck 07/06/2013   Hyperglycemia  01/17/2012   HTN (hypertension) 01/17/2012   Hyperlipidemia 01/17/2012   DM (diabetes mellitus) ([place]) 01/17/2012    Immunization History  Administered Date(s) Administered   Fluad Quad(high Dose 65+) 07/23/2019, 08/17/2020, 09/25/2021   Influenza, High Dose Seasonal PF 08/15/2017, 08/14/2018   Influenza,inj,Quad PF,6+ Mos 07/05/2013, 07/05/2014, 08/10/2015, 08/26/2016, 07/04/2022   Influenza-Unspecified 07/19/2022   PFIZER(Purple Top)SARS-COV-2 Vaccination 04/14/2020, 05/05/2020   Pfizer Covid-19 Vaccine Bivalent Booster 21yr & up 10/15/2021   Pneumococcal Conjugate-13 11/02/2013   Pneumococcal Polysaccharide-23 05/09/2015   Zoster Recombinat (Shingrix) 02/05/2022    Conditions to be addressed/monitored:  HTN, DM, HLD?  There are no care plans that you recently modified to display for this patient.       Medication Assistance: Application for Januvia  medication assistance program. in process.  Anticipated assistance start date unknown.  See plan of care for additional detail.  Compliance/Adherence/Medication fill history: Care Gaps: Eye exam needed  Star-Rating Drugs: Januvia 100 mg 30 DS 02/15/21 Pioglitazone 30 mg 90 DS 02/06/21 Atorvastatin 50 mg 90 DS 02/02/21 Metformin 1000 mg 90 DS 01/16/21  Patient's preferred pharmacy is:  [id number] [place] [facility] [address]: [phone]: [phone] - [address] [address]: [phone]: [phone] [address] [id number]: [phone]: [phone] Uses pill box? Yes Pt endorses 100% compliance  We discussed: Current pharmacy is preferred with insurance plan and patient is satisfied  with pharmacy  services Patient decided to: Continue current medication management strategy  Care Plan and Follow Up Patient Decision:  Patient agrees to Care Plan and Follow-up.  Plan: The care management team will reach out to the patient again over the next 90 days.  Beverly Milch, PharmD Clinical Pharmacist [facility] [phone]    Current Barriers:  Unable to independently afford treatment regimen Unable to independently monitor therapeutic efficacy Unable to achieve control of glucose.   Pharmacist Clinical Goal(s):  Patient will verbalize ability to afford treatment regimen achieve control of glucose as evidenced by monitoring/A1c adhere to prescribed medication regimen as evidenced by fill dates through collaboration with PharmD and provider.   Interventions: 1:1 collaboration with Susy Frizzle, MD regarding development and update of comprehensive plan of care as evidenced by provider attestation and co-signature Inter-disciplinary care team collaboration (see longitudinal plan of care) Comprehensive medication review performed; medication list updated in electronic medical record  Hypertension (BP goal <140/90) -Controlled -Current treatment: Amlodipine 36m daily HCTZ 216mdaily -Medications previously tried: valsartan  -Current home readings: not checking -Current dietary habits: patient reports her calorie intake has not been what it used to be.  She likes to snack all day.   -Current exercise habits: minimal/none.  Walks outside only when family comes to visit. -Denies hypotensive/hypertensive symptoms -Educated on BP goals and benefits of medications for prevention of heart attack, stroke and kidney damage; Exercise goal of 150 minutes per week; Importance of home blood pressure monitoring; Symptoms of hypotension and importance of maintaining adequate hydration; -Counseled to monitor BP at home whenever able, document, and provide log at future  appointments -Counseled on diet and exercise extensively Recommended to continue current medication Recommended implement some exercise plan, work her way up to 30 minutes 5 times per week.  Will provide chair exercises for her to do so that she does not have to walk outside alone.  Hyperlipidemia: (LDL goal < 70) -Not ideally controlled -Current treatment: Atorvastatin 4010mppropriate, Query effective,  -Medications previously tried: none noted  -Current dietary patterns: see above -Current exercise habits: minimal -Educated on Cholesterol goals;  Benefits of statin for ASCVD risk reduction; Importance of limiting foods high in cholesterol; -Recommended to continue current medication Recommended she switch to night time dosing to see if this will help bring down LDL any more.  Wrote it on her bottle to take at night.  Update 08/02/21 Continues to take at night, has not had repeat lipid panel since she made that switch.  Would recommend repeat lipid panel to evaluate efficacy of current dose.  Continue current meds for now  Update 04/20/233 Due for repeat lipid panel.  Unsure if new dose of Lipitor is effective. Has upcoming OV with PCP June 2nd.  Recommend come fasting so that they can check labs at that time. Denies any adverse effects. Continue medication and can adjust based on next lab results.   Diabetes (A1c goal <7%) -Not ideally controlled -Current medications: Metformin 1000m67mD Appropriate, Effective, Safe, Accessible Insulin NPH 70-30 15 units qam Appropriate, Effective, Safe, Accessible -Medications previously tried: none noted  -Current home glucose readings glucose: did not have her logs, reports it has been in the 70s once recently but this was near bed time.  Does not seem as if she checks consistently -Reports hypoglycemic/hyperglycemic symptoms - sometimes has heart beating fast and wakes up at night.   -Current meal patterns:  Reports appetite has been  down lately.  Not eating much besides salads, fruit, unless she is at a family get together. -Current exercise: minimal -Educated on A1c and blood sugar goals; Complications of diabetes including kidney damage, retinal damage, and cardiovascular disease; Prevention and management of hypoglycemic episodes; Benefits of routine self-monitoring of blood sugar; -Counseled to check feet daily and get yearly eye exams -Counseled on diet and exercise extensively Recommended to continue current medication Assessed patient finances. She reports high copay on Januvia.  Based on her reported income she will be a candidate for [employer] PAP program.   Application provided, patient to get income verification and return completed application.  Update 08/02/21 She has not been checking glucose at home since her knee pain.  Have asked her to check at least a few times per week.  She needs updated A1c since has been approx. Three months off of Januvia.  Insulin dose has remained unchanged at 15 units qam.  She reports no episodes of hypoglycemia even though her appetite has decreased. Would recommend repeat A1c - then if elevated proceed with [place] application as we still need proof of income requirement completed.  Update 02/07/22 Continues to take only metformin and insulin.  She reports she is checking her glucose occasionally.  Last time she checked a morning glucose it was around 120. Denies any hypoglycemia.  She still does not have much of an appetite.  When she does eat it is mainly whatever she can get her hands on. Recommend recheck A1c.  Would hesitate to resume pioglitazone due to fluid retention and her reports of swelling currently.  Patient Goals/Self-Care Activities Patient will:  - take medications as prescribed focus on medication adherence by pill box check glucose at least once daily (would prefer twice), document, and provide at future appointments target a minimum of 150 minutes of moderate  intensity exercise weekly  Follow Up Plan: The care management team will reach out to the patient again over the next 90 days.

## 2022-08-15 ENCOUNTER — Telehealth: Payer: Self-pay

## 2022-09-11 ENCOUNTER — Other Ambulatory Visit: Payer: Self-pay | Admitting: Family Medicine

## 2022-10-03 ENCOUNTER — Ambulatory Visit (INDEPENDENT_AMBULATORY_CARE_PROVIDER_SITE_OTHER): Payer: Medicare Other | Admitting: Family Medicine

## 2022-10-03 VITALS — BP 128/64 | HR 73 | Ht 65.0 in | Wt 211.0 lb

## 2022-10-03 DIAGNOSIS — E782 Mixed hyperlipidemia: Secondary | ICD-10-CM

## 2022-10-03 DIAGNOSIS — I10 Essential (primary) hypertension: Secondary | ICD-10-CM | POA: Diagnosis not present

## 2022-10-03 DIAGNOSIS — E118 Type 2 diabetes mellitus with unspecified complications: Secondary | ICD-10-CM

## 2022-10-03 NOTE — Progress Notes (Signed)
Subjective:    Patient ID: Amanda Riddle, female    DOB: 07-21-1935, 87 y.o.   MRN: [id number]  Patient is here today for follow-up of her diabetes.  She is currently on 70/30 insulin 15 units in the morning 7 units in the evening.  She often forgets to take 7 units in the evening and occasionally has hypoglycemia in the middle of the night because she takes the 7 units late after she eats dinner and before she goes to bed.  Her blood sugars range between 60 and 150 with the vast majority between 101 140.  Her hypoglycemic episodes are always at night or early in the morning.  This usually occurs when she takes her 7 units of insulin late in the evening or forgets to eat. Past Medical History:  Diagnosis Date   Diabetes mellitus    Diverticulosis    Hypercholesteremia    Hypertension    Past Surgical History:  Procedure Laterality Date   ABDOMINAL HYSTERECTOMY  1986   CATARACT EXTRACTION W/PHACO Right 11/12/2021   Procedure: CATARACT EXTRACTION PHACO AND INTRAOCULAR LENS PLACEMENT (IOC);  Surgeon: Baruch Goldmann, MD;  Location: [facility];  Service: Ophthalmology;  Laterality: Right;  CDE: 11.79   TONSILLECTOMY  1950   Current Outpatient Medications on File Prior to Visit  Medication Sig Dispense Refill   albuterol (VENTOLIN HFA) 108 (90 Base) MCG/ACT inhaler Inhale 2 puffs into the lungs every 6 (six) hours as needed. Shortness of breath 3 each 2   amLODipine (NORVASC) 5 MG tablet TAKE 1 TABLET BY MOUTH DAILY 100 tablet 2   aspirin EC 81 MG tablet Take 81 mg by mouth at bedtime.     atorvastatin (LIPITOR) 40 MG tablet TAKE 1 TABLET BY MOUTH DAILY 100 tablet 2   Blood Glucose Monitoring Suppl (ACCU-CHEK GUIDE) w/Device KIT USE TO CHECK BLOOD SUGAR UP TO 4 TIMES DAILY. Dx E11.9 1 kit 0   Blood Glucose Monitoring Suppl (TRUE METRIX METER) w/Device KIT      clotrimazole-betamethasone (LOTRISONE) cream APPLY 1 APPLICATION TOPICALLY TWICE A DAY 30 g 0   Cyanocobalamin (VITAMIN B 12 PO) Take 1  tablet by mouth at bedtime.     diclofenac Sodium (VOLTAREN) 1 % GEL Apply 4 g topically 4 (four) times daily. 50 g 1   Flaxseed, Linseed, (FLAXSEED OIL PO) Take 1 capsule by mouth 2 (two) times daily.      furosemide (LASIX) 40 MG tablet TAKE 1 TABLET BY MOUTH EVERY DAY 90 tablet 0   Glucosamine Sulfate-MSM (MSM-GLUCOSAMINE PO) Take 1 capsule by mouth 2 (two) times daily.     insulin isophane & regular human (NOVOLIN 70/30 FLEXPEN) (70-30) 100 UNIT/ML KwikPen INJECT 30  UNITS SUBCUTANEOUSLY IN THE MORNING AND 15 UNITS SUBCUTANEOUSLY IN THE AFTERNOON (Patient taking differently: INJECT 15 UNITS SUBCUTANEOUSLY IN THE MORNING and 7 at night) 15 mL 11   Insulin Syringe-Needle U-100 (B-D INS SYR ULTRAFINE .3CC/31G) 31G X 5/16" 0.3 ML MISC Use as directed to inject insulin SQ 2x daiy. Dx: E11.65 300 each 3   meclizine (ANTIVERT) 12.5 MG tablet TAKE 1 TABLET BY MOUTH 3 TIMES DAILY AS NEEDED FOR DIZZINESS. 40 tablet 0   meloxicam (MOBIC) 7.5 MG tablet Take 15 mg by mouth daily.     metFORMIN (GLUCOPHAGE) 1000 MG tablet TAKE 1 TABLET BY MOUTH TWICE  DAILY 160 tablet 3   potassium chloride (KLOR-CON) 10 MEQ tablet Take 1 tablet (10 mEq total) by mouth daily. 90 tablet 3  triamcinolone cream (KENALOG) 0.1 % Apply 1 application topically 2 (two) times daily. 30 g 0   TRUE METRIX BLOOD GLUCOSE TEST test strip USE TO CHECK BLOOD SUGARS THREE TIMES DAILY. DX E11.9 300 strip 1   TRUEplus Lancets 30G MISC TEST BLOOD SUGAR THREE TIMES DAILY AS DIRECTED 300 each 1   No current facility-administered medications on file prior to visit.     Allergies  Allergen Reactions   Codeine Itching   Lisinopril Swelling   Social History   Socioeconomic History   Marital status: Divorced    Spouse name: Not on file   Number of children: 2   Years of education: Not on file   Highest education level: Not on file  Occupational History   Not on file  Tobacco Use   Smoking status: Never   Smokeless tobacco: Current     Types: Snuff  Vaping Use   Vaping Use: Never used  Substance and Sexual Activity   Alcohol use: No   Drug use: No   Sexual activity: Not Currently  Other Topics Concern   Not on file  Social History Narrative   Not on file   Social Determinants of Health   Financial Resource Strain: High Risk (03/22/2022)   Overall Financial Resource Strain (CARDIA)    Difficulty of Paying Living Expenses: Hard  Food Insecurity: Food Insecurity Present (03/26/2022)   Hunger Vital Sign    Worried About Running Out of Food in the Last Year: Sometimes true    Ran Out of Food in the Last Year: Sometimes true  Transportation Needs: No Transportation Needs (03/22/2022)   PRAPARE - Hydrologist (Medical): No    Lack of Transportation (Non-Medical): No  Physical Activity: Inactive (03/22/2022)   Exercise Vital Sign    Days of Exercise per Week: 0 days    Minutes of Exercise per Session: 0 min  Stress: No Stress Concern Present (03/22/2022)   [place]    Feeling of Stress : Only a little  Social Connections: Moderately Isolated (03/22/2022)   Social Connection and Isolation Panel [NHANES]    Frequency of Communication with Friends and Family: More than three times a week    Frequency of Social Gatherings with Friends and Family: Once a week    Attends Religious Services: 1 to 4 times per year    Active Member of [employer] or Organizations: No    Attends Archivist Meetings: Never    Marital Status: Divorced  Human resources officer Violence: Not At Risk (03/22/2022)   Humiliation, Afraid, Rape, and Kick questionnaire    Fear of Current or Ex-Partner: No    Emotionally Abused: No    Physically Abused: No    Sexually Abused: No     Review of Systems  All other systems reviewed and are negative.      Objective:   Physical Exam Vitals reviewed.  Constitutional:      Appearance: She is obese.   Cardiovascular:     Rate and Rhythm: Normal rate and regular rhythm.     Heart sounds: Normal heart sounds. No murmur heard.    No friction rub. No gallop.  Pulmonary:     Effort: Pulmonary effort is normal. No respiratory distress.     Breath sounds: Normal breath sounds. No stridor. No wheezing, rhonchi or rales.  Chest:     Chest wall: No tenderness.  Musculoskeletal:     Right  lower leg: No edema.     Left lower leg: No edema.  Neurological:     Mental Status: She is alert.           Assessment & Plan:  Controlled type 2 diabetes mellitus with complication, without long-term current use of insulin (HCC) - Plan: Hemoglobin A1c, COMPLETE METABOLIC PANEL WITH GFR, Lipid panel, Protein / Creatinine Ratio, Urine  Mixed hyperlipidemia  Hypertension, unspecified type Her blood pressure is.  I am concerned about hypoglycemia.  I explained to the patient I feel that we can transition her away from insulin to Ozempic but the cost is too much for the patient.  I would prefer her to be on a once daily insulin such as glargine first thing in the morning.  However the patient wants to continue to use her current supply of insulin because she has insulin enough for 6 months.  Therefore I emphasized that she needs to take the 7 units of insulin with dinner.  I do not want her to wait till late May take a minimum of 6 pounds hypoglycemic episodes.  I explained to the patient that if she forgets to take her insulin with dinner she should skip that dose of insulin in the evening.  I would prefer to transition her to a once daily insulin.  She will call me if she changes her mind

## 2022-10-04 LAB — COMPLETE METABOLIC PANEL WITH GFR
AG Ratio: 1.5 (calc) (ref 1.0–2.5)
ALT: 9 U/L (ref 6–29)
AST: 28 U/L (ref 10–35)
Albumin: 4.3 g/dL (ref 3.6–5.1)
Alkaline phosphatase (APISO): 77 U/L (ref 37–153)
BUN: 12 mg/dL (ref 7–25)
CO2: 21 mmol/L (ref 20–32)
Calcium: 9.8 mg/dL (ref 8.6–10.4)
Chloride: 104 mmol/L (ref 98–110)
Creat: 0.91 mg/dL (ref 0.60–0.95)
Globulin: 2.9 g/dL (calc) (ref 1.9–3.7)
Glucose, Bld: 121 mg/dL — ABNORMAL HIGH (ref 65–99)
Potassium: 4.3 mmol/L (ref 3.5–5.3)
Sodium: 140 mmol/L (ref 135–146)
Total Bilirubin: 1.4 mg/dL — ABNORMAL HIGH (ref 0.2–1.2)
Total Protein: 7.2 g/dL (ref 6.1–8.1)
eGFR: 62 mL/min/{1.73_m2} (ref >=60)

## 2022-10-04 LAB — PROTEIN / CREATININE RATIO, URINE
Creatinine, Urine: 23 mg/dL (ref 20–275)
Total Protein, Urine: <4 mg/dL — ABNORMAL LOW (ref 5–24)

## 2022-10-04 LAB — LIPID PANEL
Cholesterol: 235 mg/dL — ABNORMAL HIGH (ref <200)
HDL: 88 mg/dL (ref >=50)
LDL Cholesterol (Calc): 125 mg/dL (calc) — ABNORMAL HIGH
Non-HDL Cholesterol (Calc): 147 mg/dL (calc) — ABNORMAL HIGH (ref <130)
Total CHOL/HDL Ratio: 2.7 (calc) (ref <5.0)
Triglycerides: 116 mg/dL (ref <150)

## 2022-10-04 LAB — HEMOGLOBIN A1C
Hgb A1c MFr Bld: 7.1 % of total Hgb — ABNORMAL HIGH (ref <5.7)
Mean Plasma Glucose: 157 mg/dL
eAG (mmol/L): 8.7 mmol/L

## 2022-10-26 ENCOUNTER — Other Ambulatory Visit: Payer: Self-pay | Admitting: Family Medicine

## 2022-10-28 NOTE — Telephone Encounter (Signed)
Refused the Actos 30 mg refill request because it was discontinued on 07/04/2022.   Refilled the amlodipine 5 mg even though protocol indicates an invalid encounter within 6 months.   Due to a glitch in the system it didn't pick up pt was seen on 10/03/2022 by Dr. Dennard Schaumann.

## 2022-11-05 ENCOUNTER — Ambulatory Visit (INDEPENDENT_AMBULATORY_CARE_PROVIDER_SITE_OTHER): Payer: Medicare PPO | Admitting: Family Medicine

## 2022-11-05 ENCOUNTER — Encounter: Payer: Self-pay | Admitting: Family Medicine

## 2022-11-05 VITALS — BP 124/72 | HR 88 | Ht 65.0 in | Wt 209.0 lb

## 2022-11-05 DIAGNOSIS — E118 Type 2 diabetes mellitus with unspecified complications: Secondary | ICD-10-CM

## 2022-11-05 NOTE — Progress Notes (Signed)
Subjective:    Patient ID: Amanda Riddle, female    DOB: 07-Nov-1934, 88 y.o.   MRN: [id number]  Patient recently had symptomatic hypoglycemia.  She states that she awoke around 3 AM with her heart racing and felt extremely dizzy.  Her blood sugar was 77.  She had a drink something sweet to get her sugar back up.  This had her so concerned that she stopped her insulin.  She is currently on 70/30.  She takes 13 units in the morning and 7 units in the evening.  For the last 8 days, she has been off all insulin.  Her fasting blood sugars are between 100-150.  Her 2-hour postprandial sugars are all less than 160 Past Medical History:  Diagnosis Date   Diabetes mellitus    Diverticulosis    Hypercholesteremia    Hypertension    Past Surgical History:  Procedure Laterality Date   ABDOMINAL HYSTERECTOMY  1986   CATARACT EXTRACTION W/PHACO Right 11/12/2021   Procedure: CATARACT EXTRACTION PHACO AND INTRAOCULAR LENS PLACEMENT (IOC);  Surgeon: Baruch Goldmann, MD;  Location: [facility];  Service: Ophthalmology;  Laterality: Right;  CDE: 11.79   TONSILLECTOMY  1950   Current Outpatient Medications on File Prior to Visit  Medication Sig Dispense Refill   amLODipine (NORVASC) 5 MG tablet TAKE 1 TABLET BY MOUTH DAILY 100 tablet 2   atorvastatin (LIPITOR) 40 MG tablet TAKE 1 TABLET BY MOUTH DAILY 100 tablet 2   Blood Glucose Monitoring Suppl (ACCU-CHEK GUIDE) w/Device KIT USE TO CHECK BLOOD SUGAR UP TO 4 TIMES DAILY. Dx E11.9 1 kit 0   Blood Glucose Monitoring Suppl (TRUE METRIX METER) w/Device KIT      clotrimazole-betamethasone (LOTRISONE) cream APPLY 1 APPLICATION TOPICALLY TWICE A DAY 30 g 0   Cyanocobalamin (VITAMIN B 12 PO) Take 1 tablet by mouth at bedtime.     diclofenac Sodium (VOLTAREN) 1 % GEL Apply 4 g topically 4 (four) times daily. 50 g 1   Flaxseed, Linseed, (FLAXSEED OIL PO) Take 1 capsule by mouth 2 (two) times daily.      furosemide (LASIX) 40 MG tablet TAKE 1 TABLET BY MOUTH EVERY DAY 90  tablet 0   insulin isophane & regular human (NOVOLIN 70/30 FLEXPEN) (70-30) 100 UNIT/ML KwikPen INJECT 30  UNITS SUBCUTANEOUSLY IN THE MORNING AND 15 UNITS SUBCUTANEOUSLY IN THE AFTERNOON (Patient taking differently: INJECT 15 UNITS SUBCUTANEOUSLY IN THE MORNING and 7 at night) 15 mL 11   Insulin Syringe-Needle U-100 (B-D INS SYR ULTRAFINE .3CC/31G) 31G X 5/16" 0.3 ML MISC Use as directed to inject insulin SQ 2x daiy. Dx: E11.65 300 each 3   metFORMIN (GLUCOPHAGE) 1000 MG tablet TAKE 1 TABLET BY MOUTH TWICE  DAILY 160 tablet 3   pioglitazone (ACTOS) 30 MG tablet TAKE 1 TABLET BY MOUTH DAILY 100 tablet 2   potassium chloride (KLOR-CON) 10 MEQ tablet Take 1 tablet (10 mEq total) by mouth daily. 90 tablet 3   TRUE METRIX BLOOD GLUCOSE TEST test strip USE TO CHECK BLOOD SUGARS THREE TIMES DAILY. DX E11.9 300 strip 1   TRUEplus Lancets 30G MISC TEST BLOOD SUGAR THREE TIMES DAILY AS DIRECTED 300 each 1   No current facility-administered medications on file prior to visit.     Allergies  Allergen Reactions   Codeine Itching   Lisinopril Swelling   Social History   Socioeconomic History   Marital status: Divorced    Spouse name: Not on file   Number of children: 2  Years of education: Not on file   Highest education level: Not on file  Occupational History   Not on file  Tobacco Use   Smoking status: Never   Smokeless tobacco: Current    Types: Snuff  Vaping Use   Vaping Use: Never used  Substance and Sexual Activity   Alcohol use: No   Drug use: No   Sexual activity: Not Currently  Other Topics Concern   Not on file  Social History Narrative   Not on file   Social Determinants of Health   Financial Resource Strain: High Risk (03/22/2022)   Overall Financial Resource Strain (CARDIA)    Difficulty of Paying Living Expenses: Hard  Food Insecurity: Food Insecurity Present (03/26/2022)   Hunger Vital Sign    Worried About Running Out of Food in the Last Year: Sometimes true    Ran  Out of Food in the Last Year: Sometimes true  Transportation Needs: No Transportation Needs (03/22/2022)   PRAPARE - Hydrologist (Medical): No    Lack of Transportation (Non-Medical): No  Physical Activity: Inactive (03/22/2022)   Exercise Vital Sign    Days of Exercise per Week: 0 days    Minutes of Exercise per Session: 0 min  Stress: No Stress Concern Present (03/22/2022)   [place]    Feeling of Stress : Only a little  Social Connections: Moderately Isolated (03/22/2022)   Social Connection and Isolation Panel [NHANES]    Frequency of Communication with Friends and Family: More than three times a week    Frequency of Social Gatherings with Friends and Family: Once a week    Attends Religious Services: 1 to 4 times per year    Active Member of [employer] or Organizations: No    Attends Archivist Meetings: Never    Marital Status: Divorced  Human resources officer Violence: Not At Risk (03/22/2022)   Humiliation, Afraid, Rape, and Kick questionnaire    Fear of Current or Ex-Partner: No    Emotionally Abused: No    Physically Abused: No    Sexually Abused: No     Review of Systems  All other systems reviewed and are negative.      Objective:   Physical Exam Vitals reviewed.  Constitutional:      Appearance: She is obese.  Cardiovascular:     Rate and Rhythm: Normal rate and regular rhythm.     Heart sounds: Normal heart sounds. No murmur heard.    No friction rub. No gallop.  Pulmonary:     Effort: Pulmonary effort is normal. No respiratory distress.     Breath sounds: Normal breath sounds. No stridor. No wheezing, rhonchi or rales.  Chest:     Chest wall: No tenderness.  Musculoskeletal:     Right lower leg: No edema.     Left lower leg: No edema.  Neurological:     Mental Status: She is alert.           Assessment & Plan:  Controlled type 2 diabetes mellitus with  complication, without long-term current use of insulin ([place]) Despite stopping her insulin, her most recent sugars are still well-controlled.  I have asked her to monitor her blood sugar daily.  As long as her fasting blood sugars are between 101 150 and her 2-hour postprandial sugars are less than 200, I will not add additional medication.  If her sugars start to rise above that I  would recommend resuming her insulin that it 50% of the dose.  Therefore I would have her take 10 units in the morning only

## 2022-12-10 ENCOUNTER — Other Ambulatory Visit: Payer: Self-pay | Admitting: Family Medicine

## 2022-12-14 ENCOUNTER — Emergency Department (HOSPITAL_BASED_OUTPATIENT_CLINIC_OR_DEPARTMENT_OTHER): Payer: Medicare PPO

## 2022-12-14 ENCOUNTER — Other Ambulatory Visit: Payer: Self-pay

## 2022-12-14 ENCOUNTER — Emergency Department (HOSPITAL_BASED_OUTPATIENT_CLINIC_OR_DEPARTMENT_OTHER)
Admission: EM | Admit: 2022-12-14 | Discharge: 2022-12-14 | Disposition: A | Discharge status: Home / Self Care | Payer: Medicare PPO | Attending: Emergency Medicine | Admitting: Emergency Medicine

## 2022-12-14 DIAGNOSIS — M545 Low back pain, unspecified: Secondary | ICD-10-CM | POA: Diagnosis not present

## 2022-12-14 DIAGNOSIS — W010XXA Fall on same level from slipping, tripping and stumbling without subsequent striking against object, initial encounter: Secondary | ICD-10-CM | POA: Diagnosis not present

## 2022-12-14 DIAGNOSIS — Y9301 Activity, walking, marching and hiking: Secondary | ICD-10-CM | POA: Insufficient documentation

## 2022-12-14 DIAGNOSIS — M25512 Pain in left shoulder: Secondary | ICD-10-CM | POA: Diagnosis not present

## 2022-12-14 DIAGNOSIS — G8911 Acute pain due to trauma: Secondary | ICD-10-CM | POA: Diagnosis not present

## 2022-12-14 DIAGNOSIS — Y92007 Garden or yard of unspecified non-institutional (private) residence as the place of occurrence of the external cause: Secondary | ICD-10-CM | POA: Insufficient documentation

## 2022-12-14 DIAGNOSIS — W19XXXA Unspecified fall, initial encounter: Secondary | ICD-10-CM

## 2022-12-14 NOTE — ED Triage Notes (Signed)
Pt arrives with c/o a fall while she was walking outside. Pt denies LOC or hitting her head. Pt endorse left shoulder and neck pain. Pt does not take blood thinners.

## 2022-12-14 NOTE — ED Provider Notes (Signed)
[facility] Provider Note   CSN: [id number] Arrival date & time: 12/14/22  1624     History  Chief Complaint  Patient presents with   Amanda Riddle    Amanda Riddle is a 88 y.o. female.  88 year old female with complaint of left shoulder pain after fall today. Patient was walking in her yard today when she stepped on a ball in the yard and fell backwards, landing on her buttocks. Reports pain in her buttocks and left posterior shoulder.        Home Medications Prior to Admission medications   Medication Sig Start Date End Date Taking? Authorizing Provider  amLODipine (NORVASC) 5 MG tablet TAKE 1 TABLET BY MOUTH DAILY 10/28/22   Susy Frizzle, MD  atorvastatin (LIPITOR) 40 MG tablet TAKE 1 TABLET BY MOUTH DAILY 04/22/22   Susy Frizzle, MD  Blood Glucose Monitoring Suppl (ACCU-CHEK GUIDE) w/Device KIT USE TO CHECK BLOOD SUGAR UP TO 4 TIMES DAILY. Dx E11.9 03/27/22   Susy Frizzle, MD  Blood Glucose Monitoring Suppl (TRUE METRIX METER) w/Device KIT  01/29/21   [provider]  clotrimazole-betamethasone (LOTRISONE) cream APPLY 1 APPLICATION TOPICALLY TWICE A DAY 09/11/22   Susy Frizzle, MD  Cyanocobalamin (VITAMIN B 12 PO) Take 1 tablet by mouth at bedtime.    [provider]  diclofenac Sodium (VOLTAREN) 1 % GEL Apply 4 g topically 4 (four) times daily. 08/13/21   Eulogio Bear, NP  Flaxseed, Linseed, (FLAXSEED OIL PO) Take 1 capsule by mouth 2 (two) times daily.     [provider]  furosemide (LASIX) 40 MG tablet TAKE 1 TABLET BY MOUTH EVERY DAY 12/10/22   Susy Frizzle, MD  insulin isophane & regular human (NOVOLIN 70/30 FLEXPEN) (70-30) 100 UNIT/ML KwikPen INJECT 30  UNITS SUBCUTANEOUSLY IN THE MORNING AND 15 UNITS SUBCUTANEOUSLY IN THE AFTERNOON Patient taking differently: INJECT 15 UNITS SUBCUTANEOUSLY IN THE MORNING and 7 at night 11/16/20   Susy Frizzle, MD  Insulin Syringe-Needle U-100  (B-D INS SYR ULTRAFINE .3CC/31G) 31G X 5/16" 0.3 ML MISC Use as directed to inject insulin SQ 2x daiy. Dx: E11.65 01/26/21   Eulogio Bear, NP  metFORMIN (GLUCOPHAGE) 1000 MG tablet TAKE 1 TABLET BY MOUTH TWICE  DAILY 04/09/22   Susy Frizzle, MD  pioglitazone (ACTOS) 30 MG tablet TAKE 1 TABLET BY MOUTH DAILY 10/28/22   Susy Frizzle, MD  potassium chloride (KLOR-CON) 10 MEQ tablet TAKE 1 TABLET BY MOUTH EVERY DAY 12/10/22   Susy Frizzle, MD  TRUE METRIX BLOOD GLUCOSE TEST test strip USE TO CHECK BLOOD SUGARS THREE TIMES DAILY. DX E11.9 03/27/22   Susy Frizzle, MD  TRUEplus Lancets 30G MISC TEST BLOOD SUGAR THREE TIMES DAILY AS DIRECTED 03/27/22   Susy Frizzle, MD      Allergies    Codeine and Lisinopril    Review of Systems   Review of Systems Negative except as pt HPI Physical Exam Updated Vital Signs BP (!) 143/70 (BP Location: Right Arm)   Pulse 86   Temp 98.5 F (36.9 C) (Oral)   Resp 16   Ht '5\' 5"'$  (1.651 m)   Wt 95.7 kg   SpO2 97%   BMI 35.11 kg/m  Physical Exam Vitals and nursing note reviewed.  Constitutional:      General: She is not in acute distress.    Appearance: She is well-developed. She is not diaphoretic.  HENT:     Head: Normocephalic and atraumatic.  Pulmonary:     Effort: Pulmonary effort is normal.  Musculoskeletal:     Left shoulder: Bony tenderness present. Decreased range of motion. Normal pulse.       Arms:     Cervical back: No tenderness or bony tenderness.     Thoracic back: No tenderness or bony tenderness.     Lumbar back: No tenderness or bony tenderness.     Comments: Able to log roll hips without pain or difficulty. Normal ROM of hips and knees.   Skin:    General: Skin is warm and dry.     Findings: No erythema or rash.  Neurological:     Mental Status: She is alert and oriented to person, place, and time.  Psychiatric:        Behavior: Behavior normal.     ED Results / Procedures / Treatments   Labs (all labs  ordered are listed, but only abnormal results are displayed) Labs Reviewed - No data to display  EKG None  Radiology DG Pelvis 1-2 Views  Result Date: 12/14/2022 CLINICAL DATA:  Fall while walking outside.  Pelvic pain. EXAM: PELVIS - 1-2 VIEW COMPARISON:  None Available. FINDINGS: There is no evidence of pelvic fracture or diastasis. No pelvic bone lesions are seen. IMPRESSION: Negative. Electronically Signed   By: Marlaine Hind M.D.   On: 12/14/2022 17:30   DG Shoulder Left  Result Date: 12/14/2022 CLINICAL DATA:  Fall while walking outside. Left shoulder injury and pain. EXAM: LEFT SHOULDER - 2+ VIEW COMPARISON:  None Available. FINDINGS: There is no evidence of fracture or dislocation. Mild degenerative spurring is seen along the inferior aspect of the glenohumeral joint, without joint space narrowing. No other osseous abnormality identified. Soft tissues are unremarkable. IMPRESSION: No acute findings. Electronically Signed   By: Marlaine Hind M.D.   On: 12/14/2022 17:29    Procedures Procedures    Medications Ordered in ED Medications - No data to display  ED Course/ Medical Decision Making/ A&P                             Medical Decision Making Amount and/or Complexity of Data Reviewed Radiology: ordered.   88 year old female presents for evaluation after a fall as above.  Did not hit head, no loss of consciousness, not anticoagulated.  No pain through neck or back.  No pain with range of motion lower extremities.  Does have tenderness to the posterior left shoulder, limits range of motion.  Sensation intact, strong radial pulse present.  X-ray of the left shoulder as ordered interpreted myself is negative for fracture, agree with radiologist interpretation.  Recommend Motrin and Tylenol.  Advised to recheck with PCP, may consider physical therapy if not improving.        Final Clinical Impression(s) / ED Diagnoses Final diagnoses:  Fall, initial encounter  Acute pain  of left shoulder    Rx / DC Orders ED Discharge Orders     None         Tacy Learn, PA-C 12/14/22 Miquel Dunn, MD 12/15/22 1300

## 2022-12-14 NOTE — Discharge Instructions (Addendum)
Take Tylenol as needed as directed.  Can apply topical Voltaren gel if needed for pain.  Follow-up with your primary care provider for recheck.  Return to ER for worsening or concerning symptoms.

## 2022-12-30 ENCOUNTER — Other Ambulatory Visit: Payer: Self-pay

## 2022-12-30 MED ORDER — ATORVASTATIN CALCIUM 40 MG PO TABS: 40.0000 mg | ORAL_TABLET | Freq: Every day | ORAL | 0 refills | Status: DC

## 2022-12-31 ENCOUNTER — Telehealth: Payer: Self-pay | Admitting: Family Medicine

## 2022-12-31 ENCOUNTER — Other Ambulatory Visit: Payer: Self-pay

## 2022-12-31 DIAGNOSIS — I10 Essential (primary) hypertension: Secondary | ICD-10-CM

## 2022-12-31 MED ORDER — AMLODIPINE BESYLATE 5 MG PO TABS: 5.0000 mg | ORAL_TABLET | Freq: Every day | ORAL | 2 refills | Status: DC

## 2022-12-31 NOTE — Telephone Encounter (Signed)
Prescription Request  12/31/2022  LOV: 11/05/2022  What is the name of the medication or equipment? amLODipine (NORVASC) 5 MG tablet   Have you contacted your pharmacy to request a refill? Yes   Which pharmacy would you like this sent to?  [facility], [address] [place] [address] Phone: [phone] Fax: [phone]    Patient notified that their request is being sent to the clinical staff for review and that they should receive a response within 2 business days.   Please advise at [place]

## 2023-01-02 ENCOUNTER — Encounter: Payer: Self-pay | Admitting: Family Medicine

## 2023-01-02 ENCOUNTER — Ambulatory Visit (INDEPENDENT_AMBULATORY_CARE_PROVIDER_SITE_OTHER): Payer: Medicare HMO | Admitting: Family Medicine

## 2023-01-02 VITALS — BP 126/68 | HR 72 | Temp 98.6°F | Ht 65.0 in | Wt 207.0 lb

## 2023-01-02 DIAGNOSIS — E118 Type 2 diabetes mellitus with unspecified complications: Secondary | ICD-10-CM

## 2023-01-02 DIAGNOSIS — E782 Mixed hyperlipidemia: Secondary | ICD-10-CM

## 2023-01-02 DIAGNOSIS — I10 Essential (primary) hypertension: Secondary | ICD-10-CM | POA: Diagnosis not present

## 2023-01-02 NOTE — Progress Notes (Signed)
Subjective:    Patient ID: Amanda Riddle, female    DOB: 1935/09/14, 88 y.o.   MRN: [id number]  Patient states that she recently fell.  She was walking around the house and she suddenly went up lying on her back.  She does not remember passing out.  She is not sure how she fell but it sounds like she became dizzy and lost her balance.  I am concerned about hypotension.  She is on Lasix due to leg swelling brought on by pioglitazone.  We started pioglitazone to replace her insulin.  She is currently taking metformin and pioglitazone.  This regimen is keeping her blood sugars between 120-160.  She has not had any further hypoglycemic episode.  She denies any polydipsia or blurry vision but she does report polyuria.  She has frequent urination.  She has a little bit of urge incontinence. Past Medical History:  Diagnosis Date   Diabetes mellitus    Diverticulosis    Hypercholesteremia    Hypertension    Past Surgical History:  Procedure Laterality Date   ABDOMINAL HYSTERECTOMY  1986   CATARACT EXTRACTION W/PHACO Right 11/12/2021   Procedure: CATARACT EXTRACTION PHACO AND INTRAOCULAR LENS PLACEMENT (IOC);  Surgeon: Baruch Goldmann, MD;  Location: [facility];  Service: Ophthalmology;  Laterality: Right;  CDE: 11.79   TONSILLECTOMY  1950   Current Outpatient Medications on File Prior to Visit  Medication Sig Dispense Refill   amLODipine (NORVASC) 5 MG tablet Take 1 tablet (5 mg total) by mouth daily. 100 tablet 2   atorvastatin (LIPITOR) 40 MG tablet Take 1 tablet (40 mg total) by mouth daily. 100 tablet 0   Blood Glucose Monitoring Suppl (ACCU-CHEK GUIDE) w/Device KIT USE TO CHECK BLOOD SUGAR UP TO 4 TIMES DAILY. Dx E11.9 1 kit 0   Blood Glucose Monitoring Suppl (TRUE METRIX METER) w/Device KIT      clotrimazole-betamethasone (LOTRISONE) cream APPLY 1 APPLICATION TOPICALLY TWICE A DAY 30 g 0   Cyanocobalamin (VITAMIN B 12 PO) Take 1 tablet by mouth at bedtime.     diclofenac Sodium (VOLTAREN) 1 %  GEL Apply 4 g topically 4 (four) times daily. 50 g 1   Flaxseed, Linseed, (FLAXSEED OIL PO) Take 1 capsule by mouth 2 (two) times daily.      furosemide (LASIX) 40 MG tablet TAKE 1 TABLET BY MOUTH EVERY DAY 90 tablet 1   insulin isophane & regular human (NOVOLIN 70/30 FLEXPEN) (70-30) 100 UNIT/ML KwikPen INJECT 30  UNITS SUBCUTANEOUSLY IN THE MORNING AND 15 UNITS SUBCUTANEOUSLY IN THE AFTERNOON (Patient taking differently: INJECT 15 UNITS SUBCUTANEOUSLY IN THE MORNING and 7 at night) 15 mL 11   Insulin Syringe-Needle U-100 (B-D INS SYR ULTRAFINE .3CC/31G) 31G X 5/16" 0.3 ML MISC Use as directed to inject insulin SQ 2x daiy. Dx: E11.65 300 each 3   metFORMIN (GLUCOPHAGE) 1000 MG tablet TAKE 1 TABLET BY MOUTH TWICE  DAILY 160 tablet 3   pioglitazone (ACTOS) 30 MG tablet TAKE 1 TABLET BY MOUTH DAILY 100 tablet 2   potassium chloride (KLOR-CON) 10 MEQ tablet TAKE 1 TABLET BY MOUTH EVERY DAY 90 tablet 1   TRUE METRIX BLOOD GLUCOSE TEST test strip USE TO CHECK BLOOD SUGARS THREE TIMES DAILY. DX E11.9 300 strip 1   TRUEplus Lancets 30G MISC TEST BLOOD SUGAR THREE TIMES DAILY AS DIRECTED 300 each 1   No current facility-administered medications on file prior to visit.     Allergies  Allergen Reactions   Codeine Itching  Lisinopril Swelling   Social History   Socioeconomic History   Marital status: Divorced    Spouse name: Not on file   Number of children: 2   Years of education: Not on file   Highest education level: Not on file  Occupational History   Not on file  Tobacco Use   Smoking status: Never   Smokeless tobacco: Current    Types: Snuff  Vaping Use   Vaping Use: Never used  Substance and Sexual Activity   Alcohol use: No   Drug use: No   Sexual activity: Not Currently  Other Topics Concern   Not on file  Social History Narrative   Not on file   Social Determinants of Health   Financial Resource Strain: High Risk (03/22/2022)   Overall Financial Resource Strain (CARDIA)     Difficulty of Paying Living Expenses: Hard  Food Insecurity: Food Insecurity Present (03/26/2022)   Hunger Vital Sign    Worried About Running Out of Food in the Last Year: Sometimes true    Ran Out of Food in the Last Year: Sometimes true  Transportation Needs: No Transportation Needs (03/22/2022)   PRAPARE - Hydrologist (Medical): No    Lack of Transportation (Non-Medical): No  Physical Activity: Inactive (03/22/2022)   Exercise Vital Sign    Days of Exercise per Week: 0 days    Minutes of Exercise per Session: 0 min  Stress: No Stress Concern Present (03/22/2022)   [place]    Feeling of Stress : Only a little  Social Connections: Moderately Isolated (03/22/2022)   Social Connection and Isolation Panel [NHANES]    Frequency of Communication with Friends and Family: More than three times a week    Frequency of Social Gatherings with Friends and Family: Once a week    Attends Religious Services: 1 to 4 times per year    Active Member of [employer] or Organizations: No    Attends Archivist Meetings: Never    Marital Status: Divorced  Human resources officer Violence: Not At Risk (03/22/2022)   Humiliation, Afraid, Rape, and Kick questionnaire    Fear of Current or Ex-Partner: No    Emotionally Abused: No    Physically Abused: No    Sexually Abused: No     Review of Systems  All other systems reviewed and are negative.      Objective:   Physical Exam Vitals reviewed.  Constitutional:      Appearance: She is obese.  Cardiovascular:     Rate and Rhythm: Normal rate and regular rhythm.     Heart sounds: Normal heart sounds. No murmur heard.    No friction rub. No gallop.  Pulmonary:     Effort: Pulmonary effort is normal. No respiratory distress.     Breath sounds: Normal breath sounds. No stridor. No wheezing, rhonchi or rales.  Chest:     Chest wall: No tenderness.   Musculoskeletal:     Right lower leg: No edema.     Left lower leg: No edema.  Neurological:     Mental Status: She is alert.           Assessment & Plan:  Controlled type 2 diabetes mellitus with complication, without long-term current use of insulin ([place]) - Plan: CBC with Differential/Platelet, COMPLETE METABOLIC PANEL WITH GFR, Lipid panel, Hemoglobin A1c, Protein / Creatinine Ratio, Urine  Hypertension, unspecified type  Mixed hyperlipidemia  Her  blood pressure today is excellent.  I am very happy with her reported sugars.  She is only taking metformin and Actos.  She seldom has to take any insulin.  She does occasionally take insulin if her blood sugar is high however her most recent maximum sugar was 160 so therefore she is not taking any of her insulin.  As a result she has had no further hypoglycemic episodes.  I feel that this is the safest for this patient.  The only trade off is pioglitazone is causing leg swelling.  However given her recent fall and dizziness I recommended stopping pioglitazone and potassium to reduce dehydration and hypotension.  If the leg swelling gets worse off of this, we may need to consider switching the pioglitazone to a different alternative however her only other options will be more expensive such as Ozempic.  There is no leg swelling today on exam

## 2023-01-03 LAB — COMPLETE METABOLIC PANEL WITH GFR
AG Ratio: 1.4 (calc) (ref 1.0–2.5)
ALT: 9 U/L (ref 6–29)
AST: 12 U/L (ref 10–35)
Albumin: 3.9 g/dL (ref 3.6–5.1)
Alkaline phosphatase (APISO): 77 U/L (ref 37–153)
BUN: 10 mg/dL (ref 7–25)
CO2: 27 mmol/L (ref 20–32)
Calcium: 9.6 mg/dL (ref 8.6–10.4)
Chloride: 104 mmol/L (ref 98–110)
Creat: 0.94 mg/dL (ref 0.60–0.95)
Globulin: 2.7 g/dL (calc) (ref 1.9–3.7)
Glucose, Bld: 161 mg/dL — ABNORMAL HIGH (ref 65–99)
Potassium: 4.1 mmol/L (ref 3.5–5.3)
Sodium: 143 mmol/L (ref 135–146)
Total Bilirubin: 1.2 mg/dL (ref 0.2–1.2)
Total Protein: 6.6 g/dL (ref 6.1–8.1)
eGFR: 59 mL/min/{1.73_m2} — ABNORMAL LOW (ref >=60)

## 2023-01-03 LAB — CBC WITH DIFFERENTIAL/PLATELET
Absolute Monocytes: 341 cells/uL (ref 200–950)
Basophils Absolute: 38 cells/uL (ref 0–200)
Basophils Relative: 0.8 %
Eosinophils Absolute: 62 cells/uL (ref 15–500)
Eosinophils Relative: 1.3 %
HCT: 36.9 % (ref 35.0–45.0)
Hemoglobin: 12.3 g/dL (ref 11.7–15.5)
Lymphs Abs: 2314 cells/uL (ref 850–3900)
MCH: 29.9 pg (ref 27.0–33.0)
MCHC: 33.3 g/dL (ref 32.0–36.0)
MCV: 89.8 fL (ref 80.0–100.0)
MPV: 9.9 fL (ref 7.5–12.5)
Monocytes Relative: 7.1 %
Neutro Abs: 2045 cells/uL (ref 1500–7800)
Neutrophils Relative %: 42.6 %
Platelets: 266 10*3/uL (ref 140–400)
RBC: 4.11 10*6/uL (ref 3.80–5.10)
RDW: 12.5 % (ref 11.0–15.0)
Total Lymphocyte: 48.2 %
WBC: 4.8 10*3/uL (ref 3.8–10.8)

## 2023-01-03 LAB — PROTEIN / CREATININE RATIO, URINE
Creatinine, Urine: 26 mg/dL (ref 20–275)
Total Protein, Urine: <4 mg/dL — ABNORMAL LOW (ref 5–24)

## 2023-01-03 LAB — LIPID PANEL
Cholesterol: 186 mg/dL (ref <200)
HDL: 70 mg/dL (ref >=50)
LDL Cholesterol (Calc): 91 mg/dL (calc)
Non-HDL Cholesterol (Calc): 116 mg/dL (calc) (ref <130)
Total CHOL/HDL Ratio: 2.7 (calc) (ref <5.0)
Triglycerides: 156 mg/dL — ABNORMAL HIGH (ref <150)

## 2023-01-03 LAB — HEMOGLOBIN A1C
Hgb A1c MFr Bld: 7.8 % of total Hgb — ABNORMAL HIGH (ref <5.7)
Mean Plasma Glucose: 177 mg/dL
eAG (mmol/L): 9.8 mmol/L

## 2023-01-07 ENCOUNTER — Telehealth: Payer: Self-pay | Admitting: Family Medicine

## 2023-01-07 ENCOUNTER — Other Ambulatory Visit: Payer: Self-pay

## 2023-01-07 DIAGNOSIS — E782 Mixed hyperlipidemia: Secondary | ICD-10-CM

## 2023-01-07 DIAGNOSIS — I10 Essential (primary) hypertension: Secondary | ICD-10-CM

## 2023-01-07 DIAGNOSIS — E118 Type 2 diabetes mellitus with unspecified complications: Secondary | ICD-10-CM

## 2023-01-07 DIAGNOSIS — I509 Heart failure, unspecified: Secondary | ICD-10-CM

## 2023-01-07 MED ORDER — AMLODIPINE BESYLATE 5 MG PO TABS: 5.0000 mg | ORAL_TABLET | Freq: Every day | ORAL | 2 refills | Status: DC

## 2023-01-07 MED ORDER — METFORMIN HCL 1000 MG PO TABS: ORAL_TABLET | ORAL | 3 refills | Status: DC

## 2023-01-07 MED ORDER — FUROSEMIDE 40 MG PO TABS: 40.0000 mg | ORAL_TABLET | Freq: Every day | ORAL | 1 refills | Status: DC

## 2023-01-07 MED ORDER — ATORVASTATIN CALCIUM 40 MG PO TABS: 40.0000 mg | ORAL_TABLET | Freq: Every day | ORAL | 1 refills | Status: DC

## 2023-01-07 MED ORDER — POTASSIUM CHLORIDE ER 10 MEQ PO TBCR: 10.0000 meq | EXTENDED_RELEASE_TABLET | Freq: Every day | ORAL | 1 refills | Status: DC

## 2023-01-07 NOTE — Telephone Encounter (Signed)
Prescription Request  01/07/2023  LOV: 01/02/2023  What is the name of the medication or equipment?   furosemide (LASIX) 40 MG tablet   potassium chloride (KLOR-CON) 10 MEQ tablet [id number]   metFORMIN (GLUCOPHAGE) 1000 MG tablet [id number]   amLODipine (NORVASC) 5 MG tablet [id number]   atorvastatin (LIPITOR) 40 MG tablet [id number]  **PHARMACY REQUESTING NEW SCRIPTS FOR ALL MEDS LISTED ABOVE**  Have you contacted your pharmacy to request a refill? Yes   Which pharmacy would you like this sent to?  [place], [address] [place] [address] Phone: [phone] Fax: [phone]    Patient notified that their request is being sent to the clinical staff for review and that they should receive a response within 2 business days.   Please advise pharmacist.

## 2023-01-16 ENCOUNTER — Other Ambulatory Visit: Payer: Medicare HMO

## 2023-01-16 DIAGNOSIS — Z794 Long term (current) use of insulin: Secondary | ICD-10-CM | POA: Diagnosis not present

## 2023-01-16 DIAGNOSIS — E119 Type 2 diabetes mellitus without complications: Secondary | ICD-10-CM

## 2023-01-17 LAB — COMPREHENSIVE METABOLIC PANEL
AG Ratio: 1.6 (calc) (ref 1.0–2.5)
ALT: 8 U/L (ref 6–29)
AST: 12 U/L (ref 10–35)
Albumin: 3.9 g/dL (ref 3.6–5.1)
Alkaline phosphatase (APISO): 71 U/L (ref 37–153)
BUN: 8 mg/dL (ref 7–25)
CO2: 21 mmol/L (ref 20–32)
Calcium: 9.1 mg/dL (ref 8.6–10.4)
Chloride: 107 mmol/L (ref 98–110)
Creat: 0.82 mg/dL (ref 0.60–0.95)
Globulin: 2.5 g/dL (calc) (ref 1.9–3.7)
Glucose, Bld: 155 mg/dL — ABNORMAL HIGH (ref 65–99)
Potassium: 4.1 mmol/L (ref 3.5–5.3)
Sodium: 142 mmol/L (ref 135–146)
Total Bilirubin: 0.8 mg/dL (ref 0.2–1.2)
Total Protein: 6.4 g/dL (ref 6.1–8.1)

## 2023-03-06 ENCOUNTER — Other Ambulatory Visit: Payer: Self-pay | Admitting: Family Medicine

## 2023-03-06 NOTE — Telephone Encounter (Signed)
Requested Prescriptions  Pending Prescriptions Disp Refills   ACCU-CHEK GUIDE test strip [Pharmacy Med Name: ACCU-CHEK GUIDE TEST STRIP] 300 strip 1    Sig: USE TO CHECK BLOOD SUGARS THREE TIMES DAILY. DX E11.9     There is no refill protocol information for this order

## 2023-03-13 ENCOUNTER — Telehealth: Payer: Self-pay

## 2023-03-13 ENCOUNTER — Other Ambulatory Visit: Payer: Self-pay | Admitting: Family Medicine

## 2023-03-13 MED ORDER — BENZONATATE 200 MG PO CAPS: 200.0000 mg | ORAL_CAPSULE | Freq: Three times a day (TID) | ORAL | 0 refills | Status: DC | PRN

## 2023-03-13 NOTE — Telephone Encounter (Signed)
Pt called in to ask if pcp would send in something for a cough and legs aching. Please advise  Cb#: [phone]

## 2023-03-30 ENCOUNTER — Other Ambulatory Visit: Payer: Self-pay | Admitting: Family Medicine

## 2023-03-30 DIAGNOSIS — E782 Mixed hyperlipidemia: Secondary | ICD-10-CM

## 2023-03-31 NOTE — Telephone Encounter (Signed)
Unable to refill per protocol, Rx request is too soon. Last refill 01/07/23 for 100 days and 1 refill.  Requested Prescriptions  Pending Prescriptions Disp Refills   atorvastatin (LIPITOR) 40 MG tablet [Pharmacy Med Name: ATORVASTATIN 40 MG TABLET] 90 tablet 1    Sig: TAKE 1 TABLET BY MOUTH EVERY DAY     Cardiovascular:  Antilipid - Statins Failed - 03/30/2023  8:43 AM      Failed - Valid encounter within last 12 months    Recent Outpatient Visits           1 year ago Controlled type 2 diabetes mellitus with complication, without long-term current use of insulin (HCC)   [facility] Pickard, Priscille Heidelberg, MD   1 year ago Leg swelling   [facility] Pickard, Priscille Heidelberg, MD   1 year ago Leg swelling   [facility] Tanya Nones, Priscille Heidelberg, MD   1 year ago Leg swelling   [facility] Tanya Nones, Priscille Heidelberg, MD   1 year ago Acute pain of right knee   [facility] Cathlean Marseilles A, NP              Failed - Lipid Panel in normal range within the last 12 months    Cholesterol  Date Value Ref Range Status  01/02/2023 186 <200 mg/dL Final   LDL Cholesterol (Calc)  Date Value Ref Range Status  01/02/2023 91 mg/dL (calc) Final    Comment:    Reference range: <100 . Desirable range <100 mg/dL for primary prevention;   <70 mg/dL for patients with CHD or diabetic patients  with > or = 2 CHD risk factors. Marland Kitchen LDL-C is now calculated using the Martin-Hopkins  calculation, which is a validated novel method providing  better accuracy than the Friedewald equation in the  estimation of LDL-C.  Horald Pollen et al. Lenox Ahr. 1610;960(45): 2061-2068  (http://education.[facility].com/faq/FAQ164)    HDL  Date Value Ref Range Status  01/02/2023 70 > OR = 50 mg/dL Final   Triglycerides  Date Value Ref Range Status  01/02/2023 156 (H) <150 mg/dL Final         Passed - Patient is not pregnant

## 2023-04-04 ENCOUNTER — Encounter: Payer: Self-pay | Admitting: Family Medicine

## 2023-04-04 ENCOUNTER — Ambulatory Visit (INDEPENDENT_AMBULATORY_CARE_PROVIDER_SITE_OTHER): Payer: Medicare HMO | Admitting: Family Medicine

## 2023-04-04 ENCOUNTER — Other Ambulatory Visit: Payer: Self-pay | Admitting: Family Medicine

## 2023-04-04 VITALS — BP 120/72 | HR 71 | Temp 98.3°F | Ht 65.0 in | Wt 202.6 lb

## 2023-04-04 DIAGNOSIS — E118 Type 2 diabetes mellitus with unspecified complications: Secondary | ICD-10-CM | POA: Diagnosis not present

## 2023-04-04 DIAGNOSIS — Z794 Long term (current) use of insulin: Secondary | ICD-10-CM | POA: Diagnosis not present

## 2023-04-04 MED ORDER — INSULIN GLARGINE 100 UNITS/ML SOLOSTAR PEN: 10.0000 [IU] | PEN_INJECTOR | Freq: Every day | SUBCUTANEOUS | 11 refills | Status: DC

## 2023-04-04 NOTE — Progress Notes (Signed)
Subjective:    Patient ID: Amanda Riddle, female    DOB: 04/18/1935, 88 y.o.   MRN: [id number]  Last HgA1c in March was 7.1.  Patient is currently doing 70/30 insulin once a day.  It is very difficult to ascertain exactly how she is doing this but after several questions, I believe she is taking 10 units once a day regardless.  However if recurs, she will to 20 units.  Her sugars are remarkably well-controlled.  The vast majority are between 101 160.  She has had a recent hypoglycemic episode of 49 Past Medical History:  Diagnosis Date   Diabetes mellitus    Diverticulosis    Hypercholesteremia    Hypertension    Past Surgical History:  Procedure Laterality Date   ABDOMINAL HYSTERECTOMY  1986   CATARACT EXTRACTION W/PHACO Right 11/12/2021   Procedure: CATARACT EXTRACTION PHACO AND INTRAOCULAR LENS PLACEMENT (IOC);  Surgeon: Fabio Pierce, MD;  Location: [facility];  Service: Ophthalmology;  Laterality: Right;  CDE: 11.79   TONSILLECTOMY  1950   Current Outpatient Medications on File Prior to Visit  Medication Sig Dispense Refill   ACCU-CHEK GUIDE test strip USE TO CHECK BLOOD SUGARS THREE TIMES DAILY. DX E11.9 300 strip 1   amLODipine (NORVASC) 5 MG tablet Take 1 tablet (5 mg total) by mouth daily. 100 tablet 2   atorvastatin (LIPITOR) 40 MG tablet Take 1 tablet (40 mg total) by mouth daily. 100 tablet 1   benzonatate (TESSALON) 200 MG capsule Take 1 capsule (200 mg total) by mouth 3 (three) times daily as needed for cough. 20 capsule 0   Blood Glucose Monitoring Suppl (ACCU-CHEK GUIDE) w/Device KIT USE TO CHECK BLOOD SUGAR UP TO 4 TIMES DAILY. Dx E11.9 1 kit 0   Blood Glucose Monitoring Suppl (TRUE METRIX METER) w/Device KIT      clotrimazole-betamethasone (LOTRISONE) cream APPLY 1 APPLICATION TOPICALLY TWICE A DAY 30 g 0   Cyanocobalamin (VITAMIN B 12 PO) Take 1 tablet by mouth at bedtime.     diclofenac Sodium (VOLTAREN) 1 % GEL Apply 4 g topically 4 (four) times daily. 50 g 1    Flaxseed, Linseed, (FLAXSEED OIL PO) Take 1 capsule by mouth 2 (two) times daily.      furosemide (LASIX) 40 MG tablet Take 1 tablet (40 mg total) by mouth daily. 90 tablet 1   Insulin Syringe-Needle U-100 (B-D INS SYR ULTRAFINE .3CC/31G) 31G X 5/16" 0.3 ML MISC Use as directed to inject insulin SQ 2x daiy. Dx: E11.65 300 each 3   metFORMIN (GLUCOPHAGE) 1000 MG tablet TAKE 1 TABLET BY MOUTH TWICE  DAILY 160 tablet 3   pioglitazone (ACTOS) 30 MG tablet TAKE 1 TABLET BY MOUTH DAILY 100 tablet 2   potassium chloride (KLOR-CON) 10 MEQ tablet Take 1 tablet (10 mEq total) by mouth daily. 90 tablet 1   TRUEplus Lancets 30G MISC TEST BLOOD SUGAR THREE TIMES DAILY AS DIRECTED 300 each 1   insulin isophane & regular human (NOVOLIN 70/30 FLEXPEN) (70-30) 100 UNIT/ML KwikPen INJECT 30  UNITS SUBCUTANEOUSLY IN THE MORNING AND 15 UNITS SUBCUTANEOUSLY IN THE AFTERNOON (Patient not taking: Reported on 04/04/2023) 15 mL 11   No current facility-administered medications on file prior to visit.     Allergies  Allergen Reactions   Codeine Itching   Lisinopril Swelling   Social History   Socioeconomic History   Marital status: Divorced    Spouse name: Not on file   Number of children: 2  Years of education: Not on file   Highest education level: Not on file  Occupational History   Not on file  Tobacco Use   Smoking status: Never   Smokeless tobacco: Current    Types: Snuff  Vaping Use   Vaping Use: Never used  Substance and Sexual Activity   Alcohol use: No   Drug use: No   Sexual activity: Not Currently  Other Topics Concern   Not on file  Social History Narrative   Not on file   Social Determinants of Health   Financial Resource Strain: High Risk (03/22/2022)   Overall Financial Resource Strain (CARDIA)    Difficulty of Paying Living Expenses: Hard  Food Insecurity: Food Insecurity Present (03/26/2022)   Hunger Vital Sign    Worried About Running Out of Food in the Last Year: Sometimes  true    Ran Out of Food in the Last Year: Sometimes true  Transportation Needs: No Transportation Needs (03/22/2022)   PRAPARE - Administrator, Civil Service (Medical): No    Lack of Transportation (Non-Medical): No  Physical Activity: Inactive (03/22/2022)   Exercise Vital Sign    Days of Exercise per Week: 0 days    Minutes of Exercise per Session: 0 min  Stress: No Stress Concern Present (03/22/2022)   [employer] of Occupational Health - Occupational Stress Questionnaire    Feeling of Stress : Only a little  Social Connections: Moderately Isolated (03/22/2022)   Social Connection and Isolation Panel [NHANES]    Frequency of Communication with Friends and Family: More than three times a week    Frequency of Social Gatherings with Friends and Family: Once a week    Attends Religious Services: 1 to 4 times per year    Active Member of [employer] or Organizations: No    Attends Banker Meetings: Never    Marital Status: Divorced  Catering manager Violence: Not At Risk (03/22/2022)   Humiliation, Afraid, Rape, and Kick questionnaire    Fear of Current or Ex-Partner: No    Emotionally Abused: No    Physically Abused: No    Sexually Abused: No     Review of Systems  All other systems reviewed and are negative.      Objective:   Physical Exam Vitals reviewed.  Constitutional:      Appearance: She is obese.  Cardiovascular:     Rate and Rhythm: Normal rate and regular rhythm.     Heart sounds: Normal heart sounds. No murmur heard.    No friction rub. No gallop.  Pulmonary:     Effort: Pulmonary effort is normal. No respiratory distress.     Breath sounds: Normal breath sounds. No stridor. No wheezing, rhonchi or rales.  Chest:     Chest wall: No tenderness.  Musculoskeletal:     Right lower leg: No edema.     Left lower leg: No edema.  Neurological:     Mental Status: She is alert.           Assessment & Plan:  Controlled type 2 diabetes  mellitus with complication, without long-term current use of insulin (HCC) - Plan: CBC with Differential/Platelet, Lipid panel, Hemoglobin A1c, COMPLETE METABOLIC PANEL WITH GFR Discontinue 70/30 insulin due to the possible risk of hypoglycemia from rapid acting insulin.  I would much rather her be on a longer basal acting insulin with a longer steady state.  Try Lantus 10 units once a day continue metformin and Actos.  Recommend against giving meals which she currently doing.

## 2023-04-04 NOTE — Telephone Encounter (Signed)
Requested medication (s) are due for refill today: alternative requested  Requested medication (s) are on the active medication list: yes  Last refill:  04/04/23  Future visit scheduled: no  Notes to clinic:  Pharmacy comment: Alternative Requested:THE PRESCRIBED MEDICATION IS NOT COVERED BY INSURANCE. PLEASE CONSIDER CHANGING TO ONE OF THE SUGGESTED COVERED ALTERNATIVES      Requested Prescriptions  Pending Prescriptions Disp Refills   LANTUS 100 UNIT/ML injection [Pharmacy Med Name: LANTUS 100 UNIT/ML VIAL] 1 mL 0     Endocrinology:  Diabetes - Insulins Failed - 04/04/2023 11:32 AM      Failed - Valid encounter within last 6 months    Recent Outpatient Visits           1 year ago Controlled type 2 diabetes mellitus with complication, without long-term current use of insulin (HCC)   [facility] Pickard, Priscille Heidelberg, MD   1 year ago Leg swelling   [facility] Donita Brooks, MD   1 year ago Leg swelling   [facility] Tanya Nones, Priscille Heidelberg, MD   1 year ago Leg swelling   [facility] Donita Brooks, MD   1 year ago Acute pain of right knee   [facility] Valentino Nose, NP              Passed - HBA1C is between 0 and 7.9 and within 180 days    Hgb A1c MFr Bld  Date Value Ref Range Status  01/02/2023 7.8 (H) <5.7 % of total Hgb Final    Comment:    For someone without known diabetes, a hemoglobin A1c value of 6.5% or greater indicates that they may have  diabetes and this should be confirmed with a follow-up  test. . For someone with known diabetes, a value <7% indicates  that their diabetes is well controlled and a value  greater than or equal to 7% indicates suboptimal  control. A1c targets should be individualized based on  duration of diabetes, age, comorbid conditions, and  other considerations. . Currently, no consensus exists regarding use of hemoglobin A1c for diagnosis  of diabetes for children. Marland Kitchen

## 2023-04-05 LAB — COMPLETE METABOLIC PANEL WITH GFR
AG Ratio: 1.8 (calc) (ref 1.0–2.5)
ALT: 10 U/L (ref 6–29)
AST: 14 U/L (ref 10–35)
Albumin: 4.1 g/dL (ref 3.6–5.1)
Alkaline phosphatase (APISO): 78 U/L (ref 37–153)
BUN: 10 mg/dL (ref 7–25)
CO2: 23 mmol/L (ref 20–32)
Calcium: 9.4 mg/dL (ref 8.6–10.4)
Chloride: 106 mmol/L (ref 98–110)
Creat: 0.95 mg/dL (ref 0.60–0.95)
Globulin: 2.3 g/dL (calc) (ref 1.9–3.7)
Glucose, Bld: 134 mg/dL — ABNORMAL HIGH (ref 65–99)
Potassium: 4.2 mmol/L (ref 3.5–5.3)
Sodium: 142 mmol/L (ref 135–146)
Total Bilirubin: 1.3 mg/dL — ABNORMAL HIGH (ref 0.2–1.2)
Total Protein: 6.4 g/dL (ref 6.1–8.1)
eGFR: 59 mL/min/{1.73_m2} — ABNORMAL LOW (ref >=60)

## 2023-04-05 LAB — CBC WITH DIFFERENTIAL/PLATELET
Absolute Monocytes: 281 cells/uL (ref 200–950)
Basophils Absolute: 21 cells/uL (ref 0–200)
Basophils Relative: 0.5 %
Eosinophils Absolute: 42 cells/uL (ref 15–500)
Eosinophils Relative: 1 %
HCT: 39.4 % (ref 35.0–45.0)
Hemoglobin: 12.7 g/dL (ref 11.7–15.5)
Lymphs Abs: 1932 cells/uL (ref 850–3900)
MCH: 29.4 pg (ref 27.0–33.0)
MCHC: 32.2 g/dL (ref 32.0–36.0)
MCV: 91.2 fL (ref 80.0–100.0)
MPV: 9.9 fL (ref 7.5–12.5)
Monocytes Relative: 6.7 %
Neutro Abs: 1924 cells/uL (ref 1500–7800)
Neutrophils Relative %: 45.8 %
Platelets: 230 10*3/uL (ref 140–400)
RBC: 4.32 10*6/uL (ref 3.80–5.10)
RDW: 12.7 % (ref 11.0–15.0)
Total Lymphocyte: 46 %
WBC: 4.2 10*3/uL (ref 3.8–10.8)

## 2023-04-05 LAB — HEMOGLOBIN A1C
Hgb A1c MFr Bld: 7.8 % of total Hgb — ABNORMAL HIGH (ref <5.7)
Mean Plasma Glucose: 177 mg/dL
eAG (mmol/L): 9.8 mmol/L

## 2023-04-05 LAB — LIPID PANEL
Cholesterol: 172 mg/dL (ref <200)
HDL: 68 mg/dL (ref >=50)
LDL Cholesterol (Calc): 82 mg/dL (calc)
Non-HDL Cholesterol (Calc): 104 mg/dL (calc) (ref <130)
Total CHOL/HDL Ratio: 2.5 (calc) (ref <5.0)
Triglycerides: 123 mg/dL (ref <150)

## 2023-04-09 NOTE — Progress Notes (Deleted)
Subjective:   Amanda Riddle is a 88 y.o. female who presents for Medicare Annual (Subsequent) preventive examination.  Visit Complete: {VISITMETHOD:[phone]}  Patient Medicare AWV questionnaire was completed by the patient on ***; I have confirmed that all information answered by patient is correct and no changes since this date.  Review of Systems    ***       Objective:    There were no vitals filed for this visit. There is no height or weight on file to calculate BMI.     12/14/2022    4:31 PM 03/22/2022    9:08 AM 11/07/2021    3:33 PM 03/09/2021   10:05 AM 03/28/2016    3:22 PM 01/17/2012    3:06 AM  Advanced Directives  Does Patient Have a Medical Advance Directive? No No No No No Patient does not have advance directive  Would patient like information on creating a medical advance directive? No - Patient declined No - Patient declined No - Patient declined No - Patient declined No - patient declined information   Pre-existing out of facility DNR order (yellow form or pink MOST form)      No    Current Medications (verified) Outpatient Encounter Medications as of 04/10/2023  Medication Sig   ACCU-CHEK GUIDE test strip USE TO CHECK BLOOD SUGARS THREE TIMES DAILY. DX E11.9   amLODipine (NORVASC) 5 MG tablet Take 1 tablet (5 mg total) by mouth daily.   atorvastatin (LIPITOR) 40 MG tablet Take 1 tablet (40 mg total) by mouth daily.   benzonatate (TESSALON) 200 MG capsule Take 1 capsule (200 mg total) by mouth 3 (three) times daily as needed for cough.   Blood Glucose Monitoring Suppl (ACCU-CHEK GUIDE) w/Device KIT USE TO CHECK BLOOD SUGAR UP TO 4 TIMES DAILY. Dx E11.9   Blood Glucose Monitoring Suppl (TRUE METRIX METER) w/Device KIT    clotrimazole-betamethasone (LOTRISONE) cream APPLY 1 APPLICATION TOPICALLY TWICE A DAY   Cyanocobalamin (VITAMIN B 12 PO) Take 1 tablet by mouth at bedtime.   diclofenac Sodium (VOLTAREN) 1 % GEL Apply 4 g topically 4 (four) times daily.    Flaxseed, Linseed, (FLAXSEED OIL PO) Take 1 capsule by mouth 2 (two) times daily.    furosemide (LASIX) 40 MG tablet Take 1 tablet (40 mg total) by mouth daily.   insulin glargine (LANTUS) 100 unit/mL SOPN Inject 10 Units into the skin daily.   Insulin Syringe-Needle U-100 (B-D INS SYR ULTRAFINE .3CC/31G) 31G X 5/16" 0.3 ML MISC Use as directed to inject insulin SQ 2x daiy. Dx: E11.65   metFORMIN (GLUCOPHAGE) 1000 MG tablet TAKE 1 TABLET BY MOUTH TWICE  DAILY   pioglitazone (ACTOS) 30 MG tablet TAKE 1 TABLET BY MOUTH DAILY   potassium chloride (KLOR-CON) 10 MEQ tablet Take 1 tablet (10 mEq total) by mouth daily.   TRUEplus Lancets 30G MISC TEST BLOOD SUGAR THREE TIMES DAILY AS DIRECTED   No facility-administered encounter medications on file as of 04/10/2023.    Allergies (verified) Codeine and Lisinopril   History: Past Medical History:  Diagnosis Date   Diabetes mellitus    Diverticulosis    Hypercholesteremia    Hypertension    Past Surgical History:  Procedure Laterality Date   ABDOMINAL HYSTERECTOMY  1986   CATARACT EXTRACTION W/PHACO Right 11/12/2021   Procedure: CATARACT EXTRACTION PHACO AND INTRAOCULAR LENS PLACEMENT (IOC);  Surgeon: Fabio Pierce, MD;  Location: [facility];  Service: Ophthalmology;  Laterality: Right;  CDE: 11.79   TONSILLECTOMY  1950   Family History  Problem Relation Age of Onset   Colon cancer Neg Hx    Breast cancer Neg Hx    Social History   Socioeconomic History   Marital status: Divorced    Spouse name: Not on file   Number of children: 2   Years of education: Not on file   Highest education level: Not on file  Occupational History   Not on file  Tobacco Use   Smoking status: Never   Smokeless tobacco: Current    Types: Snuff  Vaping Use   Vaping Use: Never used  Substance and Sexual Activity   Alcohol use: No   Drug use: No   Sexual activity: Not Currently  Other Topics Concern   Not on file  Social History Narrative   Not on file    Social Determinants of Health   Financial Resource Strain: High Risk (03/22/2022)   Overall Financial Resource Strain (CARDIA)    Difficulty of Paying Living Expenses: Hard  Food Insecurity: Food Insecurity Present (03/26/2022)   Hunger Vital Sign    Worried About Running Out of Food in the Last Year: Sometimes true    Ran Out of Food in the Last Year: Sometimes true  Transportation Needs: No Transportation Needs (03/22/2022)   PRAPARE - Administrator, Civil Service (Medical): No    Lack of Transportation (Non-Medical): No  Physical Activity: Inactive (03/22/2022)   Exercise Vital Sign    Days of Exercise per Week: 0 days    Minutes of Exercise per Session: 0 min  Stress: No Stress Concern Present (03/22/2022)   [employer] of Occupational Health - Occupational Stress Questionnaire    Feeling of Stress : Only a little  Social Connections: Moderately Isolated (03/22/2022)   Social Connection and Isolation Panel [NHANES]    Frequency of Communication with Friends and Family: More than three times a week    Frequency of Social Gatherings with Friends and Family: Once a week    Attends Religious Services: 1 to 4 times per year    Active Member of [employer] or Organizations: No    Attends Banker Meetings: Never    Marital Status: Divorced    Tobacco Counseling Ready to quit: Not Answered Counseling given: Not Answered   Clinical Intake:                        Activities of Daily Living     No data to display          Patient Care Team: Donita Brooks, MD as PCP - General (Family Medicine) Erroll Luna, [facility] (Inactive) as Pharmacist (Pharmacist)  Indicate any recent Medical Services you may have received from other than [facility] providers in the past year (date may be approximate).     Assessment:   This is a routine wellness examination for Na.  Hearing/Vision screen No results found.  Dietary issues and exercise activities  discussed:     Goals Addressed   None    Depression Screen    01/02/2023   10:09 AM 10/03/2022   10:18 AM 07/04/2022    8:55 AM 03/22/2022    8:57 AM 03/09/2021   10:07 AM 10/25/2020    9:29 AM 11/30/2019    8:53 AM  PHQ 2/9 Scores  PHQ - 2 Score 0 0 0 0 3 0 0  PHQ- 9 Score     11  Fall Risk    01/02/2023   10:09 AM 10/03/2022   10:18 AM 07/04/2022    8:55 AM 03/22/2022    9:09 AM 08/13/2021    2:04 PM  Fall Risk   Falls in the past year? 1 0 0 1 1  Number falls in past yr: 0 0 0 0 0  Injury with Fall? 1 0 0 1 1  Comment     right knee  Risk for fall due to : History of fall(s);Impaired balance/gait;Orthopedic patient No Fall Risks Impaired balance/gait Impaired balance/gait;History of fall(s) Impaired mobility  Follow up Education provided;Falls prevention discussed Falls prevention discussed Falls prevention discussed Falls prevention discussed Falls evaluation completed    MEDICARE RISK AT HOME:   TIMED UP AND GO:  Was the test performed?  {[phone]}    Cognitive Function:        03/22/2022    9:14 AM 03/09/2021   10:22 AM  6CIT Screen  What Year? 0 points 0 points  What month? 0 points 0 points  What time? 0 points 0 points  Count back from 20 0 points 0 points  Months in reverse 2 points 0 points  Repeat phrase 0 points 2 points  Total Score 2 points 2 points    Immunizations Immunization History  Administered Date(s) Administered   COVID-19, mRNA, vaccine(Comirnaty)12 years and older 07/05/2022   Fluad Quad(high Dose 65+) 07/23/2019, 08/17/2020, 09/25/2021   Influenza, High Dose Seasonal PF 08/15/2017, 08/14/2018   Influenza,inj,Quad PF,6+ Mos 07/05/2013, 07/05/2014, 08/10/2015, 08/26/2016, 07/04/2022   Influenza-Unspecified 07/19/2022   PFIZER(Purple Top)SARS-COV-2 Vaccination 04/14/2020, 05/05/2020   Pfizer Covid-19 Vaccine Bivalent Booster 49yrs & up 10/15/2021   Pneumococcal Conjugate-13 11/02/2013   Pneumococcal Polysaccharide-23  05/09/2015   Zoster Recombinat (Shingrix) 02/05/2022    TDAP status: Due, Education has been provided regarding the importance of this vaccine. Advised may receive this vaccine at local pharmacy or Health Dept. Aware to provide a copy of the vaccination record if obtained from local pharmacy or Health Dept. Verbalized acceptance and understanding.  Pneumococcal vaccine status: Up to date  Covid-19 vaccine status: Information provided on how to obtain vaccines.   Qualifies for Shingles Vaccine? Yes   Zostavax completed  No    Shingrix Completed?: No.    Education has been provided regarding the importance of this vaccine. Patient has been advised to call insurance company to determine out of pocket expense if they have not yet received this vaccine. Advised may also receive vaccine at local pharmacy or Health Dept. Verbalized acceptance and understanding.  Screening Tests Health Maintenance  Topic Date Due   Zoster Vaccines- Shingrix (2 of 2) 04/02/2022   Medicare Annual Wellness (AWV)  03/23/2023   INFLUENZA VACCINE  05/22/2023   OPHTHALMOLOGY EXAM  07/03/2023   HEMOGLOBIN A1C  10/04/2023   Diabetic kidney evaluation - Urine ACR  01/02/2024   Diabetic kidney evaluation - eGFR measurement  04/03/2024   FOOT EXAM  04/03/2024   Pneumonia Vaccine 61+ Years old  Completed   DEXA SCAN  Completed   COVID-19 Vaccine  Completed   HPV VACCINES  Aged Out   DTaP/Tdap/Td  Discontinued   Colonoscopy  Discontinued    Health Maintenance  Health Maintenance Due  Topic Date Due   Zoster Vaccines- Shingrix (2 of 2) 04/02/2022   Medicare Annual Wellness (AWV)  03/23/2023    Colorectal cancer screening: No longer required.   Mammogram status: No longer required due to age and preference.  Bone Density status:  Completed 10/30/10. Results reflect: Bone density results: OSTEOPENIA. Repeat every 2 years.  Lung Cancer Screening: (Low Dose CT Chest recommended if Age 38-80 years, 20 pack-year  currently smoking OR have quit w/in 15years.) does not qualify.   Lung Cancer Screening Referral: n/a  Additional Screening:  Hepatitis C Screening: does not qualify  Vision Screening: Recommended annual ophthalmology exams for early detection of glaucoma and other disorders of the eye. Is the patient up to date with their annual eye exam?  Yes  Who is the provider or what is the name of the office in which the patient attends annual eye exams? [facility]  If pt is not established with a provider, would they like to be referred to a provider to establish care? No .   Dental Screening: Recommended annual dental exams for proper oral hygiene  Diabetic Foot Exam: Diabetic Foot Exam: Completed 04/04/23  Community Resource Referral / Chronic Care Management: CRR required this visit?  {YES/NO:21197}  CCM required this visit?  {CCM Required choices:[phone]}     Plan:     I have personally reviewed and noted the following in the patient's chart:   Medical and social history Use of alcohol, tobacco or illicit drugs  Current medications and supplements including opioid prescriptions. {Opioid Prescriptions:[phone]} Functional ability and status Nutritional status Physical activity Advanced directives List of other physicians Hospitalizations, surgeries, and ER visits in previous 12 months Vitals Screenings to include cognitive, depression, and falls Referrals and appointments  In addition, I have reviewed and discussed with patient certain preventive protocols, quality metrics, and best practice recommendations. A written personalized care plan for preventive services as well as general preventive health recommendations were provided to patient.     Kandis Fantasia [place], [place]   1/61/0960   After Visit Summary: {CHL AMB AWV After Visit Summary:[phone]}  Nurse Notes: ***

## 2023-04-10 ENCOUNTER — Telehealth: Payer: Self-pay

## 2023-04-10 NOTE — Telephone Encounter (Signed)
Pt called in wanting to speak with nurse. Pt has a question about her insulin and a new med that pcp prescribed for her. Please advise.  Cb#: [phone]

## 2023-05-02 ENCOUNTER — Other Ambulatory Visit: Payer: Self-pay

## 2023-05-02 ENCOUNTER — Telehealth: Payer: Self-pay | Admitting: Family Medicine

## 2023-05-02 DIAGNOSIS — E119 Type 2 diabetes mellitus without complications: Secondary | ICD-10-CM

## 2023-05-02 MED ORDER — TRUEPLUS LANCETS 30G MISC: 1 refills | Status: DC

## 2023-05-02 MED ORDER — TRUE METRIX LEVEL 1 LOW VI SOLN: 12 refills | Status: AC

## 2023-05-02 MED ORDER — ALCOHOL SWABS PADS: MEDICATED_PAD | 0 refills | Status: DC

## 2023-05-02 MED ORDER — TRUE METRIX METER W/DEVICE KIT: PACK | 1 refills | Status: DC

## 2023-05-02 MED ORDER — TRUE METRIX BLOOD GLUCOSE TEST VI STRP: ORAL_STRIP | 12 refills | Status: DC

## 2023-05-02 NOTE — Telephone Encounter (Signed)
Prescription Request  05/02/2023  LOV: 04/04/2023  What is the name of the medication or equipment?   TRUEPLUS 33G LANCETS  TRUE METRIX AIR GLUCOSE METER  HUMANA TRUE METRIX TEST STRIP  TRUE METRIX LEVEL 1 CTRL SOLN  BD SOMG;E ISE SWAB  Have you contacted your pharmacy to request a refill? Yes   Which pharmacy would you like this sent to?  [facility] - [place], [place] - [address] [place] [address] Phone: [phone] Fax: [phone]    Patient notified that their request is being sent to the clinical staff for review and that they should receive a response within 2 business days.   Please advise pharmacist.

## 2023-05-15 ENCOUNTER — Other Ambulatory Visit: Payer: Self-pay | Admitting: Family Medicine

## 2023-05-15 DIAGNOSIS — E782 Mixed hyperlipidemia: Secondary | ICD-10-CM

## 2023-08-13 ENCOUNTER — Other Ambulatory Visit: Payer: Self-pay | Admitting: Family Medicine

## 2023-08-13 DIAGNOSIS — I10 Essential (primary) hypertension: Secondary | ICD-10-CM

## 2023-08-13 DIAGNOSIS — I509 Heart failure, unspecified: Secondary | ICD-10-CM

## 2023-08-14 ENCOUNTER — Ambulatory Visit: Payer: Medicare HMO

## 2023-08-14 ENCOUNTER — Other Ambulatory Visit: Payer: Self-pay | Admitting: Family Medicine

## 2023-08-14 VITALS — Ht 65.0 in | Wt 202.0 lb

## 2023-08-14 DIAGNOSIS — E119 Type 2 diabetes mellitus without complications: Secondary | ICD-10-CM

## 2023-08-14 DIAGNOSIS — Z0000 Encounter for general adult medical examination without abnormal findings: Secondary | ICD-10-CM

## 2023-08-14 DIAGNOSIS — Z794 Long term (current) use of insulin: Secondary | ICD-10-CM

## 2023-08-14 NOTE — Patient Instructions (Signed)
Amanda Riddle , Thank you for taking time to come for your Medicare Wellness Visit. I appreciate your ongoing commitment to your health goals. Please review the following plan we discussed and let me know if I can assist you in the future.   Referrals/Orders/Follow-Ups/Clinician Recommendations: Aim for 30 minutes of exercise or brisk walking, 6-8 glasses of water, and 5 servings of fruits and vegetables each day.  This is a list of the screening recommended for you and due dates:  Health Maintenance  Topic Date Due   Zoster (Shingles) Vaccine (2 of 2) 04/02/2022   Flu Shot  05/22/2023   COVID-19 Vaccine (5 - 2023-24 season) 06/22/2023   Eye exam for diabetics  07/03/2023   Hemoglobin A1C  10/04/2023   Complete foot exam   04/03/2024   Medicare Annual Wellness Visit  08/13/2024   Pneumonia Vaccine  Completed   DEXA scan (bone density measurement)  Completed   HPV Vaccine  Aged Out   DTaP/Tdap/Td vaccine  Discontinued   Colon Cancer Screening  Discontinued    Advanced directives: (ACP Link)Information on Advanced Care Planning can be found at [facility] [place] Advance Health Care Directives Advance Health Care Directives ([email or web address]   Next Medicare Annual Wellness Visit scheduled for next year: Yes

## 2023-08-14 NOTE — Progress Notes (Signed)
Subjective:   Amanda Riddle is a 88 y.o. female who presents for Medicare Annual (Subsequent) preventive examination.  Visit Complete: Virtual I connected with  Amanda Riddle on 08/14/23 by a audio enabled telemedicine application and verified that I am speaking with the correct person using two identifiers.  Patient Location: Home  Provider Location: Home Office  I discussed the limitations of evaluation and management by telemedicine. The patient expressed understanding and agreed to proceed.  Vital Signs: Because this visit was a virtual/telehealth visit, some criteria may be missing or patient reported. Any vitals not documented were not able to be obtained and vitals that have been documented are patient reported.  Cardiac Risk Factors include: advanced age (>73men, >4 women);diabetes mellitus;dyslipidemia;hypertension;sedentary lifestyle     Objective:    Today's Vitals   08/14/23 1432  Weight: 202 lb (91.6 kg)  Height: 5\' 5"  (1.651 m)   Body mass index is 33.61 kg/m.     08/14/2023    5:46 PM 12/14/2022    4:31 PM 03/22/2022    9:08 AM 11/07/2021    3:33 PM 03/09/2021   10:05 AM 03/28/2016    3:22 PM 01/17/2012    3:06 AM  Advanced Directives  Does Patient Have a Medical Advance Directive? No No No No No No Patient does not have advance directive  Would patient like information on creating a medical advance directive? Yes (MAU/Ambulatory/Procedural Areas - Information given) No - Patient declined No - Patient declined No - Patient declined No - Patient declined No - patient declined information   Pre-existing out of facility DNR order (yellow form or pink MOST form)       No    Current Medications (verified) Outpatient Encounter Medications as of 08/14/2023  Medication Sig   Alcohol Swabs PADS USE TO CLEAN SKIN PRIOR TO CHECKING BLOOD GLUCOSE   amLODipine (NORVASC) 5 MG tablet Take 1 tablet (5 mg total) by mouth daily.   atorvastatin (LIPITOR) 40 MG tablet TAKE 1  TABLET BY MOUTH EVERY DAY   benzonatate (TESSALON) 200 MG capsule Take 1 capsule (200 mg total) by mouth 3 (three) times daily as needed for cough.   Blood Glucose Calibration (TRUE METRIX LEVEL 1) Low SOLN USE AS DIRECTED TO CHECK METER   Blood Glucose Monitoring Suppl (TRUE METRIX METER) w/Device KIT Use to check blood sugar up to 3 times per day.   clotrimazole-betamethasone (LOTRISONE) cream APPLY 1 APPLICATION TOPICALLY TWICE A DAY   Cyanocobalamin (VITAMIN B 12 PO) Take 1 tablet by mouth at bedtime.   diclofenac Sodium (VOLTAREN) 1 % GEL Apply 4 g topically 4 (four) times daily.   Flaxseed, Linseed, (FLAXSEED OIL PO) Take 1 capsule by mouth 2 (two) times daily.    furosemide (LASIX) 40 MG tablet TAKE 1 TABLET BY MOUTH EVERY DAY   glucose blood (TRUE METRIX BLOOD GLUCOSE TEST) test strip Use as instructed   insulin glargine (LANTUS) 100 unit/mL SOPN Inject 10 Units into the skin daily.   Insulin Syringe-Needle U-100 (B-D INS SYR ULTRAFINE .3CC/31G) 31G X 5/16" 0.3 ML MISC Use as directed to inject insulin SQ 2x daiy. Dx: E11.65   metFORMIN (GLUCOPHAGE) 1000 MG tablet TAKE 1 TABLET BY MOUTH TWICE  DAILY   pioglitazone (ACTOS) 30 MG tablet TAKE 1 TABLET BY MOUTH DAILY   potassium chloride (KLOR-CON) 10 MEQ tablet TAKE 1 TABLET BY MOUTH EVERY DAY   TRUEplus Lancets 30G MISC TEST BLOOD SUGAR THREE TIMES DAILY AS DIRECTED  No facility-administered encounter medications on file as of 08/14/2023.    Allergies (verified) Codeine and Lisinopril   History: Past Medical History:  Diagnosis Date   Diabetes mellitus    Diverticulosis    Hypercholesteremia    Hypertension    Past Surgical History:  Procedure Laterality Date   ABDOMINAL HYSTERECTOMY  1986   CATARACT EXTRACTION W/PHACO Right 11/12/2021   Procedure: CATARACT EXTRACTION PHACO AND INTRAOCULAR LENS PLACEMENT (IOC);  Surgeon: Fabio Pierce, MD;  Location: [facility];  Service: Ophthalmology;  Laterality: Right;  CDE: 11.79    TONSILLECTOMY  1950   Family History  Problem Relation Age of Onset   Colon cancer Neg Hx    Breast cancer Neg Hx    Social History   Socioeconomic History   Marital status: Divorced    Spouse name: Not on file   Number of children: 2   Years of education: Not on file   Highest education level: Not on file  Occupational History   Not on file  Tobacco Use   Smoking status: Never   Smokeless tobacco: Current    Types: Snuff  Vaping Use   Vaping status: Never Used  Substance and Sexual Activity   Alcohol use: No   Drug use: No   Sexual activity: Not Currently  Other Topics Concern   Not on file  Social History Narrative   Not on file   Social Determinants of Health   Financial Resource Strain: Low Risk  (08/14/2023)   Overall Financial Resource Strain (CARDIA)    Difficulty of Paying Living Expenses: Not very hard  Food Insecurity: Food Insecurity Present (08/14/2023)   Hunger Vital Sign    Worried About Running Out of Food in the Last Year: Sometimes true    Ran Out of Food in the Last Year: Sometimes true  Transportation Needs: No Transportation Needs (08/14/2023)   PRAPARE - Administrator, Civil Service (Medical): No    Lack of Transportation (Non-Medical): No  Physical Activity: Inactive (08/14/2023)   Exercise Vital Sign    Days of Exercise per Week: 0 days    Minutes of Exercise per Session: 0 min  Stress: No Stress Concern Present (08/14/2023)   [employer] of Occupational Health - Occupational Stress Questionnaire    Feeling of Stress : Not at all  Social Connections: Moderately Isolated (08/14/2023)   Social Connection and Isolation Panel [NHANES]    Frequency of Communication with Friends and Family: More than three times a week    Frequency of Social Gatherings with Friends and Family: Twice a week    Attends Religious Services: 1 to 4 times per year    Active Member of [employer] or Organizations: No    Attends Hospital doctor: Never    Marital Status: Divorced    Tobacco Counseling Ready to quit: Not Answered Counseling given: Not Answered   Clinical Intake:  Pre-visit preparation completed: Yes  Pain : No/denies pain     Diabetes: Yes CBG done?: No Did pt. bring in CBG monitor from home?: No  How often do you need to have someone help you when you read instructions, pamphlets, or other written materials from your doctor or pharmacy?: 1 - Never  Interpreter Needed?: No  Information entered by :: Kandis Fantasia LPN   Activities of Daily Living    08/14/2023    5:46 PM  In your present state of health, do you have any difficulty performing the following activities:  Hearing? 0  Vision? 0  Difficulty concentrating or making decisions? 0  Walking or climbing stairs? 0  Dressing or bathing? 0  Doing errands, shopping? 0  Preparing Food and eating ? N  Using the Toilet? N  In the past six months, have you accidently leaked urine? N  Do you have problems with loss of bowel control? N  Managing your Medications? N  Managing your Finances? N  Housekeeping or managing your Housekeeping? N    Patient Care Team: Donita Brooks, MD as PCP - General (Family Medicine) Erroll Luna, [facility] (Inactive) as Pharmacist (Pharmacist)  Indicate any recent Medical Services you may have received from other than [facility] providers in the past year (date may be approximate).     Assessment:   This is a routine wellness examination for Amanda Riddle.  Hearing/Vision screen Hearing Screening - Comments:: Denies hearing difficulties   Vision Screening - Comments:: No vision problems; will schedule routine eye exam soon     Goals Addressed             This Visit's Progress    COMPLETED: Monitor and Manage My Blood Sugar-Diabetes Type 2       Timeframe:  Long-Range Goal Priority:  High Start Date:  04/30/21                           Expected End Date: 11/11/20                      Follow Up  Date 07/20/21    - check blood sugar at prescribed times - check blood sugar if I feel it is too high or too low - take the blood sugar log to all doctor visits    Why is this important?   Checking your blood sugar at home helps to keep it from getting very high or very low.  Writing the results in a diary or log helps the doctor know how to care for you.  Your blood sugar log should have the time, date and the results.  Also, write down the amount of insulin or other medicine that you take.  Other information, like what you ate, exercise done and how you were feeling, will also be helpful.     Notes:       Depression Screen    08/14/2023    5:43 PM 01/02/2023   10:09 AM 10/03/2022   10:18 AM 07/04/2022    8:55 AM 03/22/2022    8:57 AM 03/09/2021   10:07 AM 10/25/2020    9:29 AM  PHQ 2/9 Scores  PHQ - 2 Score 0 0 0 0 0 3 0  PHQ- 9 Score      11     Fall Risk    08/14/2023    2:41 PM 01/02/2023   10:09 AM 10/03/2022   10:18 AM 07/04/2022    8:55 AM 03/22/2022    9:09 AM  Fall Risk   Falls in the past year? 0 1 0 0 1  Number falls in past yr: 0 0 0 0 0  Injury with Fall? 0 1 0 0 1  Risk for fall due to : No Fall Risks History of fall(s);Impaired balance/gait;Orthopedic patient No Fall Risks Impaired balance/gait Impaired balance/gait;History of fall(s)  Follow up Falls prevention discussed;Education provided;Falls evaluation completed Education provided;Falls prevention discussed Falls prevention discussed Falls prevention discussed Falls prevention discussed    MEDICARE RISK  AT HOME: Medicare Risk at Home Any stairs in or around the home?: No If so, are there any without handrails?: No Home free of loose throw rugs in walkways, pet beds, electrical cords, etc?: Yes Adequate lighting in your home to reduce risk of falls?: Yes Life alert?: No Use of a cane, walker or w/c?: No Grab bars in the bathroom?: Yes Shower chair or bench in shower?: No Elevated toilet seat or a  handicapped toilet?: Yes  TIMED UP AND GO:  Was the test performed?  No    Cognitive Function:        08/14/2023    5:46 PM 03/22/2022    9:14 AM 03/09/2021   10:22 AM  6CIT Screen  What Year? 0 points 0 points 0 points  What month? 0 points 0 points 0 points  What time? 0 points 0 points 0 points  Count back from 20 0 points 0 points 0 points  Months in reverse 2 points 2 points 0 points  Repeat phrase 0 points 0 points 2 points  Total Score 2 points 2 points 2 points    Immunizations Immunization History  Administered Date(s) Administered   Fluad Quad(high Dose 65+) 07/23/2019, 08/17/2020, 09/25/2021   Influenza, High Dose Seasonal PF 08/15/2017, 08/14/2018   Influenza,inj,Quad PF,6+ Mos 07/05/2013, 07/05/2014, 08/10/2015, 08/26/2016, 07/04/2022   Influenza-Unspecified 07/19/2022   PFIZER(Purple Top)SARS-COV-2 Vaccination 04/14/2020, 05/05/2020   Pfizer Covid-19 Vaccine Bivalent Booster 25yrs & up 10/15/2021   Pfizer(Comirnaty)Fall Seasonal Vaccine 12 years and older 07/05/2022   Pneumococcal Conjugate-13 11/02/2013   Pneumococcal Polysaccharide-23 05/09/2015   Zoster Recombinant(Shingrix) 02/05/2022    TDAP status: Due, Education has been provided regarding the importance of this vaccine. Advised may receive this vaccine at local pharmacy or Health Dept. Aware to provide a copy of the vaccination record if obtained from local pharmacy or Health Dept. Verbalized acceptance and understanding.  Flu Vaccine status: Due, Education has been provided regarding the importance of this vaccine. Advised may receive this vaccine at local pharmacy or Health Dept. Aware to provide a copy of the vaccination record if obtained from local pharmacy or Health Dept. Verbalized acceptance and understanding.  Pneumococcal vaccine status: Up to date  Covid-19 vaccine status: Information provided on how to obtain vaccines.   Qualifies for Shingles Vaccine? Yes   Zostavax completed No    Shingrix Completed?: No.    Education has been provided regarding the importance of this vaccine. Patient has been advised to call insurance company to determine out of pocket expense if they have not yet received this vaccine. Advised may also receive vaccine at local pharmacy or Health Dept. Verbalized acceptance and understanding.  Screening Tests Health Maintenance  Topic Date Due   Zoster Vaccines- Shingrix (2 of 2) 04/02/2022   INFLUENZA VACCINE  05/22/2023   COVID-19 Vaccine (5 - 2023-24 season) 06/22/2023   OPHTHALMOLOGY EXAM  07/03/2023   HEMOGLOBIN A1C  10/04/2023   FOOT EXAM  04/03/2024   Medicare Annual Wellness (AWV)  08/13/2024   Pneumonia Vaccine 45+ Years old  Completed   DEXA SCAN  Completed   HPV VACCINES  Aged Out   DTaP/Tdap/Td  Discontinued   Colonoscopy  Discontinued    Health Maintenance  Health Maintenance Due  Topic Date Due   Zoster Vaccines- Shingrix (2 of 2) 04/02/2022   INFLUENZA VACCINE  05/22/2023   COVID-19 Vaccine (5 - 2023-24 season) 06/22/2023   OPHTHALMOLOGY EXAM  07/03/2023    Colorectal cancer screening: No longer required.  Mammogram status: No longer required due to age and preference.  Bone Density status: Completed 11/21/10. Results reflect: Bone density results: OSTEOPENIA. Repeat every 2 years.  Lung Cancer Screening: (Low Dose CT Chest recommended if Age 70-80 years, 20 pack-year currently smoking OR have quit w/in 15years.) does not qualify.   Lung Cancer Screening Referral: n/a  Additional Screening:  Hepatitis C Screening: does not qualify  Vision Screening: Recommended annual ophthalmology exams for early detection of glaucoma and other disorders of the eye. Is the patient up to date with their annual eye exam?  No  Who is the provider or what is the name of the office in which the patient attends annual eye exams? none If pt is not established with a provider, would they like to be referred to a provider to establish  care? No .   Dental Screening: Recommended annual dental exams for proper oral hygiene  Diabetic Foot Exam: Diabetic Foot Exam: Overdue, Pt has been advised about the importance in completing this exam. Pt is scheduled for diabetic foot exam on at next office visit.  Community Resource Referral / Chronic Care Management: CRR required this visit?  No   CCM required this visit?  No     Plan:     I have personally reviewed and noted the following in the patient's chart:   Medical and social history Use of alcohol, tobacco or illicit drugs  Current medications and supplements including opioid prescriptions. Patient is not currently taking opioid prescriptions. Functional ability and status Nutritional status Physical activity Advanced directives List of other physicians Hospitalizations, surgeries, and ER visits in previous 12 months Vitals Screenings to include cognitive, depression, and falls Referrals and appointments  In addition, I have reviewed and discussed with patient certain preventive protocols, quality metrics, and best practice recommendations. A written personalized care plan for preventive services as well as general preventive health recommendations were provided to patient.     Kandis Fantasia [place], [place]   [id number]   After Visit Summary: (Mail) Due to this being a telephonic visit, the after visit summary with patients personalized plan was offered to patient via mail   Nurse Notes: No concerns at this time

## 2023-08-14 NOTE — Telephone Encounter (Signed)
Requested Prescriptions  Pending Prescriptions Disp Refills   furosemide (LASIX) 40 MG tablet [Pharmacy Med Name: FUROSEMIDE 40 MG TABLET] 90 tablet 0    Sig: TAKE 1 TABLET BY MOUTH EVERY DAY     Cardiovascular:  Diuretics - Loop Failed - 08/13/2023  1:26 AM      Failed - Mg Level in normal range and within 180 days    Magnesium  Date Value Ref Range Status  01/17/2012 1.5 1.5 - 2.5 mg/dL Final         Failed - Valid encounter within last 6 months    Recent Outpatient Visits           1 year ago Controlled type 2 diabetes mellitus with complication, without long-term current use of insulin (HCC)   [facility] Pickard, Priscille Heidelberg, MD   1 year ago Leg swelling   [facility] Tanya Nones, Priscille Heidelberg, MD   1 year ago Leg swelling   [facility] Tanya Nones, Priscille Heidelberg, MD   1 year ago Leg swelling   [facility] Donita Brooks, MD   2 years ago Acute pain of right knee   [facility] Valentino Nose, NP              Passed - K in normal range and within 180 days    Potassium  Date Value Ref Range Status  04/04/2023 4.2 3.5 - 5.3 mmol/L Final         Passed - Ca in normal range and within 180 days    Calcium  Date Value Ref Range Status  04/04/2023 9.4 8.6 - 10.4 mg/dL Final         Passed - Na in normal range and within 180 days    Sodium  Date Value Ref Range Status  04/04/2023 142 135 - 146 mmol/L Final         Passed - Cr in normal range and within 180 days    Creat  Date Value Ref Range Status  04/04/2023 0.95 0.60 - 0.95 mg/dL Final   Creatinine, Urine  Date Value Ref Range Status  01/02/2023 26 20 - 275 mg/dL Final         Passed - Cl in normal range and within 180 days    Chloride  Date Value Ref Range Status  04/04/2023 106 98 - 110 mmol/L Final         Passed - Last BP in normal range    BP Readings from Last 1 Encounters:  04/04/23 120/72          potassium chloride  (KLOR-CON) 10 MEQ tablet [Pharmacy Med Name: POTASSIUM CL ER 10 MEQ TABLET] 90 tablet 0    Sig: TAKE 1 TABLET BY MOUTH EVERY DAY     Endocrinology:  Minerals - Potassium Supplementation Failed - 08/13/2023  1:26 AM      Failed - Valid encounter within last 12 months    Recent Outpatient Visits           1 year ago Controlled type 2 diabetes mellitus with complication, without long-term current use of insulin (HCC)   [facility] Pickard, Priscille Heidelberg, MD   1 year ago Leg swelling   [facility] Donita Brooks, MD   1 year ago Leg swelling   [facility] Donita Brooks, MD   1 year ago Leg swelling   [facility] Donita Brooks, MD   2 years ago Acute pain of right knee   [facility] Valentino Nose, NP              Passed - K in normal range and within 360 days    Potassium  Date Value Ref Range Status  04/04/2023 4.2 3.5 - 5.3 mmol/L Final         Passed - Cr in normal range and within 360 days    Creat  Date Value Ref Range Status  04/04/2023 0.95 0.60 - 0.95 mg/dL Final   Creatinine, Urine  Date Value Ref Range Status  01/02/2023 26 20 - 275 mg/dL Final

## 2023-08-15 NOTE — Telephone Encounter (Signed)
Requested medications are due for refill today.  Prep pads yes. Pt recently got a new meter.  Requested medications are on the active medications list.  yes  Last refill. 05/02/2023  Future visit scheduled.   yes  Notes to clinic.  Prep pad have no protocol assigned. Pt recently got a new meter. Please advise.    Requested Prescriptions  Pending Prescriptions Disp Refills   Alcohol Swabs (DROPSAFE ALCOHOL PREP) 70 % PADS [Pharmacy Med Name: DropSafe Alcohol Prep Pad 70 %] 100 each 0    Sig: USE TO CLEAN SKIN PRIOR TO CHECKING BLOOD GLUCOSE     Off-Protocol Failed - 08/14/2023 12:59 PM      Failed - Medication not assigned to a protocol, review manually.      Failed - Valid encounter within last 12 months    Recent Outpatient Visits           1 year ago Controlled type 2 diabetes mellitus with complication, without long-term current use of insulin (HCC)   [facility] Pickard, Priscille Heidelberg, MD   1 year ago Leg swelling   [facility] Pickard, Priscille Heidelberg, MD   1 year ago Leg swelling   [facility] Tanya Nones, Priscille Heidelberg, MD   1 year ago Leg swelling   [facility] Donita Brooks, MD   2 years ago Acute pain of right knee   [facility] Valentino Nose, NP               Blood Glucose Monitoring Suppl (TRUE METRIX AIR GLUCOSE METER) w/Device KIT [Pharmacy Med Name: True Metrix Air Glucose Meter Kit w/Device] 1 kit 3    Sig: USE AS DIRECTED     Endocrinology: Diabetes - Testing Supplies Failed - 08/14/2023 12:59 PM      Failed - Valid encounter within last 12 months    Recent Outpatient Visits           1 year ago Controlled type 2 diabetes mellitus with complication, without long-term current use of insulin (HCC)   [facility] Pickard, Priscille Heidelberg, MD   1 year ago Leg swelling   [facility] Tanya Nones, Priscille Heidelberg, MD   1 year ago Leg swelling   [facility] Tanya Nones, Priscille Heidelberg, MD   1 year ago Leg swelling   [facility] Donita Brooks, MD   2 years ago Acute pain of right knee   [facility] Valentino Nose, NP

## 2023-08-25 ENCOUNTER — Other Ambulatory Visit: Payer: Self-pay | Admitting: Family Medicine

## 2023-08-25 DIAGNOSIS — E118 Type 2 diabetes mellitus with unspecified complications: Secondary | ICD-10-CM

## 2023-08-26 NOTE — Telephone Encounter (Signed)
Requested Prescriptions  Pending Prescriptions Disp Refills   metFORMIN (GLUCOPHAGE) 1000 MG tablet [Pharmacy Med Name: metFORMIN HCl Oral Tablet 1000 MG] 180 tablet 1    Sig: TAKE 1 TABLET TWICE DAILY     Endocrinology:  Diabetes - Biguanides Failed - 08/25/2023 10:27 AM      Failed - eGFR in normal range and within 360 days    GFR, Est African American  Date Value Ref Range Status  02/08/2021 58 (L) > OR = 60 mL/min/1.50m2 Final   GFR, Est Non African American  Date Value Ref Range Status  02/08/2021 50 (L) > OR = 60 mL/min/1.85m2 Final   GFR, Estimated  Date Value Ref Range Status  06/27/2021 52 (L) >60 mL/min Final    Comment:    (NOTE) Calculated using the CKD-EPI Creatinine Equation (2021)    eGFR  Date Value Ref Range Status  04/04/2023 59 (L) > OR = 60 mL/min/1.37m2 Final         Failed - B12 Level in normal range and within 720 days    No results found for: "VITAMINB12"       Failed - Valid encounter within last 6 months    Recent Outpatient Visits           1 year ago Controlled type 2 diabetes mellitus with complication, without long-term current use of insulin (HCC)   [facility] Pickard, Priscille Heidelberg, MD   1 year ago Leg swelling   [facility] Tanya Nones, Priscille Heidelberg, MD   1 year ago Leg swelling   [facility] Tanya Nones, Priscille Heidelberg, MD   1 year ago Leg swelling   [facility] Donita Brooks, MD   2 years ago Acute pain of right knee   [facility] Valentino Nose, NP              Passed - Cr in normal range and within 360 days    Creat  Date Value Ref Range Status  04/04/2023 0.95 0.60 - 0.95 mg/dL Final   Creatinine, Urine  Date Value Ref Range Status  01/02/2023 26 20 - 275 mg/dL Final         Passed - HBA1C is between 0 and 7.9 and within 180 days    Hgb A1c MFr Bld  Date Value Ref Range Status  04/04/2023 7.8 (H) <5.7 % of total Hgb Final    Comment:    For  someone without known diabetes, a hemoglobin A1c value of 6.5% or greater indicates that they may have  diabetes and this should be confirmed with a follow-up  test. . For someone with known diabetes, a value <7% indicates  that their diabetes is well controlled and a value  greater than or equal to 7% indicates suboptimal  control. A1c targets should be individualized based on  duration of diabetes, age, comorbid conditions, and  other considerations. . Currently, no consensus exists regarding use of hemoglobin A1c for diagnosis of diabetes for children. .          Passed - CBC within normal limits and completed in the last 12 months    WBC  Date Value Ref Range Status  04/04/2023 4.2 3.8 - 10.8 Thousand/uL Final   RBC  Date Value Ref Range Status  04/04/2023 4.32 3.80 - 5.10 Million/uL Final   Hemoglobin  Date Value Ref Range Status  04/04/2023 12.7 11.7 - 15.5 g/dL Final   HCT  Date Value Ref Range Status  04/04/2023 39.4 35.0 - 45.0 % Final   MCHC  Date Value Ref Range Status  04/04/2023 32.2 32.0 - 36.0 g/dL Final   [facility]  Date Value Ref Range Status  04/04/2023 29.4 27.0 - 33.0 pg Final   MCV  Date Value Ref Range Status  04/04/2023 91.2 80.0 - 100.0 fL Final   No results found for: "PLTCOUNTKUC", "LABPLAT", "POCPLA" RDW  Date Value Ref Range Status  04/04/2023 12.7 11.0 - 15.0 % Final

## 2023-09-02 ENCOUNTER — Telehealth: Payer: Self-pay

## 2023-09-02 DIAGNOSIS — E119 Type 2 diabetes mellitus without complications: Secondary | ICD-10-CM

## 2023-09-02 MED ORDER — INSULIN GLARGINE 100 UNITS/ML SOLOSTAR PEN: 10.0000 [IU] | PEN_INJECTOR | Freq: Every day | SUBCUTANEOUS | 11 refills | Status: DC

## 2023-09-02 NOTE — Telephone Encounter (Signed)
Copied from CRM [id number]. Topic: Clinical - Medication Refill >> Sep 02, 2023 12:18 PM Larwance Sachs wrote: Most Recent Primary Care Visit:  Provider: Anthoney Harada  Department: [facility]  Visit Type: MEDICARE AWV, SEQUENTIAL  Date: 08/14/2023  Medication: insulin glargine (LANTUS) 100 unit/mL SOPN   Has the patient contacted their pharmacy? Yes (Agent: If no, request that the patient contact the pharmacy for the refill. If patient does not wish to contact the pharmacy document the reason why and proceed with request.) (Agent: If yes, when and what did the pharmacy advise?) Stated they would call her back   Is this the correct pharmacy for this prescription? Yes If no, delete pharmacy and type the correct one.  This is the patient's preferred pharmacy:    [id number] [place], [place] - [address] [place] [address] Phone: [phone] Fax: [phone]   Has the prescription been filled recently? No  Is the patient out of the medication? No, patient is almost out and will need urgently   Has the patient been seen for an appointment in the last year OR does the patient have an upcoming appointment? No  Can we respond through MyChart? No  Patient request Germaine Pomfret call back at [phone]  Agent: Please be advised that Rx refills may take up to 3 business days. We ask that you follow-up with your pharmacy.

## 2023-09-04 ENCOUNTER — Telehealth: Payer: Self-pay

## 2023-09-04 ENCOUNTER — Ambulatory Visit: Payer: Self-pay | Admitting: Family Medicine

## 2023-09-04 ENCOUNTER — Other Ambulatory Visit: Payer: Self-pay

## 2023-09-04 DIAGNOSIS — E119 Type 2 diabetes mellitus without complications: Secondary | ICD-10-CM

## 2023-09-04 MED ORDER — INSULIN GLARGINE 100 UNITS/ML SOLOSTAR PEN: 10.0000 [IU] | PEN_INJECTOR | Freq: Every day | SUBCUTANEOUS | 11 refills | Status: DC

## 2023-09-04 NOTE — Telephone Encounter (Signed)
  Chief Complaint: Medication Refill- Insulin Lantus, Patient states they have used their last dose and cannot get in contact with anyone.  Symptoms: No symptoms at this time.  Frequency: Acute Pertinent Negatives: Patient denies hyperglycemic symptoms.  Disposition: [] ED /[] Urgent Care (no appt availability in office) / [] Appointment(In office/virtual)/ []  [facility]/ [] Home Care/ [] Refused Recommended Disposition /[] [facility]/ [x]  Follow-up with PCP Additional Notes: Patient states her blood glucose levels are normally in the 120's. They are now in the 150's-160's. She used her last dose of Insulin Glargine this morning and was instructed to not mix the 70/30 she has possession of. Patient appointment established, due to not being seen since July.    Reason for Disposition  [1] Prescription refill request for ESSENTIAL medicine (i.e., likelihood of harm to patient if not taken) AND [2] triager unable to refill per department policy  Answer Assessment - Initial Assessment Questions 1. DRUG NAME: "What medicine do you need to have refilled?"     Insulin Glargine 10 Units Every Morning 2. REFILLS REMAINING: "How many refills are remaining?" (Note: The label on the medicine or pill bottle will show how many refills are remaining. If there are no refills remaining, then a renewal may be needed.)     No answer provided.  3. EXPIRATION DATE: "What is the expiration date?" (Note: The label states when the prescription will expire, and thus can no longer be refilled.)     Last dose used by patient, needing a new supply.  4. PRESCRIBING HCP: "Who prescribed it?" Reason: If prescribed by specialist, call should be referred to that group.     Lynnea Ferrier, MD 5. SYMPTOMS: "Do you have any symptoms?"     No 6. PREGNANCY: "Is there any chance that you are pregnant?" "When was your last menstrual period?"     No  Protocols used: Medication Refill and Renewal Call-A-AH

## 2023-09-04 NOTE — Telephone Encounter (Signed)
Refills sent on insulin on 09/03/2023. Mjp,lpn  Copied from CRM [id number]. Topic: Clinical - Medication Question >> Sep 04, 2023  9:30 AM Adelina Mings wrote: Reason for CRM: need refill on mediciation

## 2023-09-04 NOTE — Telephone Encounter (Signed)
Disp Refills Start End   insulin glargine (LANTUS) 100 unit/mL SOPN 3 mL 11 09/02/2023 --   Sig - Route: Inject 10 Units into the skin daily. - Subcutaneous   Sent to pharmacy as: insulin glargine (LANTUS) 100 unit/mL Solution Pen-injector   E-Prescribing Status: Receipt confirmed by pharmacy (09/02/2023  4:48 PM EST)      Copied from CRM #[id number]. Topic: Clinical - Prescription Issue >> Sep 04, 2023  3:50 PM Deaijah H wrote: Reason for CRM: Pt is running out of medication  insulin glargine (LANTUS) 100 unit/mL SOPN and is requesting refill asap

## 2023-09-09 ENCOUNTER — Ambulatory Visit: Payer: Medicare HMO | Admitting: Family Medicine

## 2023-09-09 ENCOUNTER — Encounter: Payer: Self-pay | Admitting: Family Medicine

## 2023-09-09 VITALS — BP 130/70 | Temp 98.5°F | Ht 65.0 in | Wt 209.0 lb

## 2023-09-09 DIAGNOSIS — M79604 Pain in right leg: Secondary | ICD-10-CM

## 2023-09-09 DIAGNOSIS — Z23 Encounter for immunization: Secondary | ICD-10-CM

## 2023-09-09 DIAGNOSIS — M79605 Pain in left leg: Secondary | ICD-10-CM | POA: Diagnosis not present

## 2023-09-09 NOTE — Progress Notes (Unsigned)
Subjective:  HPI: Amanda Riddle is a 88 y.o. female presenting on 09/09/2023 for Follow-up (Both legs painful started a month)   HPI Patient is in today for bilateral leg pain for approximately 1 month. Described as aching at night. No restlessness. Denies swelling, redness, warmth, calf pain, joint pain, numbness or tingling. Relieved by warm towels. Is taking Atorvastatin but this is not new, no new medications. No new physical activity, trauma, or injury, or recent travel or surgeries or history of DVT.  Review of Systems  All other systems reviewed and are negative.   Relevant past medical history reviewed and updated as indicated.   Past Medical History:  Diagnosis Date   Diabetes mellitus    Diverticulosis    Hypercholesteremia    Hypertension      Past Surgical History:  Procedure Laterality Date   ABDOMINAL HYSTERECTOMY  1986   CATARACT EXTRACTION W/PHACO Right 11/12/2021   Procedure: CATARACT EXTRACTION PHACO AND INTRAOCULAR LENS PLACEMENT (IOC);  Surgeon: Fabio Pierce, MD;  Location: [facility];  Service: Ophthalmology;  Laterality: Right;  CDE: 11.79   TONSILLECTOMY  1950    Allergies and medications reviewed and updated.   Current Outpatient Medications:    Alcohol Swabs PADS, USE TO CLEAN SKIN PRIOR TO CHECKING BLOOD GLUCOSE, Disp: 100 each, Rfl: 0   amLODipine (NORVASC) 5 MG tablet, Take 1 tablet (5 mg total) by mouth daily., Disp: 100 tablet, Rfl: 2   benzonatate (TESSALON) 200 MG capsule, Take 1 capsule (200 mg total) by mouth 3 (three) times daily as needed for cough., Disp: 20 capsule, Rfl: 0   Blood Glucose Calibration (TRUE METRIX LEVEL 1) Low SOLN, USE AS DIRECTED TO CHECK METER, Disp: 1 each, Rfl: 12   Blood Glucose Monitoring Suppl (TRUE METRIX METER) w/Device KIT, Use to check blood sugar up to 3 times per day., Disp: 1 kit, Rfl: 1   clotrimazole-betamethasone (LOTRISONE) cream, APPLY 1 APPLICATION TOPICALLY TWICE A DAY, Disp: 30 g, Rfl: 0    Cyanocobalamin (VITAMIN B 12 PO), Take 1 tablet by mouth at bedtime., Disp: , Rfl:    diclofenac Sodium (VOLTAREN) 1 % GEL, Apply 4 g topically 4 (four) times daily., Disp: 50 g, Rfl: 1   Flaxseed, Linseed, (FLAXSEED OIL PO), Take 1 capsule by mouth 2 (two) times daily. , Disp: , Rfl:    furosemide (LASIX) 40 MG tablet, TAKE 1 TABLET BY MOUTH EVERY DAY, Disp: 90 tablet, Rfl: 0   glucose blood (TRUE METRIX BLOOD GLUCOSE TEST) test strip, Use as instructed, Disp: 100 each, Rfl: 12   insulin glargine (LANTUS) 100 unit/mL SOPN, Inject 10 Units into the skin daily., Disp: 3 mL, Rfl: 11   Insulin Syringe-Needle U-100 (B-D INS SYR ULTRAFINE .3CC/31G) 31G X 5/16" 0.3 ML MISC, Use as directed to inject insulin SQ 2x daiy. Dx: E11.65, Disp: 300 each, Rfl: 3   metFORMIN (GLUCOPHAGE) 1000 MG tablet, TAKE 1 TABLET TWICE DAILY, Disp: 180 tablet, Rfl: 1   pioglitazone (ACTOS) 30 MG tablet, TAKE 1 TABLET BY MOUTH DAILY, Disp: 100 tablet, Rfl: 2   potassium chloride (KLOR-CON) 10 MEQ tablet, TAKE 1 TABLET BY MOUTH EVERY DAY, Disp: 90 tablet, Rfl: 0   TRUEplus Lancets 30G MISC, TEST BLOOD SUGAR THREE TIMES DAILY AS DIRECTED, Disp: 300 each, Rfl: 1  Allergies  Allergen Reactions   Codeine Itching   Lisinopril Swelling    Objective:   BP 130/70   Temp 98.5 F (36.9 C) (Oral)   Ht 5'  5" (1.651 m)   Wt 209 lb (94.8 kg)   BMI 34.78 kg/m      09/09/2023    4:16 PM 08/14/2023    2:32 PM 04/04/2023    9:47 AM  Vitals with BMI  Height 5\' 5"  5\' 5"  5\' 5"   Weight 209 lbs 202 lbs 202 lbs 10 oz  BMI 34.78 33.61 33.71  Systolic 130 -- 120  Diastolic 70 -- 72  Pulse   71     Physical Exam Vitals and nursing note reviewed.  Constitutional:      Appearance: Normal appearance. She is normal weight.  HENT:     Head: Normocephalic and atraumatic.  Cardiovascular:     Pulses:          Dorsalis pedis pulses are 2+ on the right side and 2+ on the left side.       Posterior tibial pulses are 2+ on the right  side and 2+ on the left side.  Musculoskeletal:        General: No swelling. Normal range of motion.     Right lower leg: No edema.     Left lower leg: No edema.  Feet:     Right foot:     Skin integrity: Skin integrity normal.     Left foot:     Skin integrity: Skin integrity normal.  Skin:    General: Skin is warm and dry.     Capillary Refill: Capillary refill takes less than 2 seconds.  Neurological:     General: No focal deficit present.     Mental Status: She is alert and oriented to person, place, and time. Mental status is at baseline.  Psychiatric:        Mood and Affect: Mood normal.        Behavior: Behavior normal.        Thought Content: Thought content normal.        Judgment: Judgment normal.     Assessment & Plan:  Leg pain, bilateral Assessment & Plan: General lower extremity myalgias without restlessness. Will hold Atorvastatin for now and recheck labs in 4 weeks. May continue warm compresses and NSAIDs. Will consider gabapentin if not improvement. Return to office if symptoms persist or worsen to include swelling, warmth, redness, or changes in sensation.   Need for vaccination -     Flu Vaccine Trivalent High Dose (Fluad)     Follow up plan: Return in about 6 weeks (around 10/21/2023), or if symptoms worsen or fail to improve, for recheck cholesterol.  Park Meo, FNP

## 2023-09-10 DIAGNOSIS — M79604 Pain in right leg: Secondary | ICD-10-CM | POA: Insufficient documentation

## 2023-09-10 NOTE — Assessment & Plan Note (Signed)
General lower extremity myalgias without restlessness. Will hold Atorvastatin for now and recheck labs in 4 weeks. May continue warm compresses and NSAIDs. Will consider gabapentin if not improvement. Return to office if symptoms persist or worsen to include swelling, warmth, redness, or changes in sensation.

## 2023-09-16 ENCOUNTER — Ambulatory Visit: Payer: Medicare HMO | Admitting: Family Medicine

## 2023-10-01 ENCOUNTER — Other Ambulatory Visit: Payer: Self-pay

## 2023-10-01 DIAGNOSIS — I509 Heart failure, unspecified: Secondary | ICD-10-CM

## 2023-10-01 DIAGNOSIS — I10 Essential (primary) hypertension: Secondary | ICD-10-CM

## 2023-10-01 NOTE — Telephone Encounter (Signed)
Prescription Request  10/01/2023  LOV: 09/09/23  What is the name of the medication or equipment? furosemide (LASIX) 40 MG tablet [[id number]]   Have you contacted your pharmacy to request a refill? Yes   Which pharmacy would you like this sent to?  [id number] [place], [place] - [address] [place] [address] Phone: [phone] Fax: [phone]    Patient notified that their request is being sent to the clinical staff for review and that they should receive a response within 2 business days.   Please advise at [facility] [place] [phone]

## 2023-10-01 NOTE — Telephone Encounter (Signed)
Prescription Request  10/01/2023  LOV: 09/09/23  What is the name of the medication or equipment? potassium chloride (KLOR-CON) 10 MEQ tablet [[id number]]   Have you contacted your pharmacy to request a refill? Yes   Which pharmacy would you like this sent to?  [id number] [place], [place] - [address] AT [address] [facility] [address] [place] [address] Phone: [phone] Fax: [phone]    Patient notified that their request is being sent to the clinical staff for review and that they should receive a response within 2 business days.   Please advise at [facility] [phone]

## 2023-10-02 MED ORDER — POTASSIUM CHLORIDE ER 10 MEQ PO TBCR: 10.0000 meq | EXTENDED_RELEASE_TABLET | Freq: Every day | ORAL | 1 refills | Status: DC

## 2023-10-02 NOTE — Telephone Encounter (Signed)
Rx was sent to same pharmacy as requested on 08/14/23 #90/0.   Requested Prescriptions  Refused Prescriptions Disp Refills   furosemide (LASIX) 40 MG tablet 90 tablet 0    Sig: Take 1 tablet (40 mg total) by mouth daily.     Cardiovascular:  Diuretics - Loop Failed - 10/02/2023  9:49 AM      Failed - K in normal range and within 180 days    Potassium  Date Value Ref Range Status  04/04/2023 4.2 3.5 - 5.3 mmol/L Final         Failed - Ca in normal range and within 180 days    Calcium  Date Value Ref Range Status  04/04/2023 9.4 8.6 - 10.4 mg/dL Final         Failed - Na in normal range and within 180 days    Sodium  Date Value Ref Range Status  04/04/2023 142 135 - 146 mmol/L Final         Failed - Cr in normal range and within 180 days    Creat  Date Value Ref Range Status  04/04/2023 0.95 0.60 - 0.95 mg/dL Final   Creatinine, Urine  Date Value Ref Range Status  01/02/2023 26 20 - 275 mg/dL Final         Failed - Cl in normal range and within 180 days    Chloride  Date Value Ref Range Status  04/04/2023 106 98 - 110 mmol/L Final         Failed - Mg Level in normal range and within 180 days    Magnesium  Date Value Ref Range Status  01/17/2012 1.5 1.5 - 2.5 mg/dL Final         Failed - Valid encounter within last 6 months    Recent Outpatient Visits           1 year ago Controlled type 2 diabetes mellitus with complication, without long-term current use of insulin (HCC)   [facility] Pickard, Priscille Heidelberg, MD   1 year ago Leg swelling   [facility] Donita Brooks, MD   1 year ago Leg swelling   [facility] Donita Brooks, MD   1 year ago Leg swelling   [facility] Donita Brooks, MD   2 years ago Acute pain of right knee   [facility] Cathlean Marseilles A, NP              Passed - Last BP in normal range    BP Readings from Last 1 Encounters:  09/09/23 130/70

## 2023-10-02 NOTE — Telephone Encounter (Signed)
Requested Prescriptions  Pending Prescriptions Disp Refills   potassium chloride (KLOR-CON) 10 MEQ tablet 90 tablet 1    Sig: Take 1 tablet (10 mEq total) by mouth daily.     Endocrinology:  Minerals - Potassium Supplementation Failed - 10/02/2023  9:53 AM      Failed - Valid encounter within last 12 months    Recent Outpatient Visits           1 year ago Controlled type 2 diabetes mellitus with complication, without long-term current use of insulin (HCC)   [facility] Pickard, Priscille Heidelberg, MD   1 year ago Leg swelling   [facility] Pickard, Priscille Heidelberg, MD   1 year ago Leg swelling   [facility] Tanya Nones, Priscille Heidelberg, MD   1 year ago Leg swelling   [facility] Donita Brooks, MD   2 years ago Acute pain of right knee   [facility] Valentino Nose, NP              Passed - K in normal range and within 360 days    Potassium  Date Value Ref Range Status  04/04/2023 4.2 3.5 - 5.3 mmol/L Final         Passed - Cr in normal range and within 360 days    Creat  Date Value Ref Range Status  04/04/2023 0.95 0.60 - 0.95 mg/dL Final   Creatinine, Urine  Date Value Ref Range Status  01/02/2023 26 20 - 275 mg/dL Final

## 2023-10-07 ENCOUNTER — Ambulatory Visit (INDEPENDENT_AMBULATORY_CARE_PROVIDER_SITE_OTHER): Payer: Medicare HMO | Admitting: Family Medicine

## 2023-10-07 VITALS — BP 128/76 | HR 76 | Temp 98.0°F | Ht 65.0 in | Wt 203.0 lb

## 2023-10-07 DIAGNOSIS — E1169 Type 2 diabetes mellitus with other specified complication: Secondary | ICD-10-CM | POA: Diagnosis not present

## 2023-10-07 DIAGNOSIS — Z794 Long term (current) use of insulin: Secondary | ICD-10-CM | POA: Diagnosis not present

## 2023-10-07 DIAGNOSIS — E119 Type 2 diabetes mellitus without complications: Secondary | ICD-10-CM | POA: Diagnosis not present

## 2023-10-07 MED ORDER — DEXCOM G7 RECEIVER DEVI: 1.0000 | Freq: Once | 0 refills | Status: DC

## 2023-10-07 MED ORDER — DEXCOM G7 SENSOR MISC: 1.0000 | 6 refills | Status: DC

## 2023-10-07 NOTE — Progress Notes (Signed)
Subjective:    Patient ID: Amanda Riddle, female    DOB: Feb 14, 1935, 88 y.o.   MRN: [id number]  Patient is here today to recheck her diabetes.  She is currently on Lantus 10 units a day in the morning.  Her 7-day average is 170.  Her 14-day average is 160.  Her 30-day average is 150.  Her 90-day averages around 150.  However she is only checking her blood sugar once a day.  The time when she checks it varies greatly.  She will check it if she feels bad.  She also will check it at random times after she has eaten and also when fasting.  Therefore it is difficult to ascertain what this average means.  She denies any polyuria or polydipsia or blurry vision.  She is only checking her sugar once a day because she hurts and is tired of pricking her finger Past Medical History:  Diagnosis Date   Diabetes mellitus    Diverticulosis    Hypercholesteremia    Hypertension    Past Surgical History:  Procedure Laterality Date   ABDOMINAL HYSTERECTOMY  1986   CATARACT EXTRACTION W/PHACO Right 11/12/2021   Procedure: CATARACT EXTRACTION PHACO AND INTRAOCULAR LENS PLACEMENT (IOC);  Surgeon: Fabio Pierce, MD;  Location: [facility];  Service: Ophthalmology;  Laterality: Right;  CDE: 11.79   TONSILLECTOMY  1950   Current Outpatient Medications on File Prior to Visit  Medication Sig Dispense Refill   Alcohol Swabs PADS USE TO CLEAN SKIN PRIOR TO CHECKING BLOOD GLUCOSE 100 each 0   amLODipine (NORVASC) 5 MG tablet Take 1 tablet (5 mg total) by mouth daily. 100 tablet 2   benzonatate (TESSALON) 200 MG capsule Take 1 capsule (200 mg total) by mouth 3 (three) times daily as needed for cough. 20 capsule 0   Blood Glucose Calibration (TRUE METRIX LEVEL 1) Low SOLN USE AS DIRECTED TO CHECK METER 1 each 12   Blood Glucose Monitoring Suppl (TRUE METRIX METER) w/Device KIT Use to check blood sugar up to 3 times per day. 1 kit 1   furosemide (LASIX) 40 MG tablet TAKE 1 TABLET BY MOUTH EVERY DAY 90 tablet 0   glucose  blood (TRUE METRIX BLOOD GLUCOSE TEST) test strip Use as instructed 100 each 12   insulin glargine (LANTUS) 100 unit/mL SOPN Inject 10 Units into the skin daily. 3 mL 11   Insulin Syringe-Needle U-100 (B-D INS SYR ULTRAFINE .3CC/31G) 31G X 5/16" 0.3 ML MISC Use as directed to inject insulin SQ 2x daiy. Dx: E11.65 300 each 3   metFORMIN (GLUCOPHAGE) 1000 MG tablet TAKE 1 TABLET TWICE DAILY 180 tablet 1   pioglitazone (ACTOS) 30 MG tablet TAKE 1 TABLET BY MOUTH DAILY 100 tablet 2   potassium chloride (KLOR-CON) 10 MEQ tablet Take 1 tablet (10 mEq total) by mouth daily. 90 tablet 1   TRUEplus Lancets 30G MISC TEST BLOOD SUGAR THREE TIMES DAILY AS DIRECTED 300 each 1   clotrimazole-betamethasone (LOTRISONE) cream APPLY 1 APPLICATION TOPICALLY TWICE A DAY (Patient not taking: Reported on 10/07/2023) 30 g 0   Cyanocobalamin (VITAMIN B 12 PO) Take 1 tablet by mouth at bedtime. (Patient not taking: Reported on 10/07/2023)     diclofenac Sodium (VOLTAREN) 1 % GEL Apply 4 g topically 4 (four) times daily. (Patient not taking: Reported on 10/07/2023) 50 g 1   Flaxseed, Linseed, (FLAXSEED OIL PO) Take 1 capsule by mouth 2 (two) times daily.  (Patient not taking: Reported on 10/07/2023)  No current facility-administered medications on file prior to visit.     Allergies  Allergen Reactions   Codeine Itching   Lisinopril Swelling   Social History   Socioeconomic History   Marital status: Divorced    Spouse name: Not on file   Number of children: 2   Years of education: Not on file   Highest education level: Not on file  Occupational History   Not on file  Tobacco Use   Smoking status: Never   Smokeless tobacco: Current    Types: Snuff  Vaping Use   Vaping status: Never Used  Substance and Sexual Activity   Alcohol use: No   Drug use: No   Sexual activity: Not Currently  Other Topics Concern   Not on file  Social History Narrative   Not on file   Social Drivers of Health    Financial Resource Strain: Low Risk  (08/14/2023)   Overall Financial Resource Strain (CARDIA)    Difficulty of Paying Living Expenses: Not very hard  Food Insecurity: Food Insecurity Present (08/14/2023)   Hunger Vital Sign    Worried About Running Out of Food in the Last Year: Sometimes true    Ran Out of Food in the Last Year: Sometimes true  Transportation Needs: No Transportation Needs (08/14/2023)   PRAPARE - Administrator, Civil Service (Medical): No    Lack of Transportation (Non-Medical): No  Physical Activity: Inactive (08/14/2023)   Exercise Vital Sign    Days of Exercise per Week: 0 days    Minutes of Exercise per Session: 0 min  Stress: No Stress Concern Present (08/14/2023)   [employer] of Occupational Health - Occupational Stress Questionnaire    Feeling of Stress : Not at all  Social Connections: Moderately Isolated (08/14/2023)   Social Connection and Isolation Panel [NHANES]    Frequency of Communication with Friends and Family: More than three times a week    Frequency of Social Gatherings with Friends and Family: Twice a week    Attends Religious Services: 1 to 4 times per year    Active Member of [employer] or Organizations: No    Attends Banker Meetings: Never    Marital Status: Divorced  Catering manager Violence: Not At Risk (08/14/2023)   Humiliation, Afraid, Rape, and Kick questionnaire    Fear of Current or Ex-Partner: No    Emotionally Abused: No    Physically Abused: No    Sexually Abused: No     Review of Systems  All other systems reviewed and are negative.      Objective:   Physical Exam Vitals reviewed.  Constitutional:      Appearance: She is obese.  Cardiovascular:     Rate and Rhythm: Normal rate and regular rhythm.     Heart sounds: Normal heart sounds. No murmur heard.    No friction rub. No gallop.  Pulmonary:     Effort: Pulmonary effort is normal. No respiratory distress.     Breath sounds:  Normal breath sounds. No stridor. No wheezing, rhonchi or rales.  Chest:     Chest wall: No tenderness.  Musculoskeletal:     Right lower leg: No edema.     Left lower leg: No edema.  Neurological:     Mental Status: She is alert.           Assessment & Plan:  Type 2 diabetes mellitus without complication, with long-term current use of insulin (HCC) - Plan: Hemoglobin  A1c, COMPLETE METABOLIC PANEL WITH GFR, Lipid panel Increase Lantus to 13 units a day.  Meanwhile check hemoglobin A1c, fasting lipid panel, and CMP.  Switch the patient to Dexcom continuous blood glucose monitoring.  I would like her to check her blood sugars at least 3 times a day fasting after lunch and after dinner.  I want her to record these and bring these values in in 1 week so that we can further titrate her insulin to achieve better glycemic control.  Blood pressure today is excellent.

## 2023-10-08 LAB — LIPID PANEL
Cholesterol: 163 mg/dL (ref <200)
HDL: 60 mg/dL (ref >=50)
LDL Cholesterol (Calc): 84 mg/dL
Non-HDL Cholesterol (Calc): 103 mg/dL (ref <130)
Total CHOL/HDL Ratio: 2.7 (calc) (ref <5.0)
Triglycerides: 101 mg/dL (ref <150)

## 2023-10-08 LAB — COMPLETE METABOLIC PANEL WITH GFR
AG Ratio: 1.8 (calc) (ref 1.0–2.5)
ALT: 9 U/L (ref 6–29)
AST: 13 U/L (ref 10–35)
Albumin: 4.1 g/dL (ref 3.6–5.1)
Alkaline phosphatase (APISO): 75 U/L (ref 37–153)
BUN: 9 mg/dL (ref 7–25)
CO2: 25 mmol/L (ref 20–32)
Calcium: 9.4 mg/dL (ref 8.6–10.4)
Chloride: 103 mmol/L (ref 98–110)
Creat: 0.9 mg/dL (ref 0.60–0.95)
Globulin: 2.3 g/dL (ref 1.9–3.7)
Glucose, Bld: 159 mg/dL — ABNORMAL HIGH (ref 65–99)
Potassium: 3.8 mmol/L (ref 3.5–5.3)
Sodium: 140 mmol/L (ref 135–146)
Total Bilirubin: 1.1 mg/dL (ref 0.2–1.2)
Total Protein: 6.4 g/dL (ref 6.1–8.1)
eGFR: 62 mL/min/{1.73_m2} (ref >=60)

## 2023-10-08 LAB — HEMOGLOBIN A1C
Hgb A1c MFr Bld: 8.1 %{Hb} — ABNORMAL HIGH (ref <5.7)
Mean Plasma Glucose: 186 mg/dL
eAG (mmol/L): 10.3 mmol/L

## 2023-10-16 ENCOUNTER — Ambulatory Visit: Payer: Medicare HMO | Admitting: Family Medicine

## 2023-11-07 ENCOUNTER — Other Ambulatory Visit: Payer: Self-pay | Admitting: Family Medicine

## 2023-11-07 DIAGNOSIS — E119 Type 2 diabetes mellitus without complications: Secondary | ICD-10-CM

## 2023-11-29 ENCOUNTER — Other Ambulatory Visit: Payer: Self-pay | Admitting: Family Medicine

## 2023-11-29 DIAGNOSIS — I10 Essential (primary) hypertension: Secondary | ICD-10-CM

## 2023-12-31 ENCOUNTER — Ambulatory Visit: Payer: Self-pay | Admitting: Family Medicine

## 2023-12-31 NOTE — Telephone Encounter (Signed)
  Chief Complaint: cough Symptoms: cough Frequency: constant since yesterday Pertinent Negatives: Patient denies chest pain, shortness of breath Disposition: [] ED /[] Urgent Care (no appt availability in office) / [x] Appointment(In office/virtual)/ []  [facility]/ [] Home Care/ [] Refused Recommended Disposition /[] [facility]/ []  Follow-up with PCP Additional Notes:  Calling requesting cough medicine and inhaler refill. Dry cough, feels like a ton of bricks. Cough is constant and kept her awake all night. Has not measured temperature. Denies chest pain. Yesterday start OTC coricidin without effect. Requesting cough medicine and inhaler refill. History of bronchitis. Office visit scheduled. Care advise given per protocol. Discussed reasons to call back.   Copied from CRM [id number]. Topic: Clinical - Medication Question >> Dec 31, 2023 10:01 AM Clayton Bibles wrote: Reason for CRM: Amanda Riddle wants to see if doctor will call in prescription for cough syrup and will refill a discontinued medication albuterol (PROVENTIL HFA;VENTOLIN HFA) 108 (90 BASE) MCG/ACT inhaler.  Please call Amanda Riddle today at [phone] and let her know if this can be done. Reason for Disposition  [1] Continuous (nonstop) coughing interferes with work or school AND [2] no improvement using cough treatment per Care Advice  Answer Assessment - Initial Assessment Questions 1. ONSET: "When did the cough begin?"      2 days ago 2. SEVERITY: "How bad is the cough today?"      Worsening  3. SPUTUM: "Describe the color of your sputum" (none, dry cough; clear, white, yellow, green)     Dry 4. HEMOPTYSIS: "Are you coughing up any blood?" If so ask: "How much?" (flecks, streaks, tablespoons, etc.)     Dry cough 5. DIFFICULTY BREATHING: "Are you having difficulty breathing?" If Yes, ask: "How bad is it?" (e.g., mild, moderate, severe)    - MILD: No SOB at rest, mild SOB with walking, speaks normally in sentences, can lie down, no  retractions, pulse < 100.    - MODERATE: SOB at rest, SOB with minimal exertion and prefers to sit, cannot lie down flat, speaks in phrases, mild retractions, audible wheezing, pulse 100-120.    - SEVERE: Very SOB at rest, speaks in single words, struggling to breathe, sitting hunched forward, retractions, pulse > 120      Denies  6. FEVER: "Do you have a fever?" If Yes, ask: "What is your temperature, how was it measured, and when did it start?"     Unsure has not measured  7. CARDIAC HISTORY: "Do you have any history of heart disease?" (e.g., heart attack, congestive heart failure)      Think I'm on blood pressure medicine 8. LUNG HISTORY: "Do you have any history of lung disease?"  (e.g., pulmonary embolus, asthma, emphysema)     Bronchitis  9. OTHER SYMPTOMS: "Do you have any other symptoms?" (e.g., runny nose, wheezing, chest pain) chest heaviness  Protocols used: Cough - Acute Non-Productive-A-AH

## 2024-01-01 ENCOUNTER — Encounter: Payer: Self-pay | Admitting: Family Medicine

## 2024-01-01 ENCOUNTER — Ambulatory Visit (INDEPENDENT_AMBULATORY_CARE_PROVIDER_SITE_OTHER): Admitting: Family Medicine

## 2024-01-01 VITALS — BP 122/80 | HR 98 | Temp 100.5°F | Ht 65.0 in

## 2024-01-01 DIAGNOSIS — E119 Type 2 diabetes mellitus without complications: Secondary | ICD-10-CM

## 2024-01-01 DIAGNOSIS — Z794 Long term (current) use of insulin: Secondary | ICD-10-CM | POA: Diagnosis not present

## 2024-01-01 DIAGNOSIS — J069 Acute upper respiratory infection, unspecified: Secondary | ICD-10-CM | POA: Diagnosis not present

## 2024-01-01 DIAGNOSIS — R6889 Other general symptoms and signs: Secondary | ICD-10-CM

## 2024-01-01 DIAGNOSIS — R42 Dizziness and giddiness: Secondary | ICD-10-CM | POA: Diagnosis not present

## 2024-01-01 LAB — INFLUENZA A AND B AG, IMMUNOASSAY
INFLUENZA A ANTIGEN: NOT DETECTED
INFLUENZA B ANTIGEN: NOT DETECTED

## 2024-01-01 LAB — GLUCOSE 16585: Glucose: 181 mg/dL — ABNORMAL HIGH (ref 65–99)

## 2024-01-01 MED ORDER — ALBUTEROL SULFATE HFA 108 (90 BASE) MCG/ACT IN AERS: 2.0000 | INHALATION_SPRAY | Freq: Four times a day (QID) | RESPIRATORY_TRACT | 0 refills | Status: DC | PRN

## 2024-01-01 NOTE — Progress Notes (Signed)
 Subjective:  HPI: Amanda Riddle is a 89 y.o. female presenting on 01/01/2024 for Cough (X 5 days.) and Dizziness (Started feeling dizzy this morning. )   Cough  Dizziness Associated symptoms include coughing.   Patient is in today for 3 days dry cough, malaise, wheezing, rhinorrhea,  Denies SOB, fever, chills, body aches, pleurisy, N/V/D No known sick exposure or home covid test Has tried coriciden with some improvement.  Review of Systems  Respiratory:  Positive for cough.   Neurological:  Positive for dizziness.  All other systems reviewed and are negative.   Relevant past medical history reviewed and updated as indicated.   Past Medical History:  Diagnosis Date   Diabetes mellitus    Diverticulosis    Hypercholesteremia    Hypertension      Past Surgical History:  Procedure Laterality Date   ABDOMINAL HYSTERECTOMY  1986   CATARACT EXTRACTION W/PHACO Right 11/12/2021   Procedure: CATARACT EXTRACTION PHACO AND INTRAOCULAR LENS PLACEMENT (IOC);  Surgeon: Fabio Pierce, MD;  Location: [facility];  Service: Ophthalmology;  Laterality: Right;  CDE: 11.79   TONSILLECTOMY  1950    Allergies and medications reviewed and updated.   Current Outpatient Medications:    albuterol (VENTOLIN HFA) 108 (90 Base) MCG/ACT inhaler, Inhale 2 puffs into the lungs every 6 (six) hours as needed for wheezing or shortness of breath., Disp: 8 g, Rfl: 0   Alcohol Swabs (DROPSAFE ALCOHOL PREP) 70 % PADS, USE TO CLEAN SKIN PRIOR TO CHECKING BLOOD GLUCOSE, Disp: 100 each, Rfl: 0   amLODipine (NORVASC) 5 MG tablet, TAKE 1 TABLET EVERY DAY, Disp: 90 tablet, Rfl: 3   atorvastatin (LIPITOR) 40 MG tablet, TAKE 1 TABLET EVERY DAY, Disp: 90 tablet, Rfl: 3   benzonatate (TESSALON) 200 MG capsule, Take 1 capsule (200 mg total) by mouth 3 (three) times daily as needed for cough., Disp: 20 capsule, Rfl: 0   Blood Glucose Calibration (TRUE METRIX LEVEL 1) Low SOLN, USE AS DIRECTED TO CHECK METER, Disp: 1 each,  Rfl: 12   Blood Glucose Monitoring Suppl (TRUE METRIX AIR GLUCOSE METER) w/Device KIT, USE AS DIRECTED, Disp: 1 kit, Rfl: 3   clotrimazole-betamethasone (LOTRISONE) cream, APPLY 1 APPLICATION TOPICALLY TWICE A DAY, Disp: 30 g, Rfl: 0   Continuous Glucose Sensor (DEXCOM G7 SENSOR) MISC, 1 Device by Does not apply route every 14 (fourteen) days., Disp: 2 each, Rfl: 6   Cyanocobalamin (VITAMIN B 12 PO), Take 1 tablet by mouth at bedtime., Disp: , Rfl:    diclofenac Sodium (VOLTAREN) 1 % GEL, Apply 4 g topically 4 (four) times daily., Disp: 50 g, Rfl: 1   Flaxseed, Linseed, (FLAXSEED OIL PO), Take 1 capsule by mouth 2 (two) times daily., Disp: , Rfl:    furosemide (LASIX) 40 MG tablet, TAKE 1 TABLET BY MOUTH EVERY DAY, Disp: 90 tablet, Rfl: 0   glucose blood (TRUE METRIX BLOOD GLUCOSE TEST) test strip, Use as instructed, Disp: 100 each, Rfl: 12   insulin glargine (LANTUS) 100 unit/mL SOPN, Inject 10 Units into the skin daily., Disp: 3 mL, Rfl: 11   Insulin Syringe-Needle U-100 (B-D INS SYR ULTRAFINE .3CC/31G) 31G X 5/16" 0.3 ML MISC, Use as directed to inject insulin SQ 2x daiy. Dx: E11.65, Disp: 300 each, Rfl: 3   Lancets Ultra Thin 30G MISC, TEST BLOOD SUGAR THREE TIMES DAILY AS DIRECTED, Disp: 300 each, Rfl: 3   metFORMIN (GLUCOPHAGE) 1000 MG tablet, TAKE 1 TABLET TWICE DAILY, Disp: 180 tablet, Rfl: 1  pioglitazone (ACTOS) 30 MG tablet, TAKE 1 TABLET BY MOUTH DAILY, Disp: 100 tablet, Rfl: 2   potassium chloride (KLOR-CON) 10 MEQ tablet, Take 1 tablet (10 mEq total) by mouth daily., Disp: 90 tablet, Rfl: 1  Allergies  Allergen Reactions   Codeine Itching   Lisinopril Swelling    Objective:   BP 122/80   Pulse 98   Temp (!) 100.5 F (38.1 C)   Ht 5\' 5"  (1.651 m)   SpO2 99%   BMI 33.78 kg/m      01/01/2024   12:26 PM 01/01/2024   12:10 PM 10/07/2023   10:16 AM  Vitals with BMI  Height  5\' 5"  5\' 5"   Weight   203 lbs  BMI   33.78  Systolic  122 128  Diastolic  80 76  Pulse 98 104  76     Physical Exam Vitals and nursing note reviewed.  Constitutional:      Appearance: Normal appearance. She is normal weight.  HENT:     Head: Normocephalic and atraumatic.     Right Ear: Tympanic membrane, ear canal and external ear normal.     Left Ear: Tympanic membrane, ear canal and external ear normal.     Nose: Congestion present.     Right Sinus: No maxillary sinus tenderness or frontal sinus tenderness.     Left Sinus: No maxillary sinus tenderness or frontal sinus tenderness.     Mouth/Throat:     Mouth: Mucous membranes are dry.     Pharynx: Oropharynx is clear.  Eyes:     Conjunctiva/sclera: Conjunctivae normal.     Pupils: Pupils are equal, round, and reactive to light.  Cardiovascular:     Rate and Rhythm: Normal rate and regular rhythm.     Pulses: Normal pulses.     Heart sounds: Normal heart sounds.  Pulmonary:     Effort: Pulmonary effort is normal.     Breath sounds: Normal breath sounds.  Musculoskeletal:     Cervical back: No tenderness.  Lymphadenopathy:     Cervical: No cervical adenopathy.  Skin:    General: Skin is warm and dry.  Neurological:     General: No focal deficit present.     Mental Status: She is alert and oriented to person, place, and time. Mental status is at baseline.  Psychiatric:        Mood and Affect: Mood normal.        Behavior: Behavior normal.        Thought Content: Thought content normal.        Judgment: Judgment normal.     Assessment & Plan:  Viral URI with cough Assessment & Plan: Flu negative, will check home Covid for same day results and report to office if positive. Reassured patient that symptoms and exam findings are most consistent with a viral upper respiratory infection and explained lack of efficacy of antibiotics against viruses.  Discussed expected course and features suggestive of secondary bacterial infection.  Continue supportive care. Increase fluid intake with water or electrolyte solution like  pedialyte. Encouraged acetaminophen as needed for fever/pain. Encouraged salt water gargling, chloraseptic spray and throat lozenges. Encouraged OTC guaifenesin. Encouraged saline sinus flushes and/or neti with humidified air.     Type 2 diabetes mellitus without complication, with long-term current use of insulin (HCC) Assessment & Plan: CBG 181  Orders: -     POCT Glucose Fingerstick  Dizziness -     POCT Glucose Fingerstick  Flu-like symptoms -  Influenza A and B Ag, Immunoassay  Other orders -     Albuterol Sulfate HFA; Inhale 2 puffs into the lungs every 6 (six) hours as needed for wheezing or shortness of breath.  Dispense: 8 g; Refill: 0     Follow up plan: Return if symptoms worsen or fail to improve.  Park Meo, FNP

## 2024-01-01 NOTE — Assessment & Plan Note (Addendum)
 Flu negative, will check home Covid for same day results and report to office if positive. Reassured patient that symptoms and exam findings are most consistent with a viral upper respiratory infection and explained lack of efficacy of antibiotics against viruses.  Discussed expected course and features suggestive of secondary bacterial infection.  Continue supportive care. Increase fluid intake with water or electrolyte solution like pedialyte. Encouraged acetaminophen as needed for fever/pain. Encouraged salt water gargling, chloraseptic spray and throat lozenges. Encouraged OTC guaifenesin. Encouraged saline sinus flushes and/or neti with humidified air.

## 2024-01-01 NOTE — Assessment & Plan Note (Signed)
CBG 181.  

## 2024-01-02 ENCOUNTER — Encounter (HOSPITAL_COMMUNITY): Payer: Self-pay

## 2024-01-02 ENCOUNTER — Emergency Department (HOSPITAL_COMMUNITY)

## 2024-01-02 ENCOUNTER — Emergency Department (HOSPITAL_COMMUNITY)
Admission: EM | Admit: 2024-01-02 | Discharge: 2024-01-02 | Disposition: A | Discharge status: Home / Self Care | Attending: Student | Admitting: Student

## 2024-01-02 ENCOUNTER — Other Ambulatory Visit: Payer: Self-pay

## 2024-01-02 DIAGNOSIS — R079 Chest pain, unspecified: Secondary | ICD-10-CM | POA: Diagnosis not present

## 2024-01-02 DIAGNOSIS — Z794 Long term (current) use of insulin: Secondary | ICD-10-CM | POA: Diagnosis not present

## 2024-01-02 DIAGNOSIS — M25552 Pain in left hip: Secondary | ICD-10-CM | POA: Diagnosis not present

## 2024-01-02 DIAGNOSIS — I6782 Cerebral ischemia: Secondary | ICD-10-CM | POA: Insufficient documentation

## 2024-01-02 DIAGNOSIS — R748 Abnormal levels of other serum enzymes: Secondary | ICD-10-CM | POA: Insufficient documentation

## 2024-01-02 DIAGNOSIS — R059 Cough, unspecified: Secondary | ICD-10-CM | POA: Diagnosis not present

## 2024-01-02 DIAGNOSIS — S0990XA Unspecified injury of head, initial encounter: Secondary | ICD-10-CM | POA: Diagnosis not present

## 2024-01-02 DIAGNOSIS — S199XXA Unspecified injury of neck, initial encounter: Secondary | ICD-10-CM | POA: Diagnosis not present

## 2024-01-02 DIAGNOSIS — Z7984 Long term (current) use of oral hypoglycemic drugs: Secondary | ICD-10-CM | POA: Insufficient documentation

## 2024-01-02 DIAGNOSIS — E119 Type 2 diabetes mellitus without complications: Secondary | ICD-10-CM | POA: Insufficient documentation

## 2024-01-02 DIAGNOSIS — Z79899 Other long term (current) drug therapy: Secondary | ICD-10-CM | POA: Diagnosis not present

## 2024-01-02 DIAGNOSIS — I10 Essential (primary) hypertension: Secondary | ICD-10-CM | POA: Diagnosis not present

## 2024-01-02 DIAGNOSIS — M4802 Spinal stenosis, cervical region: Secondary | ICD-10-CM | POA: Diagnosis not present

## 2024-01-02 DIAGNOSIS — M8580 Other specified disorders of bone density and structure, unspecified site: Secondary | ICD-10-CM | POA: Diagnosis not present

## 2024-01-02 DIAGNOSIS — W19XXXA Unspecified fall, initial encounter: Secondary | ICD-10-CM | POA: Diagnosis not present

## 2024-01-02 DIAGNOSIS — M47812 Spondylosis without myelopathy or radiculopathy, cervical region: Secondary | ICD-10-CM | POA: Diagnosis not present

## 2024-01-02 DIAGNOSIS — R102 Pelvic and perineal pain: Secondary | ICD-10-CM | POA: Diagnosis not present

## 2024-01-02 DIAGNOSIS — W1839XA Other fall on same level, initial encounter: Secondary | ICD-10-CM | POA: Insufficient documentation

## 2024-01-02 LAB — COMPREHENSIVE METABOLIC PANEL
ALT: 21 U/L (ref 0–44)
AST: 35 U/L (ref 15–41)
Albumin: 3.7 g/dL (ref 3.5–5.0)
Alkaline Phosphatase: 68 U/L (ref 38–126)
Anion gap: 13 (ref 5–15)
BUN: 9 mg/dL (ref 8–23)
CO2: 21 mmol/L — ABNORMAL LOW (ref 22–32)
Calcium: 8.9 mg/dL (ref 8.9–10.3)
Chloride: 105 mmol/L (ref 98–111)
Creatinine, Ser: 0.92 mg/dL (ref 0.44–1.00)
GFR, Estimated: >60 mL/min (ref >60)
Glucose, Bld: 196 mg/dL — ABNORMAL HIGH (ref 70–99)
Potassium: 3.4 mmol/L — ABNORMAL LOW (ref 3.5–5.1)
Sodium: 139 mmol/L (ref 135–145)
Total Bilirubin: 1.4 mg/dL — ABNORMAL HIGH (ref 0.0–1.2)
Total Protein: 6.8 g/dL (ref 6.5–8.1)

## 2024-01-02 LAB — CBC WITH DIFFERENTIAL/PLATELET
Abs Immature Granulocytes: 0.01 10*3/uL (ref 0.00–0.07)
Basophils Absolute: 0 10*3/uL (ref 0.0–0.1)
Basophils Relative: 0 %
Eosinophils Absolute: 0 10*3/uL (ref 0.0–0.5)
Eosinophils Relative: 0 %
HCT: 38.1 % (ref 36.0–46.0)
Hemoglobin: 12.7 g/dL (ref 12.0–15.0)
Immature Granulocytes: 0 %
Lymphocytes Relative: 40 %
Lymphs Abs: 1.8 10*3/uL (ref 0.7–4.0)
MCH: 29.3 pg (ref 26.0–34.0)
MCHC: 33.3 g/dL (ref 30.0–36.0)
MCV: 87.8 fL (ref 80.0–100.0)
Monocytes Absolute: 0.6 10*3/uL (ref 0.1–1.0)
Monocytes Relative: 13 %
Neutro Abs: 2.1 10*3/uL (ref 1.7–7.7)
Neutrophils Relative %: 47 %
Platelets: 166 10*3/uL (ref 150–400)
RBC: 4.34 MIL/uL (ref 3.87–5.11)
RDW: 13.4 % (ref 11.5–15.5)
WBC: 4.5 10*3/uL (ref 4.0–10.5)
nRBC: 0 % (ref 0.0–0.2)

## 2024-01-02 LAB — I-STAT CG4 LACTIC ACID, ED
Lactic Acid, Venous: 2.1 mmol/L (ref 0.5–1.9)
Lactic Acid, Venous: 1.9 mmol/L (ref 0.5–1.9)

## 2024-01-02 LAB — CK: Total CK: 508 U/L — ABNORMAL HIGH (ref 38–234)

## 2024-01-02 LAB — TROPONIN I (HIGH SENSITIVITY): Troponin I (High Sensitivity): 9 ng/L (ref <18)

## 2024-01-02 MED ORDER — POTASSIUM CHLORIDE CRYS ER 20 MEQ PO TBCR
40.0000 meq | EXTENDED_RELEASE_TABLET | Freq: Once | ORAL | Status: AC
  Administered 2024-01-02: 40 meq via ORAL
  Filled 2024-01-02: qty 2

## 2024-01-02 MED ORDER — LACTATED RINGERS IV BOLUS
1000.0000 mL | Freq: Once | INTRAVENOUS | Status: AC
  Administered 2024-01-02: 1000 mL via INTRAVENOUS

## 2024-01-02 MED ORDER — MAGNESIUM OXIDE -MG SUPPLEMENT 400 (240 MG) MG PO TABS
800.0000 mg | ORAL_TABLET | Freq: Once | ORAL | Status: AC
  Administered 2024-01-02: 800 mg via ORAL
  Filled 2024-01-02: qty 2

## 2024-01-02 NOTE — ED Provider Notes (Signed)
 [place] EMERGENCY DEPARTMENT AT [facility] Provider Note  CSN: [id number] Arrival date & time: 01/02/24 1007  Chief Complaint(s) Fall  HPI Amanda Riddle is a 89 y.o. female with PMH T2DM, diverticulosis, HTN, HLD who presents emergency room for evaluation of a fall.  Patient states that yesterday she went to see her primary care physician about cough.  She fell in her house and was unable to get off the ground.  Reportedly was on the ground for 18 hours.  She is endorsing some mild left-sided hip pain but otherwise is alert and oriented answering all questions appropriately.  Denies chest pain, shortness of breath, abdominal pain, numbness, tingling, weakness or any other systemic, traumatic or neurologic complaints.   Past Medical History Past Medical History:  Diagnosis Date   Diabetes mellitus    Diverticulosis    Hypercholesteremia    Hypertension    Patient Active Problem List   Diagnosis Date Noted   Viral URI with cough 01/01/2024   Leg pain, bilateral 09/10/2023   Pain due to onychomycosis of toenails of both feet 05/29/2021   Great toe pain, right 10/25/2020   Pain in joint, shoulder region 07/06/2013   Muscle spasms of neck 07/06/2013   Hyperglycemia 01/17/2012   HTN (hypertension) 01/17/2012   Hyperlipidemia 01/17/2012   DM (diabetes mellitus) (HCC) 01/17/2012   Home Medication(s) Prior to Admission medications   Medication Sig Start Date End Date Taking? Authorizing Provider  albuterol (VENTOLIN HFA) 108 (90 Base) MCG/ACT inhaler Inhale 2 puffs into the lungs every 6 (six) hours as needed for wheezing or shortness of breath. 01/01/24   Park Meo, FNP  Alcohol Swabs (DROPSAFE ALCOHOL PREP) 70 % PADS USE TO CLEAN SKIN PRIOR TO CHECKING BLOOD GLUCOSE 12/12/23   Donita Brooks, MD  amLODipine (NORVASC) 5 MG tablet TAKE 1 TABLET EVERY DAY 12/01/23   Donita Brooks, MD  atorvastatin (LIPITOR) 40 MG tablet TAKE 1 TABLET EVERY DAY 11/07/23   Donita Brooks, MD  benzonatate (TESSALON) 200 MG capsule Take 1 capsule (200 mg total) by mouth 3 (three) times daily as needed for cough. 03/13/23   Donita Brooks, MD  Blood Glucose Calibration (TRUE METRIX LEVEL 1) Low SOLN USE AS DIRECTED TO CHECK METER 05/02/23   Donita Brooks, MD  Blood Glucose Monitoring Suppl (TRUE METRIX AIR GLUCOSE METER) w/Device KIT USE AS DIRECTED 12/12/23   Donita Brooks, MD  clotrimazole-betamethasone (LOTRISONE) cream APPLY 1 APPLICATION TOPICALLY TWICE A DAY 09/11/22   Donita Brooks, MD  Continuous Glucose Sensor (DEXCOM G7 SENSOR) MISC 1 Device by Does not apply route every 14 (fourteen) days. 10/07/23   Donita Brooks, MD  Cyanocobalamin (VITAMIN B 12 PO) Take 1 tablet by mouth at bedtime.    [provider]  diclofenac Sodium (VOLTAREN) 1 % GEL Apply 4 g topically 4 (four) times daily. 08/13/21   Valentino Nose, NP  Flaxseed, Linseed, (FLAXSEED OIL PO) Take 1 capsule by mouth 2 (two) times daily.    [provider]  furosemide (LASIX) 40 MG tablet TAKE 1 TABLET BY MOUTH EVERY DAY 08/14/23   Donita Brooks, MD  glucose blood (TRUE METRIX BLOOD GLUCOSE TEST) test strip Use as instructed 05/02/23   Donita Brooks, MD  insulin glargine (LANTUS) 100 unit/mL SOPN Inject 10 Units into the skin daily. 09/04/23   Donita Brooks, MD  Insulin Syringe-Needle U-100 (B-D INS SYR ULTRAFINE .3CC/31G) 31G X 5/16" 0.3  ML MISC Use as directed to inject insulin SQ 2x daiy. Dx: E11.65 01/26/21   Valentino Nose, NP  Lancets Ultra Thin 30G MISC TEST BLOOD SUGAR THREE TIMES DAILY AS DIRECTED 11/07/23   Donita Brooks, MD  metFORMIN (GLUCOPHAGE) 1000 MG tablet TAKE 1 TABLET TWICE DAILY 08/26/23   Donita Brooks, MD  pioglitazone (ACTOS) 30 MG tablet TAKE 1 TABLET BY MOUTH DAILY 10/28/22   Donita Brooks, MD  potassium chloride (KLOR-CON) 10 MEQ tablet Take 1 tablet (10 mEq total) by mouth daily. 10/02/23   Donita Brooks, MD                                                                                                                                     Past Surgical History Past Surgical History:  Procedure Laterality Date   ABDOMINAL HYSTERECTOMY  1986   CATARACT EXTRACTION W/PHACO Right 11/12/2021   Procedure: CATARACT EXTRACTION PHACO AND INTRAOCULAR LENS PLACEMENT (IOC);  Surgeon: Fabio Pierce, MD;  Location: [facility];  Service: Ophthalmology;  Laterality: Right;  CDE: 11.79   TONSILLECTOMY  1950   Family History Family History  Problem Relation Age of Onset   Colon cancer Neg Hx    Breast cancer Neg Hx     Social History Social History   Tobacco Use   Smoking status: Never   Smokeless tobacco: Current    Types: Snuff  Vaping Use   Vaping status: Never Used  Substance Use Topics   Alcohol use: No   Drug use: No   Allergies Codeine and Lisinopril  Review of Systems Review of Systems  Musculoskeletal:  Positive for arthralgias and myalgias.    Physical Exam Vital Signs  I have reviewed the triage vital signs BP (!) 137/58   Pulse 77   Temp 98.2 F (36.8 C) (Oral)   Resp (!) 22   Ht 5\' 5"  (1.651 m)   Wt 94.8 kg   SpO2 100%   BMI 34.78 kg/m   Physical Exam Vitals and nursing note reviewed.  Constitutional:      General: She is not in acute distress.    Appearance: She is well-developed.  HENT:     Head: Normocephalic and atraumatic.  Eyes:     Conjunctiva/sclera: Conjunctivae normal.  Pulmonary:     Effort: Pulmonary effort is normal. No respiratory distress.  Musculoskeletal:        General: No swelling.     Cervical back: Neck supple.  Skin:    General: Skin is warm and dry.     Capillary Refill: Capillary refill takes less than 2 seconds.  Neurological:     Mental Status: She is alert.  Psychiatric:        Mood and Affect: Mood normal.     ED Results and Treatments Labs (all labs ordered are listed, but only abnormal results are displayed) Labs Reviewed  I-STAT CG4  LACTIC ACID, ED - Abnormal; Notable for the following components:      Result Value   Lactic Acid, Venous 2.1 (*)    All other components within normal limits  COMPREHENSIVE METABOLIC PANEL  CBC WITH DIFFERENTIAL/PLATELET  CK  URINALYSIS, ROUTINE W REFLEX MICROSCOPIC  TROPONIN I (HIGH SENSITIVITY)                                                                                                                          Radiology No results found.  Pertinent labs & imaging results that were available during my care of the patient were reviewed by me and considered in my medical decision making (see MDM for details).  Medications Ordered in ED Medications  lactated ringers bolus 1,000 mL (has no administration in time range)                                                                                                                                     Procedures Procedures  (including critical care time)  Medical Decision Making / ED Course   This patient presents to the ED for concern of fall, this involves an extensive number of treatment options, and is a complaint that carries with it a high risk of complications and morbidity.  The differential diagnosis includes fracture, contusion, hematoma, ligamentous injury, closed head injury, ICH, laceration, intrathoracic injury, intra-abdominal injury, electrolyte abnormality, rhabdo  MDM: Patient seen emergency room for evaluation of a fall.  Physical exam with some mild tenderness of the left hip but straight leg test is negative, no tenderness with external rotation.  No external signs of trauma over the chest abdomen, head or neck.  Trauma imaging including CT head and C-spine, chest x-ray, pelvis x-ray reassuringly negative.  Laboratory evaluation with some mild hypokalemia to 3.4 which was repleted, CK5 08 but creatinine is normal, initial lactic acid elevated to 2.1 but improved after fluid resuscitation.  ECG nonischemic.  Patient  able to ambulate here in the Emergency Department and with negative trauma workup and only very minimal CK elevation, she does not meet inpatient criteria for admission and will be discharged with outpatient follow-up.  Return precautions given which she voiced understanding patient discharged.   Additional history obtained: -Additional history obtained from son -External records from outside source obtained and reviewed including: Chart review including previous notes, labs, imaging, consultation notes   Lab  Tests: -I ordered, reviewed, and interpreted labs.   The pertinent results include:   Labs Reviewed  I-STAT CG4 LACTIC ACID, ED - Abnormal; Notable for the following components:      Result Value   Lactic Acid, Venous 2.1 (*)    All other components within normal limits  COMPREHENSIVE METABOLIC PANEL  CBC WITH DIFFERENTIAL/PLATELET  CK  URINALYSIS, ROUTINE W REFLEX MICROSCOPIC  TROPONIN I (HIGH SENSITIVITY)      EKG   EKG Interpretation Date/Time:  Friday January 02 2024 10:08:38 EDT Ventricular Rate:  79 PR Interval:  146 QRS Duration:  80 QT Interval:  365 QTC Calculation: 419 R Axis:   80  Text Interpretation: Sinus rhythm Borderline T abnormalities, anterior leads Confirmed by Duru Reiger ([id number]) on 01/02/2024 10:20:03 PM         Imaging Studies ordered: I ordered imaging studies including CT head, C-spine, chest x-ray, pelvis x-ray I independently visualized and interpreted imaging. I agree with the radiologist interpretation   Medicines ordered and prescription drug management: Meds ordered this encounter  Medications   lactated ringers bolus 1,000 mL    -I have reviewed the patients home medicines and have made adjustments as needed  Critical interventions none   Cardiac Monitoring: The patient was maintained on a cardiac monitor.  I personally viewed and interpreted the cardiac monitored which showed an underlying rhythm of: NSR  Social  Determinants of Health:  Factors impacting patients care include: none   Reevaluation: After the interventions noted above, I reevaluated the patient and found that they have :improved  Co morbidities that complicate the patient evaluation  Past Medical History:  Diagnosis Date   Diabetes mellitus    Diverticulosis    Hypercholesteremia    Hypertension       Dispostion: I considered admission for this patient, but at this time she does not meet inpatient criteria for admission and will be discharged with outpatient follow-up     Final Clinical Impression(s) / ED Diagnoses Final diagnoses:  None     @PCDICTATION @    Glendora Score, MD 01/02/24 2221

## 2024-01-02 NOTE — ED Triage Notes (Signed)
 Pt BIB [facility] from home where she was found down on her bedroom floor for approx. 18 hrs by her friend. Pt reports she was going to bed yesterday & slipped on something in her floor & landed on her Rt side, denies hitting her head, no LOC, remembers the event & was able to get into a sitting position but could not get up. Pt c/o bil hip & back pain to EMS, A/Ox4, 140/60, CBG 183, 99% RA, NSR on 12 L. EMS reports that her home was very cluttered/filthy covered in belongings d/t her hoarding tendencies & dog feces/urine (per EMS) & there were 2 aggressive dogs in the home that took them approx. 40 minutes to get by to get to pt. Pt also endorses seeing her PCP yesterday d/t recent cold s/s the last couple of weeks as well.

## 2024-01-02 NOTE — ED Notes (Signed)
 X-ray at bedside

## 2024-01-05 ENCOUNTER — Other Ambulatory Visit: Payer: Self-pay | Admitting: Family Medicine

## 2024-01-05 DIAGNOSIS — I509 Heart failure, unspecified: Secondary | ICD-10-CM

## 2024-01-05 DIAGNOSIS — I10 Essential (primary) hypertension: Secondary | ICD-10-CM

## 2024-01-06 NOTE — Telephone Encounter (Signed)
 Requested Prescriptions  Pending Prescriptions Disp Refills   potassium chloride (KLOR-CON) 10 MEQ tablet [Pharmacy Med Name: Potassium Chloride ER Oral Tablet Extended Release 10 MEQ] 90 tablet 0    Sig: TAKE 1 TABLET EVERY DAY     Endocrinology:  Minerals - Potassium Supplementation Failed - 01/06/2024  3:11 PM      Failed - K in normal range and within 360 days    Potassium  Date Value Ref Range Status  01/02/2024 3.4 (L) 3.5 - 5.1 mmol/L Final         Failed - Valid encounter within last 12 months    Recent Outpatient Visits           1 year ago Controlled type 2 diabetes mellitus with complication, without long-term current use of insulin (HCC)   [facility] Pickard, Priscille Heidelberg, MD   2 years ago Leg swelling   [facility] Tanya Nones, Priscille Heidelberg, MD   2 years ago Leg swelling   [facility] Tanya Nones, Priscille Heidelberg, MD   2 years ago Leg swelling   [facility] Donita Brooks, MD   2 years ago Acute pain of right knee   [facility] Valentino Nose, NP              Passed - Cr in normal range and within 360 days    Creat  Date Value Ref Range Status  10/07/2023 0.90 0.60 - 0.95 mg/dL Final   Creatinine, Ser  Date Value Ref Range Status  01/02/2024 0.92 0.44 - 1.00 mg/dL Final   Creatinine, Urine  Date Value Ref Range Status  01/02/2023 26 20 - 275 mg/dL Final          furosemide (LASIX) 40 MG tablet [Pharmacy Med Name: Furosemide Oral Tablet 40 MG] 90 tablet 0    Sig: TAKE 1 TABLET EVERY DAY     Cardiovascular:  Diuretics - Loop Failed - 01/06/2024  3:11 PM      Failed - K in normal range and within 180 days    Potassium  Date Value Ref Range Status  01/02/2024 3.4 (L) 3.5 - 5.1 mmol/L Final         Failed - Mg Level in normal range and within 180 days    Magnesium  Date Value Ref Range Status  01/17/2012 1.5 1.5 - 2.5 mg/dL Final         Failed - Valid encounter within last 6  months    Recent Outpatient Visits           1 year ago Controlled type 2 diabetes mellitus with complication, without long-term current use of insulin (HCC)   [facility] Pickard, Priscille Heidelberg, MD   2 years ago Leg swelling   [facility] Donita Brooks, MD   2 years ago Leg swelling   [facility] Donita Brooks, MD   2 years ago Leg swelling   [facility] Donita Brooks, MD   2 years ago Acute pain of right knee   [facility] Valentino Nose, NP              Passed - Ca in normal range and within 180 days    Calcium  Date Value Ref Range Status  01/02/2024 8.9 8.9 - 10.3 mg/dL Final         Passed - Na in  normal range and within 180 days    Sodium  Date Value Ref Range Status  01/02/2024 139 135 - 145 mmol/L Final         Passed - Cr in normal range and within 180 days    Creat  Date Value Ref Range Status  10/07/2023 0.90 0.60 - 0.95 mg/dL Final   Creatinine, Ser  Date Value Ref Range Status  01/02/2024 0.92 0.44 - 1.00 mg/dL Final   Creatinine, Urine  Date Value Ref Range Status  01/02/2023 26 20 - 275 mg/dL Final         Passed - Cl in normal range and within 180 days    Chloride  Date Value Ref Range Status  01/02/2024 105 98 - 111 mmol/L Final         Passed - Last BP in normal range    BP Readings from Last 1 Encounters:  01/02/24 (!) 103/50

## 2024-01-08 ENCOUNTER — Encounter: Payer: Self-pay | Admitting: Family Medicine

## 2024-01-08 ENCOUNTER — Ambulatory Visit (INDEPENDENT_AMBULATORY_CARE_PROVIDER_SITE_OTHER): Admitting: Family Medicine

## 2024-01-08 ENCOUNTER — Other Ambulatory Visit: Payer: Self-pay | Admitting: Family Medicine

## 2024-01-08 VITALS — BP 140/76 | HR 76 | Temp 97.6°F | Ht 65.0 in | Wt 199.4 lb

## 2024-01-08 DIAGNOSIS — E119 Type 2 diabetes mellitus without complications: Secondary | ICD-10-CM

## 2024-01-08 DIAGNOSIS — Z794 Long term (current) use of insulin: Secondary | ICD-10-CM

## 2024-01-08 DIAGNOSIS — E1165 Type 2 diabetes mellitus with hyperglycemia: Secondary | ICD-10-CM | POA: Diagnosis not present

## 2024-01-08 MED ORDER — BENZONATATE 200 MG PO CAPS: 200.0000 mg | ORAL_CAPSULE | Freq: Three times a day (TID) | ORAL | 0 refills | Status: DC

## 2024-01-08 NOTE — Telephone Encounter (Signed)
 Requested medication (s) are due for refill today - yes  Requested medication (s) are on the active medication list -yes  Future visit scheduled -yes-today  Last refill: 12/12/23 #100  Notes to clinic: off protocol- provider review , diabetic supplies  Requested Prescriptions  Pending Prescriptions Disp Refills   Alcohol Swabs (DROPSAFE ALCOHOL PREP) 70 % PADS [Pharmacy Med Name: DropSafe Alcohol Prep Pad 70 %] 100 each 11    Sig: USE TO CLEAN SKIN PRIOR TO CHECKING BLOOD GLUCOSE     Off-Protocol Failed - 01/08/2024 11:37 AM      Failed - Medication not assigned to a protocol, review manually.      Failed - Valid encounter within last 12 months    Recent Outpatient Visits           1 year ago Controlled type 2 diabetes mellitus with complication, without long-term current use of insulin (HCC)   [facility] Pickard, Priscille Heidelberg, MD   2 years ago Leg swelling   [facility] Tanya Nones, Priscille Heidelberg, MD   2 years ago Leg swelling   [facility] Tanya Nones, Priscille Heidelberg, MD   2 years ago Leg swelling   [facility] Donita Brooks, MD   2 years ago Acute pain of right knee   [facility] Valentino Nose, NP                 Requested Prescriptions  Pending Prescriptions Disp Refills   Alcohol Swabs (DROPSAFE ALCOHOL PREP) 70 % PADS [Pharmacy Med Name: DropSafe Alcohol Prep Pad 70 %] 100 each 11    Sig: USE TO CLEAN SKIN PRIOR TO CHECKING BLOOD GLUCOSE     Off-Protocol Failed - 01/08/2024 11:37 AM      Failed - Medication not assigned to a protocol, review manually.      Failed - Valid encounter within last 12 months    Recent Outpatient Visits           1 year ago Controlled type 2 diabetes mellitus with complication, without long-term current use of insulin (HCC)   [facility] Pickard, Priscille Heidelberg, MD   2 years ago Leg swelling   [facility] Tanya Nones, Priscille Heidelberg, MD    2 years ago Leg swelling   [facility] Tanya Nones, Priscille Heidelberg, MD   2 years ago Leg swelling   [facility] Donita Brooks, MD   2 years ago Acute pain of right knee   [facility] Valentino Nose, NP

## 2024-01-08 NOTE — Progress Notes (Signed)
 Subjective:    Patient ID: Amanda Riddle, female    DOB: 06-Sep-1935, 89 y.o.   MRN: [id number]  Patient recently went to the emergency room.  She was seen in our office and diagnosed with viral upper respiratory infection.  When she got home, she fell trying to get into bed.  She does not remember exactly what happened.  However she states that her son found her in the floor almost 18 hours later.  She was unable to get up and get out of the floor and get help.  In the emergency room, lactic acid levels were elevated at 2.1, CK levels were elevated greater than 500.  Fortunately her creatinine was stable.  She was given IV fluids in the emergency room.  Lactic acid levels fell to 1.9.  Chest x-ray was negative for pneumonia.  CT scan of the neck and head were negative for any acute injury.  Patient is here today for follow-up.  She reports occasional persistent cough.  The cough is nonproductive.  However she is now afebrile.  She denies any shortness of breath.  However blood sugars been greater then 150 in the morning.  She denies hypoglycemic episodes. Past Medical History:  Diagnosis Date   Diabetes mellitus    Diverticulosis    Hypercholesteremia    Hypertension    Past Surgical History:  Procedure Laterality Date   ABDOMINAL HYSTERECTOMY  1986   CATARACT EXTRACTION W/PHACO Right 11/12/2021   Procedure: CATARACT EXTRACTION PHACO AND INTRAOCULAR LENS PLACEMENT (IOC);  Surgeon: Fabio Pierce, MD;  Location: [facility];  Service: Ophthalmology;  Laterality: Right;  CDE: 11.79   TONSILLECTOMY  1950   Current Outpatient Medications on File Prior to Visit  Medication Sig Dispense Refill   albuterol (VENTOLIN HFA) 108 (90 Base) MCG/ACT inhaler Inhale 2 puffs into the lungs every 6 (six) hours as needed for wheezing or shortness of breath. 8 g 0   amLODipine (NORVASC) 5 MG tablet TAKE 1 TABLET EVERY DAY 90 tablet 3   atorvastatin (LIPITOR) 40 MG tablet TAKE 1 TABLET EVERY DAY 90 tablet 3   Blood  Glucose Calibration (TRUE METRIX LEVEL 1) Low SOLN USE AS DIRECTED TO CHECK METER 1 each 12   Blood Glucose Monitoring Suppl (TRUE METRIX AIR GLUCOSE METER) w/Device KIT USE AS DIRECTED 1 kit 3   clotrimazole-betamethasone (LOTRISONE) cream APPLY 1 APPLICATION TOPICALLY TWICE A DAY 30 g 0   Continuous Glucose Sensor (DEXCOM G7 SENSOR) MISC 1 Device by Does not apply route every 14 (fourteen) days. 2 each 6   Cyanocobalamin (VITAMIN B 12 PO) Take 1 tablet by mouth at bedtime.     diclofenac Sodium (VOLTAREN) 1 % GEL Apply 4 g topically 4 (four) times daily. 50 g 1   Flaxseed, Linseed, (FLAXSEED OIL PO) Take 1 capsule by mouth 2 (two) times daily.     furosemide (LASIX) 40 MG tablet TAKE 1 TABLET EVERY DAY 90 tablet 0   glucose blood (TRUE METRIX BLOOD GLUCOSE TEST) test strip Use as instructed 100 each 12   insulin glargine (LANTUS) 100 unit/mL SOPN Inject 10 Units into the skin daily. 3 mL 11   Insulin Syringe-Needle U-100 (B-D INS SYR ULTRAFINE .3CC/31G) 31G X 5/16" 0.3 ML MISC Use as directed to inject insulin SQ 2x daiy. Dx: E11.65 300 each 3   Lancets Ultra Thin 30G MISC TEST BLOOD SUGAR THREE TIMES DAILY AS DIRECTED 300 each 3   metFORMIN (GLUCOPHAGE) 1000 MG tablet TAKE 1 TABLET  TWICE DAILY 180 tablet 1   pioglitazone (ACTOS) 30 MG tablet TAKE 1 TABLET BY MOUTH DAILY 100 tablet 2   potassium chloride (KLOR-CON) 10 MEQ tablet TAKE 1 TABLET EVERY DAY 90 tablet 0   benzonatate (TESSALON) 200 MG capsule Take 1 capsule (200 mg total) by mouth 3 (three) times daily as needed for cough. (Patient not taking: Reported on 01/08/2024) 20 capsule 0   No current facility-administered medications on file prior to visit.     Allergies  Allergen Reactions   Codeine Itching   Lisinopril Swelling   Social History   Socioeconomic History   Marital status: Divorced    Spouse name: Not on file   Number of children: 2   Years of education: Not on file   Highest education level: Not on file   Occupational History   Not on file  Tobacco Use   Smoking status: Never   Smokeless tobacco: Current    Types: Snuff  Vaping Use   Vaping status: Never Used  Substance and Sexual Activity   Alcohol use: No   Drug use: No   Sexual activity: Not Currently  Other Topics Concern   Not on file  Social History Narrative   Not on file   Social Drivers of Health   Financial Resource Strain: Low Risk  (08/14/2023)   Overall Financial Resource Strain (CARDIA)    Difficulty of Paying Living Expenses: Not very hard  Food Insecurity: Food Insecurity Present (08/14/2023)   Hunger Vital Sign    Worried About Running Out of Food in the Last Year: Sometimes true    Ran Out of Food in the Last Year: Sometimes true  Transportation Needs: No Transportation Needs (08/14/2023)   PRAPARE - Administrator, Civil Service (Medical): No    Lack of Transportation (Non-Medical): No  Physical Activity: Inactive (08/14/2023)   Exercise Vital Sign    Days of Exercise per Week: 0 days    Minutes of Exercise per Session: 0 min  Stress: No Stress Concern Present (08/14/2023)   [employer] of Occupational Health - Occupational Stress Questionnaire    Feeling of Stress : Not at all  Social Connections: Moderately Isolated (08/14/2023)   Social Connection and Isolation Panel [NHANES]    Frequency of Communication with Friends and Family: More than three times a week    Frequency of Social Gatherings with Friends and Family: Twice a week    Attends Religious Services: 1 to 4 times per year    Active Member of [employer] or Organizations: No    Attends Banker Meetings: Never    Marital Status: Divorced  Catering manager Violence: Not At Risk (08/14/2023)   Humiliation, Afraid, Rape, and Kick questionnaire    Fear of Current or Ex-Partner: No    Emotionally Abused: No    Physically Abused: No    Sexually Abused: No     Review of Systems  All other systems reviewed and are  negative.      Objective:   Physical Exam Vitals reviewed.  Constitutional:      Appearance: She is obese.  Cardiovascular:     Rate and Rhythm: Normal rate and regular rhythm.     Heart sounds: Normal heart sounds. No murmur heard.    No friction rub. No gallop.  Pulmonary:     Effort: Pulmonary effort is normal. No respiratory distress.     Breath sounds: Normal breath sounds. No stridor. No wheezing, rhonchi or rales.  Chest:     Chest wall: No tenderness.  Musculoskeletal:     Right lower leg: No edema.     Left lower leg: No edema.  Neurological:     Mental Status: She is alert.           Assessment & Plan:  Type 2 diabetes mellitus with hyperglycemia, with long-term current use of insulin (HCC) Lungs are completely clear.  I believe the patient is slowly recovering from a viral upper respiratory illness.  Recommended Tessalon Perles 200 mg every 8 hours as needed for cough.  She is doing much better now.  She is walking without difficulty.  She denies any shortness of breath or chest pain.  Recommended she increase her Lantus.  She is currently on 15 units.  I recommended that she increase her Lantus by 1 unit every day until her fasting blood sugars fall below 100 and.  I anticipate that she likely needs between 20 and 25 units of insulin.  Recheck in 1 to 2 weeks with an update on her blood sugars

## 2024-01-24 ENCOUNTER — Other Ambulatory Visit: Payer: Self-pay | Admitting: Family Medicine

## 2024-01-26 ENCOUNTER — Other Ambulatory Visit: Payer: Self-pay | Admitting: Family Medicine

## 2024-01-26 DIAGNOSIS — E118 Type 2 diabetes mellitus with unspecified complications: Secondary | ICD-10-CM

## 2024-01-27 NOTE — Telephone Encounter (Signed)
 Requested Prescriptions  Pending Prescriptions Disp Refills   metFORMIN (GLUCOPHAGE) 1000 MG tablet [Pharmacy Med Name: metFORMIN HCl Oral Tablet 1000 MG] 180 tablet 3    Sig: TAKE 1 TABLET TWICE DAILY     Endocrinology:  Diabetes - Biguanides Failed - 01/27/2024 12:19 PM      Failed - HBA1C is between 0 and 7.9 and within 180 days    Hgb A1c MFr Bld  Date Value Ref Range Status  10/07/2023 8.1 (H) <5.7 % of total Hgb Final    Comment:    For someone without known diabetes, a hemoglobin A1c value of 6.5% or greater indicates that they may have  diabetes and this should be confirmed with a follow-up  test. . For someone with known diabetes, a value <7% indicates  that their diabetes is well controlled and a value  greater than or equal to 7% indicates suboptimal  control. A1c targets should be individualized based on  duration of diabetes, age, comorbid conditions, and  other considerations. . Currently, no consensus exists regarding use of hemoglobin A1c for diagnosis of diabetes for children. .          Failed - B12 Level in normal range and within 720 days    No results found for: "VITAMINB12"       Passed - Cr in normal range and within 360 days    Creat  Date Value Ref Range Status  10/07/2023 0.90 0.60 - 0.95 mg/dL Final   Creatinine, Ser  Date Value Ref Range Status  01/02/2024 0.92 0.44 - 1.00 mg/dL Final   Creatinine, Urine  Date Value Ref Range Status  01/02/2023 26 20 - 275 mg/dL Final         Passed - eGFR in normal range and within 360 days    GFR, Est African American  Date Value Ref Range Status  02/08/2021 58 (L) > OR = 60 mL/min/1.42m2 Final   GFR, Est Non African American  Date Value Ref Range Status  02/08/2021 50 (L) > OR = 60 mL/min/1.31m2 Final   GFR, Estimated  Date Value Ref Range Status  01/02/2024 >60 >60 mL/min Final    Comment:    (NOTE) Calculated using the CKD-EPI Creatinine Equation (2021)    eGFR  Date Value Ref Range  Status  10/07/2023 62 > OR = 60 mL/min/1.81m2 Final         Passed - Valid encounter within last 6 months    Recent Outpatient Visits           2 weeks ago Type 2 diabetes mellitus with hyperglycemia, with long-term current use of insulin (HCC)   [facility] Donita Brooks, MD   3 weeks ago Viral URI with cough   [place] [facility] Park Meo, FNP   3 months ago Type 2 diabetes mellitus without complication, with long-term current use of insulin [facility])   [place] [facility] Tanya Nones, Priscille Heidelberg, MD   4 months ago Leg pain, bilateral   [place] [facility] Park Meo, FNP   9 months ago Controlled type 2 diabetes mellitus with complication, without long-term current use of insulin [facility])   [place] [facility] Pickard, Priscille Heidelberg, MD              Passed - CBC within normal limits and completed in the last 12 months    WBC  Date Value  Ref Range Status  01/02/2024 4.5 4.0 - 10.5 K/uL Final   RBC  Date Value Ref Range Status  01/02/2024 4.34 3.87 - 5.11 MIL/uL Final   Hemoglobin  Date Value Ref Range Status  01/02/2024 12.7 12.0 - 15.0 g/dL Final   HCT  Date Value Ref Range Status  01/02/2024 38.1 36.0 - 46.0 % Final   MCHC  Date Value Ref Range Status  01/02/2024 33.3 30.0 - 36.0 g/dL Final   [facility]  Date Value Ref Range Status  01/02/2024 29.3 26.0 - 34.0 pg Final   MCV  Date Value Ref Range Status  01/02/2024 87.8 80.0 - 100.0 fL Final   No results found for: "PLTCOUNTKUC", "LABPLAT", "POCPLA" RDW  Date Value Ref Range Status  01/02/2024 13.4 11.5 - 15.5 % Final

## 2024-02-09 ENCOUNTER — Other Ambulatory Visit: Payer: Self-pay | Admitting: Family Medicine

## 2024-02-10 NOTE — Telephone Encounter (Signed)
 Requested Prescriptions  Pending Prescriptions Disp Refills   Continuous Glucose Receiver (DEXCOM G7 RECEIVER) DEVI [Pharmacy Med Name: DEXCOM G7 RECEIVER] 1 each 0    Sig: USE AS DIRECTED     Endocrinology: Diabetes - Testing Supplies Passed - 02/10/2024 12:51 PM      Passed - Valid encounter within last 12 months    Recent Outpatient Visits           1 month ago Type 2 diabetes mellitus with hyperglycemia, with long-term current use of insulin  [facility])   [place] [facility] Pickard, Cisco Crest, MD   1 month ago Viral URI with cough   [place] [facility] Jenelle Mis, FNP   4 months ago Type 2 diabetes mellitus without complication, with long-term current use of insulin  [facility])   [place] [facility] Austine Lefort, MD   5 months ago Leg pain, bilateral   [place] [facility] Jenelle Mis, FNP   10 months ago Controlled type 2 diabetes mellitus with complication, without long-term current use of insulin  [facility])   [place] [facility] Pickard, Cisco Crest, MD

## 2024-03-05 ENCOUNTER — Other Ambulatory Visit: Payer: Self-pay

## 2024-03-05 DIAGNOSIS — I509 Heart failure, unspecified: Secondary | ICD-10-CM

## 2024-03-05 DIAGNOSIS — I10 Essential (primary) hypertension: Secondary | ICD-10-CM

## 2024-03-05 NOTE — Telephone Encounter (Signed)
 Prescription Request  03/05/2024  LOV: Visit date not found  What is the name of the medication or equipment? furosemide  (LASIX ) 40 MG tablet   Have you contacted your pharmacy to request a refill? Yes   Which pharmacy would you like this sent to?    [facility]    Patient notified that their request is being sent to the clinical staff for review and that they should receive a response within 2 business days.   Please advise at Mobile There is no such number on file (mobile).

## 2024-03-08 MED ORDER — FUROSEMIDE 40 MG PO TABS: 40.0000 mg | ORAL_TABLET | Freq: Every day | ORAL | 1 refills | Status: DC

## 2024-03-08 NOTE — Telephone Encounter (Signed)
 Requested Prescriptions  Pending Prescriptions Disp Refills   furosemide  (LASIX ) 40 MG tablet 90 tablet 1    Sig: Take 1 tablet (40 mg total) by mouth daily.     Cardiovascular:  Diuretics - Loop Failed - 03/08/2024  3:39 PM      Failed - K in normal range and within 180 days    Potassium  Date Value Ref Range Status  01/02/2024 3.4 (L) 3.5 - 5.1 mmol/L Final         Failed - Mg Level in normal range and within 180 days    Magnesium   Date Value Ref Range Status  01/17/2012 1.5 1.5 - 2.5 mg/dL Final         Failed - Last BP in normal range    BP Readings from Last 1 Encounters:  01/08/24 (!) 140/76         Failed - Valid encounter within last 6 months    Recent Outpatient Visits           2 months ago Type 2 diabetes mellitus with hyperglycemia, with long-term current use of insulin  (HCC)   [place] [facility] Austine Lefort, MD   2 months ago Viral URI with cough   [facility] Jenelle Mis, FNP   5 months ago Type 2 diabetes mellitus without complication, with long-term current use of insulin  [facility])   [facility] Austine Lefort, MD   6 months ago Leg pain, bilateral   [place] [facility] Jenelle Mis, FNP   11 months ago Controlled type 2 diabetes mellitus with complication, without long-term current use of insulin  [facility])   [place] [facility] Pickard, Cisco Crest, MD              Passed - Ca in normal range and within 180 days    Calcium   Date Value Ref Range Status  01/02/2024 8.9 8.9 - 10.3 mg/dL Final         Passed - Na in normal range and within 180 days    Sodium  Date Value Ref Range Status  01/02/2024 139 135 - 145 mmol/L Final         Passed - Cr in normal range and within 180 days    Creat  Date Value Ref Range Status  10/07/2023 0.90 0.60 - 0.95 mg/dL Final   Creatinine, Ser  Date Value Ref Range Status  01/02/2024  0.92 0.44 - 1.00 mg/dL Final   Creatinine, Urine  Date Value Ref Range Status  01/02/2023 26 20 - 275 mg/dL Final         Passed - Cl in normal range and within 180 days    Chloride  Date Value Ref Range Status  01/02/2024 105 98 - 111 mmol/L Final

## 2024-03-22 ENCOUNTER — Other Ambulatory Visit: Payer: Self-pay | Admitting: Family Medicine

## 2024-03-22 ENCOUNTER — Ambulatory Visit
Admission: EM | Admit: 2024-03-22 | Discharge: 2024-03-22 | Disposition: A | Discharge status: Home / Self Care | Attending: Nurse Practitioner | Admitting: Nurse Practitioner

## 2024-03-22 DIAGNOSIS — S30860A Insect bite (nonvenomous) of lower back and pelvis, initial encounter: Secondary | ICD-10-CM

## 2024-03-22 DIAGNOSIS — S30861A Insect bite (nonvenomous) of abdominal wall, initial encounter: Secondary | ICD-10-CM | POA: Diagnosis not present

## 2024-03-22 DIAGNOSIS — R21 Rash and other nonspecific skin eruption: Secondary | ICD-10-CM | POA: Diagnosis not present

## 2024-03-22 DIAGNOSIS — I10 Essential (primary) hypertension: Secondary | ICD-10-CM

## 2024-03-22 DIAGNOSIS — A6920 Lyme disease, unspecified: Secondary | ICD-10-CM

## 2024-03-22 DIAGNOSIS — I509 Heart failure, unspecified: Secondary | ICD-10-CM

## 2024-03-22 DIAGNOSIS — W57XXXA Bitten or stung by nonvenomous insect and other nonvenomous arthropods, initial encounter: Secondary | ICD-10-CM | POA: Diagnosis not present

## 2024-03-22 MED ORDER — DOXYCYCLINE HYCLATE 100 MG PO CAPS: 100.0000 mg | ORAL_CAPSULE | Freq: Two times a day (BID) | ORAL | 0 refills | Status: AC

## 2024-03-22 MED ORDER — TRIAMCINOLONE ACETONIDE 0.1 % EX OINT: 1.0000 | TOPICAL_OINTMENT | Freq: Two times a day (BID) | CUTANEOUS | 0 refills | Status: AC

## 2024-03-22 NOTE — ED Provider Notes (Signed)
 [facility]    CSN: [id number] Arrival date & time: 03/22/24  1725      History   Chief Complaint Chief Complaint  Patient presents with   Insect Bite    HPI Amanda Riddle is a 89 y.o. female.   Patient presents today with 3 tick bites, 2 to right lower abdomen, and 1 to mid's low spine.  She reports the areas are itchy.  She is unsure if the rash is spreading or getting bigger because she cannot really see them.  She denies fever, chills, body aches or joint pain, muscle aches, neck stiffness, or headache.  Reports she woke up this morning and the right side of her jaw felt tight and she has not been able to eat a good meal as a result.  She has been drinking fluids today.    Past Medical History:  Diagnosis Date   Diabetes mellitus    Diverticulosis    Hypercholesteremia    Hypertension     Patient Active Problem List   Diagnosis Date Noted   Viral URI with cough 01/01/2024   Leg pain, bilateral 09/10/2023   Pain due to onychomycosis of toenails of both feet 05/29/2021   Great toe pain, right 10/25/2020   Pain in joint, shoulder region 07/06/2013   Muscle spasms of neck 07/06/2013   Hyperglycemia 01/17/2012   HTN (hypertension) 01/17/2012   Hyperlipidemia 01/17/2012   DM (diabetes mellitus) (HCC) 01/17/2012    Past Surgical History:  Procedure Laterality Date   ABDOMINAL HYSTERECTOMY  1986   CATARACT EXTRACTION W/PHACO Right 11/12/2021   Procedure: CATARACT EXTRACTION PHACO AND INTRAOCULAR LENS PLACEMENT (IOC);  Surgeon: Tarri Farm, MD;  Location: [facility];  Service: Ophthalmology;  Laterality: Right;  CDE: 11.79   TONSILLECTOMY  1950    OB History   No obstetric history on file.      Home Medications    Prior to Admission medications   Medication Sig Start Date End Date Taking? Authorizing Provider  amLODipine  (NORVASC ) 5 MG tablet TAKE 1 TABLET EVERY DAY 12/01/23  Yes Austine Lefort, MD  atorvastatin  (LIPITOR) 40 MG tablet TAKE 1  TABLET EVERY DAY 11/07/23  Yes Austine Lefort, MD  Blood Glucose Monitoring Suppl (TRUE METRIX AIR GLUCOSE METER) w/Device KIT USE AS DIRECTED 12/12/23  Yes Austine Lefort, MD  Cyanocobalamin (VITAMIN B 12 PO) Take 1 tablet by mouth at bedtime.   Yes [provider]  doxycycline (VIBRAMYCIN) 100 MG capsule Take 1 capsule (100 mg total) by mouth 2 (two) times daily for 10 days. 03/22/24 04/01/24 Yes Thena Fireman A, NP  Flaxseed, Linseed, (FLAXSEED OIL PO) Take 1 capsule by mouth 2 (two) times daily.   Yes [provider]  furosemide  (LASIX ) 40 MG tablet Take 1 tablet (40 mg total) by mouth daily. 03/08/24  Yes Austine Lefort, MD  insulin  glargine (LANTUS ) 100 unit/mL SOPN Inject 10 Units into the skin daily. 09/04/23  Yes Austine Lefort, MD  metFORMIN  (GLUCOPHAGE ) 1000 MG tablet TAKE 1 TABLET TWICE DAILY 01/27/24  Yes Austine Lefort, MD  potassium chloride  (KLOR-CON ) 10 MEQ tablet TAKE 1 TABLET EVERY DAY 01/06/24  Yes Austine Lefort, MD  triamcinolone  ointment (KENALOG ) 0.1 % Apply 1 Application topically 2 (two) times daily. Apply small amount twice daily as needed for itching.  Do not use for more than 14 days in a row. 03/22/24  Yes Wilhemena Harbour, NP  albuterol  (VENTOLIN  HFA) 108 (90 Base)  MCG/ACT inhaler TAKE 2 PUFFS BY MOUTH EVERY 6 HOURS AS NEEDED FOR WHEEZE OR SHORTNESS OF BREATH 01/29/24   Austine Lefort, MD  Alcohol  Swabs  (DROPSAFE ALCOHOL  PREP) 70 % PADS USE TO CLEAN SKIN PRIOR TO CHECKING BLOOD GLUCOSE 01/08/24   Austine Lefort, MD  benzonatate  (TESSALON ) 200 MG capsule Take 1 capsule (200 mg total) by mouth 3 (three) times daily as needed for cough. Patient not taking: Reported on 01/08/2024 03/13/23   Austine Lefort, MD  benzonatate  (TESSALON ) 200 MG capsule Take 1 capsule (200 mg total) by mouth 3 (three) times daily. 01/08/24   Austine Lefort, MD  Blood Glucose Calibration (TRUE METRIX LEVEL 1) Low SOLN USE AS DIRECTED TO CHECK METER 05/02/23    Austine Lefort, MD  clotrimazole -betamethasone  (LOTRISONE ) cream APPLY 1 APPLICATION TOPICALLY TWICE A DAY 09/11/22   Austine Lefort, MD  Continuous Glucose Receiver (DEXCOM G7 RECEIVER) DEVI USE AS DIRECTED 02/10/24   Austine Lefort, MD  Continuous Glucose Sensor (DEXCOM G7 SENSOR) MISC 1 Device by Does not apply route every 14 (fourteen) days. 10/07/23   Austine Lefort, MD  diclofenac  Sodium (VOLTAREN ) 1 % GEL Apply 4 g topically 4 (four) times daily. 08/13/21   Wilhemena Harbour, NP  glucose blood (TRUE METRIX BLOOD GLUCOSE TEST) test strip Use as instructed 05/02/23   Austine Lefort, MD  Insulin  Syringe-Needle U-100 (B-D INS SYR ULTRAFINE .3CC/31G) 31G X 5/16" 0.3 ML MISC Use as directed to inject insulin  SQ 2x daiy. Dx: E11.65 01/26/21   Wilhemena Harbour, NP  Lancets Ultra Thin 30G MISC TEST BLOOD SUGAR THREE TIMES DAILY AS DIRECTED 11/07/23   Austine Lefort, MD  pioglitazone  (ACTOS ) 30 MG tablet TAKE 1 TABLET BY MOUTH DAILY 10/28/22   Austine Lefort, MD    Family History Family History  Problem Relation Age of Onset   Colon cancer Neg Hx    Breast cancer Neg Hx     Social History Social History   Tobacco Use   Smoking status: Never   Smokeless tobacco: Current    Types: Snuff  Vaping Use   Vaping status: Never Used  Substance Use Topics   Alcohol  use: No   Drug use: No     Allergies   Codeine and Lisinopril    Review of Systems Review of Systems Per HPI  Physical Exam Triage Vital Signs ED Triage Vitals  Encounter Vitals Group     BP 03/22/24 1734 (!) 142/83     Systolic BP Percentile --      Diastolic BP Percentile --      Pulse Rate 03/22/24 1734 88     Resp 03/22/24 1734 18     Temp 03/22/24 1734 98.5 F (36.9 C)     Temp Source 03/22/24 1734 Oral     SpO2 03/22/24 1734 96 %     Weight --      Height --      Head Circumference --      Peak Flow --      Pain Score 03/22/24 1735 0     Pain Loc --      Pain Education --      Exclude  from Growth Chart --    No data found.  Updated Vital Signs BP (!) 142/83 (BP Location: Right Arm)   Pulse 88   Temp 98.5 F (36.9 C) (Oral)   Resp 18   SpO2 96%   Visual Acuity Right  Eye Distance:   Left Eye Distance:   Bilateral Distance:    Right Eye Near:   Left Eye Near:    Bilateral Near:     Physical Exam Vitals and nursing note reviewed.  Constitutional:      General: She is not in acute distress.    Appearance: Normal appearance. She is not toxic-appearing.  HENT:     Mouth/Throat:     Mouth: Mucous membranes are moist.     Pharynx: Oropharynx is clear.  Pulmonary:     Effort: Pulmonary effort is normal. No respiratory distress.  Skin:    General: Skin is warm and dry.     Capillary Refill: Capillary refill takes less than 2 seconds.     Findings: Rash present.     Comments: 3 distinct, circular, erythematous rashes noted to abdominal wall and lower back as depicted below. There is concern for erythema migrans to right upper abdomen rash.    Neurological:     Mental Status: She is alert and oriented to person, place, and time.  Psychiatric:        Behavior: Behavior is cooperative.           UC Treatments / Results  Labs (all labs ordered are listed, but only abnormal results are displayed) Labs Reviewed  LYME DISEASE SEROLOGY W/REFLEX  SPOTTED FEVER GROUP ANTIBODIES    EKG   Radiology No results found.  Procedures Procedures (including critical care time)  Medications Ordered in UC Medications - No data to display  Initial Impression / Assessment and Plan / UC Course  I have reviewed the triage vital signs and the nursing notes.  Pertinent labs & imaging results that were available during my care of the patient were reviewed by me and considered in my medical decision making (see chart for details).   Patient is well-appearing, normotensive, afebrile, not tachycardic, not tachypneic, oxygenating well on room air.   1. Localized  skin eruption 2. Tick bite of abdominal wall, initial encounter 3. Tick bite of lower back, initial encounter 4. Erythema migrans (Lyme disease) Vitals are stable Examination today is concerning for erythema migrans Lyme disease serology as well as antibodies for recommend spotted fever were obtained In meantime, will treat with doxycycline twice daily for 10 days Supportive care discussed for the itchiness-Kenalog  ointment sent to pharmacy Strict ER precautions discussed with patient at length  The patient was given the opportunity to ask questions.  All questions answered to their satisfaction.  The patient is in agreement to this plan.   Final Clinical Impressions(s) / UC Diagnoses   Final diagnoses:  Localized skin eruption  Tick bite of abdominal wall, initial encounter  Tick bite of lower back, initial encounter  Erythema migrans (Lyme disease)   Discharge Instructions      I am concerned about the tick bites on your abdomen today.  We are checking your blood for tick borne disease.  In the meantime, take the doxycycline twice daily for 10 days as prescribed to treat for tick borne disease.  You can use the topical steroid ointment as needed for itching.  If symptoms do not improve significantly, return for recheck with us  or PCP. If symptoms worsen, seek care emergently.  ED Prescriptions     Medication Sig Dispense Auth. Provider   doxycycline (VIBRAMYCIN) 100 MG capsule Take 1 capsule (100 mg total) by mouth 2 (two) times daily for 10 days. 20 capsule Thena Fireman A, NP   triamcinolone  ointment (KENALOG )  0.1 % Apply 1 Application topically 2 (two) times daily. Apply small amount twice daily as needed for itching.  Do not use for more than 14 days in a row. 15 g Wilhemena Harbour, NP      PDMP not reviewed this encounter.   Wilhemena Harbour, NP 03/22/24 [phone]

## 2024-03-22 NOTE — Discharge Instructions (Addendum)
 I am concerned about the tick bites on your abdomen today.  We are checking your blood for tick borne disease.  In the meantime, take the doxycycline twice daily for 10 days as prescribed to treat for tick borne disease.  You can use the topical steroid ointment as needed for itching.  If symptoms do not improve significantly, return for recheck with us  or PCP. If symptoms worsen, seek care emergently.

## 2024-03-22 NOTE — ED Triage Notes (Signed)
 Pt states she was bit by a tick on her right side of abdomin a week ago. Now having redness and itching at site and 2 other spots on lower abdomin and lower back.

## 2024-03-25 ENCOUNTER — Other Ambulatory Visit: Payer: Self-pay | Admitting: Family Medicine

## 2024-03-26 LAB — LYME DISEASE SEROLOGY W/REFLEX: Lyme Total Antibody EIA: NEGATIVE

## 2024-03-26 LAB — SPOTTED FEVER GROUP ANTIBODIES
Spotted Fever Group IgG: <1:64 {titer}
Spotted Fever Group IgM: <1:64 {titer}

## 2024-03-26 NOTE — Telephone Encounter (Signed)
 Requested medications are due for refill today.  unsure  Requested medications are on the active medications list.  yes  Last refill. 01/29/2024 8.5 1 rf  Future visit scheduled.   yes  Notes to clinic.  Medication was prescribed for URI - unsure if pt is to continue this medication.    Requested Prescriptions  Pending Prescriptions Disp Refills   albuterol  (VENTOLIN  HFA) 108 (90 Base) MCG/ACT inhaler [Pharmacy Med Name: ALBUTEROL  HFA (PROAIR ) INHALER] 8.5 each 1    Sig: TAKE 2 PUFFS BY MOUTH EVERY 6 HOURS AS NEEDED FOR WHEEZE OR SHORTNESS OF BREATH     Pulmonology:  Beta Agonists 2 Failed - 03/26/2024  9:05 AM      Failed - Last BP in normal range    BP Readings from Last 1 Encounters:  03/22/24 (!) 142/83         Passed - Last Heart Rate in normal range    Pulse Readings from Last 1 Encounters:  03/22/24 88         Passed - Valid encounter within last 12 months    Recent Outpatient Visits           2 months ago Type 2 diabetes mellitus with hyperglycemia, with long-term current use of insulin  [facility])   [facility] Pickard, Cisco Crest, MD   2 months ago Viral URI with cough   [place] [place] Jenelle Mis, FNP   5 months ago Type 2 diabetes mellitus without complication, with long-term current use of insulin  [facility])   [facility] Austine Lefort, MD   6 months ago Leg pain, bilateral   [facility] Jenelle Mis, FNP   11 months ago Controlled type 2 diabetes mellitus with complication, without long-term current use of insulin  [facility])   [facility] Pickard, Cisco Crest, MD

## 2024-04-19 ENCOUNTER — Encounter: Payer: Self-pay | Admitting: Family Medicine

## 2024-04-19 ENCOUNTER — Ambulatory Visit: Payer: Self-pay | Admitting: *Deleted

## 2024-04-19 ENCOUNTER — Ambulatory Visit: Admitting: Family Medicine

## 2024-04-19 VITALS — BP 122/83 | HR 98 | Temp 97.7°F | Ht 65.0 in | Wt 196.4 lb

## 2024-04-19 DIAGNOSIS — M722 Plantar fascial fibromatosis: Secondary | ICD-10-CM

## 2024-04-19 NOTE — Progress Notes (Signed)
 Subjective:  HPI: Amanda Riddle is a 89 y.o. female presenting on 04/19/2024 for Acute Visit (Pain in left ankle. Causing mobility issues pain since Saturday/Also having rt. Shoulder since Saturday, no falls present )   HPI Patient is in today for left heel pain since Saturday. She denies fall, injury, trauma. Has been present since waking Saturday am from sleep. No redness, warmth, or swelling. Does have PMH of gout to her great toe but has not had flare up in many years. Has tried Tylenol  with some relief. Pain is worse the more she walks on it however she is able to bear weight. Does not use a cane or walker.   Review of Systems  All other systems reviewed and are negative.   Relevant past medical history reviewed and updated as indicated.   Past Medical History:  Diagnosis Date   Diabetes mellitus    Diverticulosis    Hypercholesteremia    Hypertension      Past Surgical History:  Procedure Laterality Date   ABDOMINAL HYSTERECTOMY  1986   CATARACT EXTRACTION W/PHACO Right 11/12/2021   Procedure: CATARACT EXTRACTION PHACO AND INTRAOCULAR LENS PLACEMENT (IOC);  Surgeon: Harrie Agent, MD;  Location: [facility];  Service: Ophthalmology;  Laterality: Right;  CDE: 11.79   TONSILLECTOMY  1950    Allergies and medications reviewed and updated.   Current Outpatient Medications:    albuterol  (VENTOLIN  HFA) 108 (90 Base) MCG/ACT inhaler, TAKE 2 PUFFS BY MOUTH EVERY 6 HOURS AS NEEDED FOR WHEEZE OR SHORTNESS OF BREATH, Disp: 8.5 each, Rfl: 1   Alcohol  Swabs  (DROPSAFE ALCOHOL  PREP) 70 % PADS, USE TO CLEAN SKIN PRIOR TO CHECKING BLOOD GLUCOSE, Disp: 100 each, Rfl: 11   amLODipine  (NORVASC ) 5 MG tablet, TAKE 1 TABLET EVERY DAY, Disp: 90 tablet, Rfl: 3   atorvastatin  (LIPITOR) 40 MG tablet, TAKE 1 TABLET EVERY DAY, Disp: 90 tablet, Rfl: 3   clotrimazole -betamethasone  (LOTRISONE ) cream, APPLY 1 APPLICATION TOPICALLY TWICE A DAY, Disp: 30 g, Rfl: 0   Cyanocobalamin (VITAMIN B 12 PO), Take 1  tablet by mouth at bedtime., Disp: , Rfl:    diclofenac  Sodium (VOLTAREN ) 1 % GEL, Apply 4 g topically 4 (four) times daily., Disp: 50 g, Rfl: 1   Flaxseed, Linseed, (FLAXSEED OIL PO), Take 1 capsule by mouth 2 (two) times daily., Disp: , Rfl:    furosemide  (LASIX ) 40 MG tablet, TAKE 1 TABLET EVERY DAY, Disp: 90 tablet, Rfl: 3   glucose blood (TRUE METRIX BLOOD GLUCOSE TEST) test strip, Use as instructed, Disp: 100 each, Rfl: 12   insulin  glargine (LANTUS ) 100 unit/mL SOPN, Inject 10 Units into the skin daily., Disp: 3 mL, Rfl: 11   Insulin  Syringe-Needle U-100 (B-D INS SYR ULTRAFINE .3CC/31G) 31G X 5/16 0.3 ML MISC, Use as directed to inject insulin  SQ 2x daiy. Dx: E11.65, Disp: 300 each, Rfl: 3   Lancets Ultra Thin 30G MISC, TEST BLOOD SUGAR THREE TIMES DAILY AS DIRECTED, Disp: 300 each, Rfl: 3   metFORMIN  (GLUCOPHAGE ) 1000 MG tablet, TAKE 1 TABLET TWICE DAILY, Disp: 180 tablet, Rfl: 1   pioglitazone  (ACTOS ) 30 MG tablet, TAKE 1 TABLET BY MOUTH DAILY, Disp: 100 tablet, Rfl: 2   potassium chloride  (KLOR-CON ) 10 MEQ tablet, TAKE 1 TABLET EVERY DAY, Disp: 90 tablet, Rfl: 3   triamcinolone  ointment (KENALOG ) 0.1 %, Apply 1 Application topically 2 (two) times daily. Apply small amount twice daily as needed for itching.  Do not use for more than 14 days in a  row., Disp: 15 g, Rfl: 0   benzonatate  (TESSALON ) 200 MG capsule, Take 1 capsule (200 mg total) by mouth 3 (three) times daily as needed for cough. (Patient not taking: Reported on 04/19/2024), Disp: 20 capsule, Rfl: 0   benzonatate  (TESSALON ) 200 MG capsule, Take 1 capsule (200 mg total) by mouth 3 (three) times daily. (Patient not taking: Reported on 04/19/2024), Disp: 20 capsule, Rfl: 0   Blood Glucose Calibration (TRUE METRIX LEVEL 1) Low SOLN, USE AS DIRECTED TO CHECK METER (Patient not taking: Reported on 04/19/2024), Disp: 1 each, Rfl: 12   Blood Glucose Monitoring Suppl (TRUE METRIX AIR GLUCOSE METER) w/Device KIT, USE AS DIRECTED (Patient not  taking: Reported on 04/19/2024), Disp: 1 kit, Rfl: 3   Continuous Glucose Receiver (DEXCOM G7 RECEIVER) DEVI, USE AS DIRECTED (Patient not taking: Reported on 04/19/2024), Disp: 1 each, Rfl: 0   Continuous Glucose Sensor (DEXCOM G7 SENSOR) MISC, 1 Device by Does not apply route every 14 (fourteen) days. (Patient not taking: Reported on 04/19/2024), Disp: 2 each, Rfl: 6  Allergies  Allergen Reactions   Codeine Itching   Lisinopril  Swelling    Objective:   BP 122/83   Pulse 98   Temp 97.7 F (36.5 C)   Ht 5' 5 (1.651 m)   Wt 196 lb 6.4 oz (89.1 kg)   SpO2 98%   BMI 32.68 kg/m      04/19/2024   11:53 AM 03/22/2024    5:34 PM 01/08/2024    2:36 PM  Vitals with BMI  Height 5' 5  5' 5  Weight 196 lbs 6 oz  199 lbs 6 oz  BMI 32.68  33.18  Systolic 122 142 859  Diastolic 83 83 76  Pulse 98 88 76     Physical Exam Vitals and nursing note reviewed.  Constitutional:      Appearance: Normal appearance. She is normal weight.  HENT:     Head: Normocephalic and atraumatic.   Cardiovascular:     Pulses:          Dorsalis pedis pulses are 2+ on the left side.       Posterior tibial pulses are 2+ on the left side.   Musculoskeletal:        General: No tenderness or signs of injury.     Right lower leg: No edema.     Left lower leg: No edema.     Left foot: Normal. Normal range of motion and normal capillary refill. No swelling, deformity, tenderness or bony tenderness. Normal pulse.  Feet:     Left foot:     Skin integrity: Skin integrity normal. No erythema or warmth.   Skin:    General: Skin is warm and dry.   Neurological:     General: No focal deficit present.     Mental Status: She is alert and oriented to person, place, and time. Mental status is at baseline.   Psychiatric:        Mood and Affect: Mood normal.        Behavior: Behavior normal.        Thought Content: Thought content normal.        Judgment: Judgment normal.     Assessment & Plan:  Plantar  fasciitis of left foot Assessment & Plan: Symptoms consistent with plantar fasciitis. Encouraged rest, oral or topical NSAIDs, properly fitting shoes with cusion or inserts. Symptoms may improve with stretching and or plantar fasciitis sock. Return to office if symptoms persist  or worsen.       Follow up plan: Return if symptoms worsen or fail to improve.  Jeoffrey GORMAN Barrio, FNP

## 2024-04-19 NOTE — Telephone Encounter (Signed)
 Copied from CRM [id number]. Topic: Clinical - Red Word Triage >> Apr 19, 2024  8:57 AM Amanda Riddle wrote: Red Word that prompted transfer to Nurse Triage: Pain in left ankle, feels like gout is acting up. Hard to walk and this started Saturday morning. Wants a medication to be called in. Reason for Disposition  [1] MODERATE pain (e.g., interferes with normal activities, limping) AND [2] present > 3 days  Answer Assessment - Initial Assessment Questions 1. ONSET: When did the pain start?      Left ankle pain.   When I got up Sat. Morning and started walking it was very painful 2. LOCATION: Where is the pain located?      Left ankle.   It looks normal.   No swelling or briusing or red.  I almost fell because I can't put weight on it. I've had gout in my big toe on my right foot before.   He prescribed something for me for that that knocked the pain out.   I  let her know she would need to be seen. 3. PAIN: How bad is the pain?    (Scale 1-10; or mild, moderate, severe)  - MILD (1-3): doesn't interfere with normal activities.   - MODERATE (4-7): interferes with normal activities (e.g., work or school) or awakens from sleep, limping.   - SEVERE (8-10): excruciating pain, unable to do any normal activities, unable to walk.      Moderate 4. WORK OR EXERCISE: Has there been any recent work or exercise that involved this part of the body?      No 5. CAUSE: What do you think is causing the ankle pain?     I think it's gout. 6. OTHER SYMPTOMS: Do you have any other symptoms? (e.g., calf pain, rash, fever, swelling)     No 7. PREGNANCY: Is there any chance you are pregnant? When was your last menstrual period?     N/A due to age  Protocols used: Ankle Pain-A-AH FYI Only or Action Required?: FYI only for provider.  Patient was last seen in primary care on 01/08/2024 by Duanne Butler DASEN, MD. Called Nurse Triage reporting Ankle Pain Can't bear weight with having severe pain.   History of  gout in other foot.  Thinking it's gout. Per pt. Symptoms began several days ago. Interventions attempted: OTC medications: Tylenol . Symptoms are: rapidly worsening.  Triage Disposition: See PCP When Office is Open (Within 3 Days)  Patient/caregiver understands and will follow disposition?: Yes

## 2024-04-19 NOTE — Assessment & Plan Note (Signed)
 Symptoms consistent with plantar fasciitis. Encouraged rest, oral or topical NSAIDs, properly fitting shoes with cusion or inserts. Symptoms may improve with stretching and or plantar fasciitis sock. Return to office if symptoms persist or worsen.

## 2024-05-13 ENCOUNTER — Encounter: Payer: Self-pay | Admitting: Family Medicine

## 2024-05-13 ENCOUNTER — Ambulatory Visit: Admitting: Family Medicine

## 2024-05-13 VITALS — BP 132/72 | HR 86 | Temp 97.9°F | Ht 65.0 in | Wt 198.8 lb

## 2024-05-13 DIAGNOSIS — Z7984 Long term (current) use of oral hypoglycemic drugs: Secondary | ICD-10-CM

## 2024-05-13 DIAGNOSIS — E782 Mixed hyperlipidemia: Secondary | ICD-10-CM

## 2024-05-13 DIAGNOSIS — Z1231 Encounter for screening mammogram for malignant neoplasm of breast: Secondary | ICD-10-CM

## 2024-05-13 DIAGNOSIS — Z23 Encounter for immunization: Secondary | ICD-10-CM

## 2024-05-13 DIAGNOSIS — I10 Essential (primary) hypertension: Secondary | ICD-10-CM | POA: Diagnosis not present

## 2024-05-13 DIAGNOSIS — E118 Type 2 diabetes mellitus with unspecified complications: Secondary | ICD-10-CM | POA: Diagnosis not present

## 2024-05-13 DIAGNOSIS — Z0001 Encounter for general adult medical examination with abnormal findings: Secondary | ICD-10-CM

## 2024-05-13 DIAGNOSIS — Z0000 Encounter for general adult medical examination without abnormal findings: Secondary | ICD-10-CM

## 2024-05-13 DIAGNOSIS — Z780 Asymptomatic menopausal state: Secondary | ICD-10-CM

## 2024-05-13 NOTE — Progress Notes (Signed)
 Subjective:    Patient ID: Amanda Riddle, female    DOB: 07/20/1935, 89 y.o.   MRN: [id number]  Patient is a very pleasant 89 year old African-American female here today for complete physical exam.  She is due for her mammogram.  She would like us  to schedule this.  She is due for a bone density test.  Due to her age she does not require colonoscopy or a Pap smear.  She denies any falls.  She denies any memory loss.  She denies any depression.  Her blood pressure is well-controlled today.  She states that her blood sugars on 20 units of Lantus  are typically running 90-130 at random times throughout the day.  She denies any hypoglycemia.  She does complain of neuropathy in her feet but she has excellent peripheral pulses.  She is due for the shingles vaccine, RSV vaccine, and a flu shot in the fall. Past Medical History:  Diagnosis Date   Diabetes mellitus    Diverticulosis    Hypercholesteremia    Hypertension    Past Surgical History:  Procedure Laterality Date   ABDOMINAL HYSTERECTOMY  1986   CATARACT EXTRACTION W/PHACO Right 11/12/2021   Procedure: CATARACT EXTRACTION PHACO AND INTRAOCULAR LENS PLACEMENT (IOC);  Surgeon: Harrie Agent, MD;  Location: [facility];  Service: Ophthalmology;  Laterality: Right;  CDE: 11.79   TONSILLECTOMY  1950   Current Outpatient Medications on File Prior to Visit  Medication Sig Dispense Refill   albuterol  (VENTOLIN  HFA) 108 (90 Base) MCG/ACT inhaler TAKE 2 PUFFS BY MOUTH EVERY 6 HOURS AS NEEDED FOR WHEEZE OR SHORTNESS OF BREATH 8.5 each 1   Alcohol  Swabs  (DROPSAFE ALCOHOL  PREP) 70 % PADS USE TO CLEAN SKIN PRIOR TO CHECKING BLOOD GLUCOSE 100 each 11   amLODipine  (NORVASC ) 5 MG tablet TAKE 1 TABLET EVERY DAY 90 tablet 3   atorvastatin  (LIPITOR) 40 MG tablet TAKE 1 TABLET EVERY DAY 90 tablet 3   benzonatate  (TESSALON ) 200 MG capsule Take 1 capsule (200 mg total) by mouth 3 (three) times daily as needed for cough. (Patient not taking: Reported on 04/19/2024) 20  capsule 0   benzonatate  (TESSALON ) 200 MG capsule Take 1 capsule (200 mg total) by mouth 3 (three) times daily. (Patient not taking: Reported on 04/19/2024) 20 capsule 0   Blood Glucose Calibration (TRUE METRIX LEVEL 1) Low SOLN USE AS DIRECTED TO CHECK METER (Patient not taking: Reported on 04/19/2024) 1 each 12   Blood Glucose Monitoring Suppl (TRUE METRIX AIR GLUCOSE METER) w/Device KIT USE AS DIRECTED (Patient not taking: Reported on 04/19/2024) 1 kit 3   clotrimazole -betamethasone  (LOTRISONE ) cream APPLY 1 APPLICATION TOPICALLY TWICE A DAY 30 g 0   Continuous Glucose Receiver (DEXCOM G7 RECEIVER) DEVI USE AS DIRECTED (Patient not taking: Reported on 04/19/2024) 1 each 0   Continuous Glucose Sensor (DEXCOM G7 SENSOR) MISC 1 Device by Does not apply route every 14 (fourteen) days. (Patient not taking: Reported on 04/19/2024) 2 each 6   Cyanocobalamin (VITAMIN B 12 PO) Take 1 tablet by mouth at bedtime.     diclofenac  Sodium (VOLTAREN ) 1 % GEL Apply 4 g topically 4 (four) times daily. 50 g 1   Flaxseed, Linseed, (FLAXSEED OIL PO) Take 1 capsule by mouth 2 (two) times daily.     furosemide  (LASIX ) 40 MG tablet TAKE 1 TABLET EVERY DAY 90 tablet 3   glucose blood (TRUE METRIX BLOOD GLUCOSE TEST) test strip Use as instructed 100 each 12   insulin  glargine (LANTUS ) 100  unit/mL SOPN Inject 10 Units into the skin daily. 3 mL 11   Insulin  Syringe-Needle U-100 (B-D INS SYR ULTRAFINE .3CC/31G) 31G X 5/16 0.3 ML MISC Use as directed to inject insulin  SQ 2x daiy. Dx: E11.65 300 each 3   Lancets Ultra Thin 30G MISC TEST BLOOD SUGAR THREE TIMES DAILY AS DIRECTED 300 each 3   metFORMIN  (GLUCOPHAGE ) 1000 MG tablet TAKE 1 TABLET TWICE DAILY 180 tablet 1   pioglitazone  (ACTOS ) 30 MG tablet TAKE 1 TABLET BY MOUTH DAILY 100 tablet 2   potassium chloride  (KLOR-CON ) 10 MEQ tablet TAKE 1 TABLET EVERY DAY 90 tablet 3   triamcinolone  ointment (KENALOG ) 0.1 % Apply 1 Application topically 2 (two) times daily. Apply small  amount twice daily as needed for itching.  Do not use for more than 14 days in a row. 15 g 0   No current facility-administered medications on file prior to visit.     Allergies  Allergen Reactions   Codeine Itching   Lisinopril  Swelling   Social History   Socioeconomic History   Marital status: Divorced    Spouse name: Not on file   Number of children: 2   Years of education: Not on file   Highest education level: Not on file  Occupational History   Not on file  Tobacco Use   Smoking status: Never   Smokeless tobacco: Current    Types: Snuff  Vaping Use   Vaping status: Never Used  Substance and Sexual Activity   Alcohol  use: No   Drug use: No   Sexual activity: Not Currently  Other Topics Concern   Not on file  Social History Narrative   Not on file   Social Drivers of Health   Financial Resource Strain: Low Risk  (08/14/2023)   Overall Financial Resource Strain (CARDIA)    Difficulty of Paying Living Expenses: Not very hard  Food Insecurity: Food Insecurity Present (08/14/2023)   Hunger Vital Sign    Worried About Running Out of Food in the Last Year: Sometimes true    Ran Out of Food in the Last Year: Sometimes true  Transportation Needs: No Transportation Needs (08/14/2023)   PRAPARE - Administrator, Civil Service (Medical): No    Lack of Transportation (Non-Medical): No  Physical Activity: Inactive (08/14/2023)   Exercise Vital Sign    Days of Exercise per Week: 0 days    Minutes of Exercise per Session: 0 min  Stress: No Stress Concern Present (08/14/2023)   [employer] of Occupational Health - Occupational Stress Questionnaire    Feeling of Stress : Not at all  Social Connections: Moderately Isolated (08/14/2023)   Social Connection and Isolation Panel    Frequency of Communication with Friends and Family: More than three times a week    Frequency of Social Gatherings with Friends and Family: Twice a week    Attends Religious  Services: 1 to 4 times per year    Active Member of [employer] or Organizations: No    Attends Banker Meetings: Never    Marital Status: Divorced  Catering manager Violence: Not At Risk (08/14/2023)   Humiliation, Afraid, Rape, and Kick questionnaire    Fear of Current or Ex-Partner: No    Emotionally Abused: No    Physically Abused: No    Sexually Abused: No     Review of Systems  All other systems reviewed and are negative.      Objective:   Physical Exam Vitals  reviewed.  Constitutional:      Appearance: She is obese.  Cardiovascular:     Rate and Rhythm: Normal rate and regular rhythm.     Heart sounds: Normal heart sounds. No murmur heard.    No friction rub. No gallop.  Pulmonary:     Effort: Pulmonary effort is normal. No respiratory distress.     Breath sounds: Normal breath sounds. No stridor. No wheezing, rhonchi or rales.  Chest:     Chest wall: No tenderness.  Musculoskeletal:     Right lower leg: No edema.     Left lower leg: No edema.  Neurological:     Mental Status: She is alert.           Assessment & Plan:  Controlled type 2 diabetes mellitus with complication, without long-term current use of insulin  (HCC) - Plan: CBC with Differential/Platelet, Comprehensive metabolic panel with GFR, Lipid panel, Hemoglobin A1c, Microalbumin/Creatinine Ratio, Urine  Mixed hyperlipidemia  Hypertension, unspecified type  General medical exam  Encounter for screening mammogram for malignant neoplasm of breast - Plan: MM 3D SCREENING MAMMOGRAM BILATERAL BREAST, CANCELED: MM Digital Screening  Postmenopausal estrogen deficiency - Plan: DG Bone Density Blood pressure today is outstanding.  Blood sugars the patient reports are outstanding.  Check hemoglobin A1c along with urine protein creatinine ratio and fasting lipid panel.  Ideally I would like her A1c to be below 7 and her LDL to be below 100.  Patient received her second shingles vaccine.  I  recommended the RSV vaccine.  Also recommended a flu shot and a COVID shot in the fall.  I will schedule the patient for mammogram as well as a bone density.  She does not require colonoscopy or a Pap smear.

## 2024-05-14 ENCOUNTER — Ambulatory Visit: Payer: Self-pay | Admitting: Family Medicine

## 2024-05-14 LAB — LIPID PANEL
Cholesterol: 185 mg/dL (ref <200)
HDL: 69 mg/dL (ref >=50)
LDL Cholesterol (Calc): 94 mg/dL
Non-HDL Cholesterol (Calc): 116 mg/dL (ref <130)
Total CHOL/HDL Ratio: 2.7 (calc) (ref <5.0)
Triglycerides: 127 mg/dL (ref <150)

## 2024-05-14 LAB — COMPREHENSIVE METABOLIC PANEL WITH GFR
AG Ratio: 1.7 (calc) (ref 1.0–2.5)
ALT: 13 U/L (ref 6–29)
AST: 18 U/L (ref 10–35)
Albumin: 4.3 g/dL (ref 3.6–5.1)
Alkaline phosphatase (APISO): 81 U/L (ref 37–153)
BUN: 10 mg/dL (ref 7–25)
CO2: 23 mmol/L (ref 20–32)
Calcium: 9.4 mg/dL (ref 8.6–10.4)
Chloride: 103 mmol/L (ref 98–110)
Creat: 0.84 mg/dL (ref 0.60–0.95)
Globulin: 2.5 g/dL (ref 1.9–3.7)
Glucose, Bld: 115 mg/dL — ABNORMAL HIGH (ref 65–99)
Potassium: 3.9 mmol/L (ref 3.5–5.3)
Sodium: 142 mmol/L (ref 135–146)
Total Bilirubin: 1.2 mg/dL (ref 0.2–1.2)
Total Protein: 6.8 g/dL (ref 6.1–8.1)
eGFR: 67 mL/min/1.73m2 (ref >=60)

## 2024-05-14 LAB — CBC WITH DIFFERENTIAL/PLATELET
Absolute Lymphocytes: 2780 {cells}/uL (ref 850–3900)
Absolute Monocytes: 470 {cells}/uL (ref 200–950)
Basophils Absolute: 54 {cells}/uL (ref 0–200)
Basophils Relative: 0.7 %
Eosinophils Absolute: 69 {cells}/uL (ref 15–500)
Eosinophils Relative: 0.9 %
HCT: 39.1 % (ref 35.0–45.0)
Hemoglobin: 12.4 g/dL (ref 11.7–15.5)
MCH: 29 pg (ref 27.0–33.0)
MCHC: 31.7 g/dL — ABNORMAL LOW (ref 32.0–36.0)
MCV: 91.6 fL (ref 80.0–100.0)
MPV: 10.1 fL (ref 7.5–12.5)
Monocytes Relative: 6.1 %
Neutro Abs: 4327 {cells}/uL (ref 1500–7800)
Neutrophils Relative %: 56.2 %
Platelets: 278 Thousand/uL (ref 140–400)
RBC: 4.27 Million/uL (ref 3.80–5.10)
RDW: 13.3 % (ref 11.0–15.0)
Total Lymphocyte: 36.1 %
WBC: 7.7 Thousand/uL (ref 3.8–10.8)

## 2024-05-14 LAB — HEMOGLOBIN A1C
Hgb A1c MFr Bld: 8.3 % — ABNORMAL HIGH (ref <5.7)
Mean Plasma Glucose: 192 mg/dL
eAG (mmol/L): 10.6 mmol/L

## 2024-05-14 LAB — MICROALBUMIN / CREATININE URINE RATIO
Creatinine, Urine: 38 mg/dL (ref 20–275)
Microalb, Ur: <0.2 mg/dL

## 2024-05-19 ENCOUNTER — Other Ambulatory Visit: Payer: Self-pay | Admitting: Family Medicine

## 2024-05-19 DIAGNOSIS — E119 Type 2 diabetes mellitus without complications: Secondary | ICD-10-CM

## 2024-05-19 NOTE — Telephone Encounter (Signed)
 Copied from CRM [id number]. Topic: Clinical - Medication Refill >> May 19, 2024  9:49 AM Rosaria E wrote: Medication: insulin  glargine (LANTUS ) 100 unit/mL SOPN  Has the patient contacted their pharmacy? Yes (Agent: If no, request that the patient contact the pharmacy for the refill. If patient does not wish to contact the pharmacy document the reason why and proceed with request.) (Agent: If yes, when and what did the pharmacy advise?)  This is the patient's preferred pharmacy:  [id number] [place], [place] - [address] [facility] [address] [facility] [address] [place] [address] Phone: [phone] Fax: [phone]  Is this the correct pharmacy for this prescription? Yes If no, delete pharmacy and type the correct one.   Has the prescription been filled recently? Yes  Is the patient out of the medication? Yes, down to her last needle  Has the patient been seen for an appointment in the last year OR does the patient have an upcoming appointment? Yes  Can we respond through MyChart? Yes  Agent: Please be advised that Rx refills may take up to 3 business days. We ask that you follow-up with your pharmacy.

## 2024-05-20 ENCOUNTER — Other Ambulatory Visit: Payer: Self-pay

## 2024-05-20 MED ORDER — INSULIN GLARGINE 100 UNITS/ML SOLOSTAR PEN: 20.0000 [IU] | PEN_INJECTOR | Freq: Every day | SUBCUTANEOUS | 3 refills | Status: DC

## 2024-05-20 NOTE — Telephone Encounter (Signed)
 Requested medications are due for refill today.  yes  Requested medications are on the active medications list.  yes  Last refill. 09/04/2023 3mL 11 rf  Future visit scheduled.   no  Notes to clinic.  Per med list pt is taking 20u, not 10 please advise.    Requested Prescriptions  Pending Prescriptions Disp Refills   insulin  glargine (LANTUS ) 100 unit/mL SOPN 3 mL 11    Sig: Inject 10 Units into the skin daily.     Endocrinology:  Diabetes - Insulins Failed - 05/20/2024 10:50 AM      Failed - HBA1C is between 0 and 7.9 and within 180 days    Hgb A1c MFr Bld  Date Value Ref Range Status  05/13/2024 8.3 (H) <5.7 % Final    Comment:    For someone without known diabetes, a hemoglobin A1c value of 6.5% or greater indicates that they may have  diabetes and this should be confirmed with a follow-up  test. . For someone with known diabetes, a value <7% indicates  that their diabetes is well controlled and a value  greater than or equal to 7% indicates suboptimal  control. A1c targets should be individualized based on  duration of diabetes, age, comorbid conditions, and  other considerations. . Currently, no consensus exists regarding use of hemoglobin A1c for diagnosis of diabetes for children. SABRA Amy - Valid encounter within last 6 months    Recent Outpatient Visits           1 week ago Controlled type 2 diabetes mellitus with complication, without long-term current use of insulin  [facility])   [place] [facility] Duanne Butler DASEN, MD   1 month ago Plantar fasciitis of left foot   [facility] Kayla Jeoffrey RAMAN, FNP   4 months ago Type 2 diabetes mellitus with hyperglycemia, with long-term current use of insulin  [facility])   [place] [facility] Duanne Butler DASEN, MD   4 months ago Viral URI with cough   [facility] Kayla Jeoffrey RAMAN, FNP   7 months ago Type 2 diabetes  mellitus without complication, with long-term current use of insulin  [facility])   [place] [facility] Pick[place], Butler DASEN, MD

## 2024-05-20 NOTE — Addendum Note (Signed)
 Addended by: ANGELENA RONAL BRADLEY K on: 05/20/2024 04:24 PM   Modules accepted: Orders

## 2024-05-21 ENCOUNTER — Other Ambulatory Visit: Payer: Self-pay | Admitting: Family Medicine

## 2024-05-21 DIAGNOSIS — E119 Type 2 diabetes mellitus without complications: Secondary | ICD-10-CM

## 2024-05-27 ENCOUNTER — Ambulatory Visit (HOSPITAL_COMMUNITY)
Admission: RE | Admit: 2024-05-27 | Discharge: 2024-05-27 | Disposition: A | Discharge status: Home / Self Care | Source: Ambulatory Visit | Attending: Family Medicine | Admitting: Family Medicine

## 2024-05-27 ENCOUNTER — Encounter (HOSPITAL_COMMUNITY): Payer: Self-pay

## 2024-05-27 DIAGNOSIS — Z780 Asymptomatic menopausal state: Secondary | ICD-10-CM | POA: Insufficient documentation

## 2024-05-27 DIAGNOSIS — Z1231 Encounter for screening mammogram for malignant neoplasm of breast: Secondary | ICD-10-CM | POA: Insufficient documentation

## 2024-05-27 DIAGNOSIS — M85832 Other specified disorders of bone density and structure, left forearm: Secondary | ICD-10-CM | POA: Diagnosis not present

## 2024-05-31 NOTE — Progress Notes (Signed)
 My chart result letter sent. As well as  vm left w/ request to call back w/ any questions

## 2024-06-02 ENCOUNTER — Telehealth: Payer: Self-pay | Admitting: Family Medicine

## 2024-06-02 NOTE — Telephone Encounter (Unsigned)
 Copied from CRM [id number]. Topic: General - Billing Inquiry >> Jun 02, 2024  4:19 PM Tiffini S wrote: Reason for CRM: Patient is getting a recording that states she have a outstanding bill/ balance- she is getting calls twice a week and do not no what the bill is for. Please call the patient at [phone].

## 2024-06-17 ENCOUNTER — Ambulatory Visit: Payer: Self-pay

## 2024-06-17 NOTE — Telephone Encounter (Signed)
 FYI Only or Action Required?: FYI only for provider.  Patient was last seen in primary care on 05/13/2024 by Duanne Butler DASEN, MD.  Called Nurse Triage reporting belly button itching.  Symptoms began today.  Interventions attempted: Nothing.  Symptoms are: unchanged.  Triage Disposition: See PCP When Office is Open (Within 3 Days)  Patient/caregiver understands and will follow disposition?: Yes    Patient stated her belly button itches badly and its blood red. [phone]   Reason for Disposition  [1] MODERATE-SEVERE local itching (i.e., interferes with work, school, activities) AND [2] not improved after 24 hours of hydrocortisone cream  Answer Assessment - Initial Assessment Questions 1. DESCRIPTION: Describe the itching you are having. Where is it located?     Belly button itches and is red 2. SEVERITY: How bad is it?      mild 3. SCRATCHING: Are there any scratch marks? Bleeding?     Area is blood red 4. ONSET: When did the itching begin? (e.g., minutes, hours, days ago)     today 5. CAUSE: What do you think is causing the itching?      no 6. OTHER SYMPTOMS: Do you have any other symptoms? (e.g., fever, rash)     no 7. PREGNANCY: Is there any chance you are pregnant? When was your last menstrual period?     na  Protocols used: Itching - Localized-A-AH

## 2024-06-18 ENCOUNTER — Encounter: Payer: Self-pay | Admitting: Family Medicine

## 2024-06-18 ENCOUNTER — Ambulatory Visit (INDEPENDENT_AMBULATORY_CARE_PROVIDER_SITE_OTHER): Admitting: Family Medicine

## 2024-06-18 VITALS — BP 134/70 | HR 81 | Temp 97.9°F | Ht 65.0 in | Wt 199.8 lb

## 2024-06-18 DIAGNOSIS — R198 Other specified symptoms and signs involving the digestive system and abdomen: Secondary | ICD-10-CM | POA: Diagnosis not present

## 2024-06-18 MED ORDER — CLOTRIMAZOLE 1 % EX CREA: 1.0000 | TOPICAL_CREAM | Freq: Two times a day (BID) | CUTANEOUS | 0 refills | Status: AC

## 2024-06-18 NOTE — Progress Notes (Signed)
 Subjective:    Patient ID: Amanda Riddle, female    DOB: 1934/11/12, 89 y.o.   MRN: [id number] Patient is a very sweet 89 year old African-American female who presents today with itching and burning in her umbilicus x 24 hours.  On exam, the skin inside her umbilicus is erythematous and irritated.  There is a sharp serpiginous well-demarcated border.  The patient appears to have a fungal infection due to moisture. Past Medical History:  Diagnosis Date   Diabetes mellitus    Diverticulosis    Hypercholesteremia    Hypertension    Past Surgical History:  Procedure Laterality Date   ABDOMINAL HYSTERECTOMY  1986   CATARACT EXTRACTION W/PHACO Right 11/12/2021   Procedure: CATARACT EXTRACTION PHACO AND INTRAOCULAR LENS PLACEMENT (IOC);  Surgeon: Harrie Agent, MD;  Location: [facility];  Service: Ophthalmology;  Laterality: Right;  CDE: 11.79   TONSILLECTOMY  1950   Current Outpatient Medications on File Prior to Visit  Medication Sig Dispense Refill   albuterol  (VENTOLIN  HFA) 108 (90 Base) MCG/ACT inhaler TAKE 2 PUFFS BY MOUTH EVERY 6 HOURS AS NEEDED FOR WHEEZE OR SHORTNESS OF BREATH 8.5 each 1   Alcohol  Swabs  (DROPSAFE ALCOHOL  PREP) 70 % PADS USE TO CLEAN SKIN PRIOR TO CHECKING BLOOD GLUCOSE 100 each 11   amLODipine  (NORVASC ) 5 MG tablet TAKE 1 TABLET EVERY DAY 90 tablet 3   atorvastatin  (LIPITOR) 40 MG tablet TAKE 1 TABLET EVERY DAY 90 tablet 3   Blood Glucose Calibration (TRUE METRIX LEVEL 1) Low SOLN USE AS DIRECTED TO CHECK METER 1 each 12   Blood Glucose Monitoring Suppl (TRUE METRIX AIR GLUCOSE METER) w/Device KIT USE AS DIRECTED 1 kit 3   Cyanocobalamin (VITAMIN B 12 PO) Take 1 tablet by mouth at bedtime.     diclofenac  Sodium (VOLTAREN ) 1 % GEL Apply 4 g topically 4 (four) times daily. 50 g 1   Flaxseed, Linseed, (FLAXSEED OIL PO) Take 1 capsule by mouth 2 (two) times daily.     furosemide  (LASIX ) 40 MG tablet TAKE 1 TABLET EVERY DAY 90 tablet 3   glucose blood (TRUE METRIX BLOOD  GLUCOSE TEST) test strip TEST BLOOD SUGAR AS DIRECTED 300 strip 3   insulin  glargine (LANTUS ) 100 unit/mL SOPN Inject 20 Units into the skin daily. 15 mL 3   Insulin  Syringe-Needle U-100 (B-D INS SYR ULTRAFINE .3CC/31G) 31G X 5/16 0.3 ML MISC Use as directed to inject insulin  SQ 2x daiy. Dx: E11.65 300 each 3   Lancets Ultra Thin 30G MISC TEST BLOOD SUGAR THREE TIMES DAILY AS DIRECTED 300 each 3   metFORMIN  (GLUCOPHAGE ) 1000 MG tablet TAKE 1 TABLET TWICE DAILY 180 tablet 1   pioglitazone  (ACTOS ) 30 MG tablet TAKE 1 TABLET BY MOUTH DAILY 100 tablet 2   potassium chloride  (KLOR-CON ) 10 MEQ tablet TAKE 1 TABLET EVERY DAY 90 tablet 3   triamcinolone  ointment (KENALOG ) 0.1 % Apply 1 Application topically 2 (two) times daily. Apply small amount twice daily as needed for itching.  Do not use for more than 14 days in a row. 15 g 0   benzonatate  (TESSALON ) 200 MG capsule Take 1 capsule (200 mg total) by mouth 3 (three) times daily as needed for cough. (Patient not taking: Reported on 05/13/2024) 20 capsule 0   benzonatate  (TESSALON ) 200 MG capsule Take 1 capsule (200 mg total) by mouth 3 (three) times daily. (Patient not taking: Reported on 05/13/2024) 20 capsule 0   No current facility-administered medications on file prior to visit.  Allergies  Allergen Reactions   Codeine Itching   Lisinopril  Swelling   Social History   Socioeconomic History   Marital status: Divorced    Spouse name: Not on file   Number of children: 2   Years of education: Not on file   Highest education level: Not on file  Occupational History   Not on file  Tobacco Use   Smoking status: Never   Smokeless tobacco: Current    Types: Snuff  Vaping Use   Vaping status: Never Used  Substance and Sexual Activity   Alcohol  use: No   Drug use: No   Sexual activity: Not Currently  Other Topics Concern   Not on file  Social History Narrative   Not on file   Social Drivers of Health   Financial Resource Strain: Low  Risk  (08/14/2023)   Overall Financial Resource Strain (CARDIA)    Difficulty of Paying Living Expenses: Not very hard  Food Insecurity: Food Insecurity Present (08/14/2023)   Hunger Vital Sign    Worried About Running Out of Food in the Last Year: Sometimes true    Ran Out of Food in the Last Year: Sometimes true  Transportation Needs: No Transportation Needs (08/14/2023)   PRAPARE - Administrator, Civil Service (Medical): No    Lack of Transportation (Non-Medical): No  Physical Activity: Inactive (08/14/2023)   Exercise Vital Sign    Days of Exercise per Week: 0 days    Minutes of Exercise per Session: 0 min  Stress: No Stress Concern Present (08/14/2023)   [employer] of Occupational Health - Occupational Stress Questionnaire    Feeling of Stress : Not at all  Social Connections: Moderately Isolated (08/14/2023)   Social Connection and Isolation Panel    Frequency of Communication with Friends and Family: More than three times a week    Frequency of Social Gatherings with Friends and Family: Twice a week    Attends Religious Services: 1 to 4 times per year    Active Member of [employer] or Organizations: No    Attends Banker Meetings: Never    Marital Status: Divorced  Catering manager Violence: Not At Risk (08/14/2023)   Humiliation, Afraid, Rape, and Kick questionnaire    Fear of Current or Ex-Partner: No    Emotionally Abused: No    Physically Abused: No    Sexually Abused: No     Review of Systems  All other systems reviewed and are negative.      Objective:   Physical Exam Vitals reviewed.  Constitutional:      Appearance: She is obese.  Cardiovascular:     Rate and Rhythm: Normal rate and regular rhythm.     Heart sounds: Normal heart sounds. No murmur heard.    No friction rub. No gallop.  Pulmonary:     Effort: Pulmonary effort is normal. No respiratory distress.     Breath sounds: Normal breath sounds. No stridor. No wheezing,  rhonchi or rales.  Chest:     Chest wall: No tenderness.  Abdominal:   Musculoskeletal:     Right lower leg: No edema.     Left lower leg: No edema.  Neurological:     Mental Status: She is alert.           Assessment & Plan:  Umbilicus discharge Based on the appearance, I believe the patient has a yeast infection or fungal infection in her umbilicus.  Recommended using Lotrimin  cream twice daily  for 10 days to treat the infection.  Less likely would be cellulitis.  If not improving over the weekend, she should recheck with me next week and we would switch to oral antibiotics.

## 2024-08-06 ENCOUNTER — Other Ambulatory Visit: Payer: Self-pay | Admitting: Family Medicine

## 2024-08-06 NOTE — Telephone Encounter (Unsigned)
 Copied from CRM [id number]. Topic: Clinical - Medication Refill >> Aug 06, 2024 10:32 AM Antwanette L wrote: Medication: Insulin  Syringe-Needle U-100 (B-D INS SYR ULTRAFINE .3CC/31G) 31G X 5/16 0.3 ML MISC  Has the patient contacted their pharmacy? Yes. Pharmacy said the pt couldn't get a refill til November 2025  This is the patient's preferred pharmacy:  [id number] [place], [place] - [address] [facility] [address] [address] [place] [address] Phone: [phone] Fax: [phone]   Is this the correct pharmacy for this prescription? Yes    Has the prescription been filled recently? No. Last refill was on 01/26/21  Is the patient out of the medication? Yes  Has the patient been seen for an appointment in the last year OR does the patient have an upcoming appointment? Yes. Last ov w/ Dr. Duanne was on 06/18/24  Can we respond through MyChart? No. Contact the pt by phone at [phone]  Agent: Please be advised that Rx refills may take up to 3 business days. We ask that you follow-up with your pharmacy.

## 2024-08-09 MED ORDER — INSULIN SYRINGE-NEEDLE U-100 31G X 5/16" 0.3 ML MISC: 3 refills | Status: AC

## 2024-08-09 NOTE — Telephone Encounter (Signed)
 Requested medication (s) are due for refill today -expired Rx  Requested medication (s) are on the active medication list -yes  Future visit scheduled -yes  Last refill: 01/26/21 #300 3RF  Notes to clinic: expired Rx, no protocol listed  Requested Prescriptions  Pending Prescriptions Disp Refills   Insulin  Syringe-Needle U-100 (B-D INS SYR ULTRAFINE .3CC/31G) 31G X 5/16 0.3 ML MISC 300 each 3    Sig: Use as directed to inject insulin  SQ 2x daiy. Dx: E11.65     There is no refill protocol information for this order       Requested Prescriptions  Pending Prescriptions Disp Refills   Insulin  Syringe-Needle U-100 (B-D INS SYR ULTRAFINE .3CC/31G) 31G X 5/16 0.3 ML MISC 300 each 3    Sig: Use as directed to inject insulin  SQ 2x daiy. Dx: E11.65     There is no refill protocol information for this order

## 2024-08-19 ENCOUNTER — Ambulatory Visit: Payer: Medicare HMO | Admitting: *Deleted

## 2024-08-19 VITALS — Ht 65.5 in | Wt 199.0 lb

## 2024-08-19 DIAGNOSIS — Z0000 Encounter for general adult medical examination without abnormal findings: Secondary | ICD-10-CM

## 2024-08-19 NOTE — Progress Notes (Signed)
 Subjective:   Amanda Riddle is a 89 y.o. female who presents for Medicare Annual (Subsequent) preventive examination.  Visit Complete: Virtual I connected with  Amanda Riddle on 08/19/24 by a audio enabled telemedicine application and verified that I am speaking with the correct person using two identifiers.  Patient Location: Home  Provider Location: Home Office  I discussed the limitations of evaluation and management by telemedicine. The patient expressed understanding and agreed to proceed.  Vital Signs: Because this visit was a virtual/telehealth visit, some criteria may be missing or patient reported. Any vitals not documented were not able to be obtained and vitals that have been documented are patient reported.  Cardiac Risk Factors include: advanced age (>27men, >21 women);hypertension;diabetes mellitus;family history of premature cardiovascular disease;obesity (BMI >30kg/m2)     Objective:    Today's Vitals   08/19/24 1527  Weight: 199 lb (90.3 kg)  Height: 5' 5.5 (1.664 m)   Body mass index is 32.61 kg/m.     08/19/2024    3:22 PM 01/02/2024   10:27 AM 08/14/2023    5:46 PM 12/14/2022    4:31 PM 03/22/2022    9:08 AM 11/07/2021    3:33 PM 03/09/2021   10:05 AM  Advanced Directives  Does Patient Have a Medical Advance Directive? No No No No No No No  Would patient like information on creating a medical advance directive? No - Patient declined No - Patient declined Yes (MAU/Ambulatory/Procedural Areas - Information given) No - Patient declined No - Patient declined No - Patient declined No - Patient declined    Current Medications (verified) Outpatient Encounter Medications as of 08/19/2024  Medication Sig   albuterol  (VENTOLIN  HFA) 108 (90 Base) MCG/ACT inhaler TAKE 2 PUFFS BY MOUTH EVERY 6 HOURS AS NEEDED FOR WHEEZE OR SHORTNESS OF BREATH   Alcohol  Swabs  (DROPSAFE ALCOHOL  PREP) 70 % PADS USE TO CLEAN SKIN PRIOR TO CHECKING BLOOD GLUCOSE   amLODipine  (NORVASC ) 5  MG tablet TAKE 1 TABLET EVERY DAY   atorvastatin  (LIPITOR) 40 MG tablet TAKE 1 TABLET EVERY DAY   Blood Glucose Calibration (TRUE METRIX LEVEL 1) Low SOLN USE AS DIRECTED TO CHECK METER   Blood Glucose Monitoring Suppl (TRUE METRIX AIR GLUCOSE METER) w/Device KIT USE AS DIRECTED   clotrimazole  (LOTRIMIN ) 1 % cream Apply 1 Application topically 2 (two) times daily.   Cyanocobalamin (VITAMIN B 12 PO) Take 1 tablet by mouth at bedtime.   diclofenac  Sodium (VOLTAREN ) 1 % GEL Apply 4 g topically 4 (four) times daily.   Flaxseed, Linseed, (FLAXSEED OIL PO) Take 1 capsule by mouth 2 (two) times daily.   furosemide  (LASIX ) 40 MG tablet TAKE 1 TABLET EVERY DAY   glucose blood (TRUE METRIX BLOOD GLUCOSE TEST) test strip TEST BLOOD SUGAR AS DIRECTED   insulin  glargine (LANTUS ) 100 unit/mL SOPN Inject 20 Units into the skin daily.   Insulin  Syringe-Needle U-100 (B-D INS SYR ULTRAFINE .3CC/31G) 31G X 5/16 0.3 ML MISC Use as directed to inject insulin  SQ 2x daiy. Dx: E11.65   Lancets Ultra Thin 30G MISC TEST BLOOD SUGAR THREE TIMES DAILY AS DIRECTED   metFORMIN  (GLUCOPHAGE ) 1000 MG tablet TAKE 1 TABLET TWICE DAILY   pioglitazone  (ACTOS ) 30 MG tablet TAKE 1 TABLET BY MOUTH DAILY   potassium chloride  (KLOR-CON ) 10 MEQ tablet TAKE 1 TABLET EVERY DAY   triamcinolone  ointment (KENALOG ) 0.1 % Apply 1 Application topically 2 (two) times daily. Apply small amount twice daily as needed for itching.  Do  not use for more than 14 days in a row.   benzonatate  (TESSALON ) 200 MG capsule Take 1 capsule (200 mg total) by mouth 3 (three) times daily as needed for cough. (Patient not taking: Reported on 08/19/2024)   benzonatate  (TESSALON ) 200 MG capsule Take 1 capsule (200 mg total) by mouth 3 (three) times daily. (Patient not taking: Reported on 08/19/2024)   No facility-administered encounter medications on file as of 08/19/2024.    Allergies (verified) Codeine and Lisinopril    History: Past Medical History:   Diagnosis Date   Diabetes mellitus    Diverticulosis    Hypercholesteremia    Hypertension    Past Surgical History:  Procedure Laterality Date   ABDOMINAL HYSTERECTOMY  1986   CATARACT EXTRACTION W/PHACO Right 11/12/2021   Procedure: CATARACT EXTRACTION PHACO AND INTRAOCULAR LENS PLACEMENT (IOC);  Surgeon: Harrie Agent, MD;  Location: [facility];  Service: Ophthalmology;  Laterality: Right;  CDE: 11.79   TONSILLECTOMY  1950   Family History  Problem Relation Age of Onset   Colon cancer Neg Hx    Breast cancer Neg Hx    Social History   Socioeconomic History   Marital status: Divorced    Spouse name: Not on file   Number of children: 2   Years of education: Not on file   Highest education level: Not on file  Occupational History   Not on file  Tobacco Use   Smoking status: Never   Smokeless tobacco: Current    Types: Snuff  Vaping Use   Vaping status: Never Used  Substance and Sexual Activity   Alcohol  use: No   Drug use: No   Sexual activity: Not Currently  Other Topics Concern   Not on file  Social History Narrative   Not on file   Social Drivers of Health   Financial Resource Strain: Low Risk  (08/19/2024)   Overall Financial Resource Strain (CARDIA)    Difficulty of Paying Living Expenses: Not hard at all  Food Insecurity: No Food Insecurity (08/19/2024)   Hunger Vital Sign    Worried About Running Out of Food in the Last Year: Never true    Ran Out of Food in the Last Year: Never true  Transportation Needs: No Transportation Needs (08/19/2024)   PRAPARE - Administrator, Civil Service (Medical): No    Lack of Transportation (Non-Medical): No  Physical Activity: Inactive (08/19/2024)   Exercise Vital Sign    Days of Exercise per Week: 0 days    Minutes of Exercise per Session: 0 min  Stress: No Stress Concern Present (08/19/2024)   [employer] of Occupational Health - Occupational Stress Questionnaire    Feeling of Stress: Not at all   Social Connections: Moderately Isolated (08/14/2023)   Social Connection and Isolation Panel    Frequency of Communication with Friends and Family: More than three times a week    Frequency of Social Gatherings with Friends and Family: Twice a week    Attends Religious Services: 1 to 4 times per year    Active Member of [employer] or Organizations: No    Attends Engineer, Structural: Never    Marital Status: Divorced    Tobacco Counseling Ready to quit: Not Answered Counseling given: Not Answered   Clinical Intake:  Pre-visit preparation completed: Yes  Pain : No/denies pain     Diabetes: Yes CBG done?: No Did pt. bring in CBG monitor from home?: No  How often do you need to  have someone help you when you read instructions, pamphlets, or other written materials from your doctor or pharmacy?: 1 - Never  Interpreter Needed?: No  Information entered by :: Mliss Graff LPN   Activities of Daily Living    08/19/2024    3:26 PM  In your present state of health, do you have any difficulty performing the following activities:  Hearing? 0  Vision? 0  Difficulty concentrating or making decisions? 0  Walking or climbing stairs? 0  Dressing or bathing? 0  Doing errands, shopping? 0  Preparing Food and eating ? N  Using the Toilet? N  In the past six months, have you accidently leaked urine? N  Do you have problems with loss of bowel control? N  Managing your Medications? N  Managing your Finances? N  Housekeeping or managing your Housekeeping? N    Patient Care Team: Duanne Butler DASEN, MD as PCP - General (Family Medicine) Nicholaus Sherlean CROME, [facility] (Inactive) as Pharmacist (Pharmacist)  Indicate any recent Medical Services you may have received from other than [facility] providers in the past year (date may be approximate).     Assessment:   This is a routine wellness examination for Tayja.  Hearing/Vision screen Hearing Screening - Comments:: No trouble  hearing Vision Screening - Comments:: [facility] Up to date   Goals Addressed             This Visit's Progress    Increase physical activity         Depression Screen    08/19/2024    3:23 PM 05/13/2024   10:48 AM 10/07/2023   10:26 AM 08/14/2023    5:43 PM 01/02/2023   10:09 AM 10/03/2022   10:18 AM 07/04/2022    8:55 AM  PHQ 2/9 Scores  PHQ - 2 Score 2 0 0 0 0 0 0  PHQ- 9 Score 5          Fall Risk    08/19/2024    3:22 PM 05/13/2024   10:48 AM 10/07/2023   10:26 AM 08/14/2023    2:41 PM 01/02/2023   10:09 AM  Fall Risk   Falls in the past year? 1 1 0 0 1  Number falls in past yr: 0 0 0 0 0  Injury with Fall? 0 1 0 0 1  Risk for fall due to : Impaired balance/gait History of fall(s);Impaired balance/gait;Impaired mobility No Fall Risks No Fall Risks History of fall(s);Impaired balance/gait;Orthopedic patient  Follow up Falls evaluation completed;Education provided;Falls prevention discussed Falls evaluation completed;Education provided Falls prevention discussed Falls prevention discussed;Education provided;Falls evaluation completed Education provided;Falls prevention discussed    MEDICARE RISK AT HOME: Medicare Risk at Home Any stairs in or around the home?: Yes If so, are there any without handrails?: No Home free of loose throw rugs in walkways, pet beds, electrical cords, etc?: Yes Adequate lighting in your home to reduce risk of falls?: Yes Life alert?: No Use of a cane, walker or w/c?: No Grab bars in the bathroom?: No Shower chair or bench in shower?: No Elevated toilet seat or a handicapped toilet?: No  TIMED UP AND GO:  Was the test performed?  No    Cognitive Function:        08/19/2024    3:26 PM 08/14/2023    5:46 PM 03/22/2022    9:14 AM 03/09/2021   10:22 AM  6CIT Screen  What Year? 0 points 0 points 0 points 0 points  What month? 0 points  0 points 0 points 0 points  What time? 0 points 0 points 0 points 0 points  Count back from 20 0  points 0 points 0 points 0 points  Months in reverse 2 points 2 points 2 points 0 points  Repeat phrase 4 points 0 points 0 points 2 points  Total Score 6 points 2 points 2 points 2 points    Immunizations Immunization History  Administered Date(s) Administered   Fluad Quad(high Dose 65+) 07/23/2019, 08/17/2020, 09/25/2021   Fluad Trivalent(High Dose 65+) 09/09/2023   INFLUENZA, HIGH DOSE SEASONAL PF 08/15/2017, 08/14/2018   Influenza,inj,Quad PF,6+ Mos 07/05/2013, 07/05/2014, 08/10/2015, 08/26/2016, 07/04/2022   Influenza-Unspecified 07/19/2022   PFIZER(Purple Top)SARS-COV-2 Vaccination 04/14/2020, 05/05/2020   Pfizer Covid-19 Vaccine Bivalent Booster 32yrs & up 10/15/2021   Pfizer(Comirnaty)Fall Seasonal Vaccine 12 years and older 07/05/2022   Pneumococcal Conjugate-13 11/02/2013   Pneumococcal Polysaccharide-23 05/09/2015   Zoster Recombinant(Shingrix ) 02/05/2022, 05/13/2024    TDAP status: Due, Education has been provided regarding the importance of this vaccine. Advised may receive this vaccine at local pharmacy or Health Dept. Aware to provide a copy of the vaccination record if obtained from local pharmacy or Health Dept. Verbalized acceptance and understanding.  Flu Vaccine status: Due, Education has been provided regarding the importance of this vaccine. Advised may receive this vaccine at local pharmacy or Health Dept. Aware to provide a copy of the vaccination record if obtained from local pharmacy or Health Dept. Verbalized acceptance and understanding.  Pneumococcal vaccine status: Up to date  Covid-19 vaccine status: Declined, Education has been provided regarding the importance of this vaccine but patient still declined. Advised may receive this vaccine at local pharmacy or Health Dept.or vaccine clinic. Aware to provide a copy of the vaccination record if obtained from local pharmacy or Health Dept. Verbalized acceptance and understanding.  Qualifies for Shingles  Vaccine? No   Zostavax completed Yes   Shingrix  Completed?: Yes  Screening Tests Health Maintenance  Topic Date Due   OPHTHALMOLOGY EXAM  07/03/2023   FOOT EXAM  04/03/2024   Influenza Vaccine  05/21/2024   COVID-19 Vaccine (5 - 2025-26 season) 06/21/2024   HEMOGLOBIN A1C  11/13/2024   Medicare Annual Wellness (AWV)  08/19/2025   Pneumococcal Vaccine: 50+ Years  Completed   DEXA SCAN  Completed   Zoster Vaccines- Shingrix   Completed   Meningococcal B Vaccine  Aged Out   DTaP/Tdap/Td  Discontinued   Colonoscopy  Discontinued    Health Maintenance  Health Maintenance Due  Topic Date Due   OPHTHALMOLOGY EXAM  07/03/2023   FOOT EXAM  04/03/2024   Influenza Vaccine  05/21/2024   COVID-19 Vaccine (5 - 2025-26 season) 06/21/2024    Colorectal cancer screening: No longer required.   Mammogram status: No longer required due to  .  Bone Density status: Completed 2025. Results reflect: Bone density results: OSTEOPENIA. Repeat every 2 years.  Lung Cancer Screening: (Low Dose CT Chest recommended if Age 53-80 years, 20 pack-year currently smoking OR have quit w/in 15years.) does not qualify.   Lung Cancer Screening Referral:   Additional Screening:  Hepatitis C Screening: does not qualify;  Vision Screening: Recommended annual ophthalmology exams for early detection of glaucoma and other disorders of the eye. Is the patient up to date with their annual eye exam?  Yes  Who is the provider or what is the name of the office in which the patient attends annual eye exams? [facility] If pt is not established with a provider, would they  like to be referred to a provider to establish care? No .   Dental Screening: Recommended annual dental exams for proper oral hygiene  Nutrition Risk Assessment:  Has the patient had any N/V/D within the last 2 months?  No  Does the patient have any non-healing wounds?  No  Has the patient had any unintentional weight loss or weight gain?  no  Diabetes:  Is the patient diabetic?  Yes  If diabetic, was a CBG obtained today?  No  Did the patient bring in their glucometer from home?  No  How often do you monitor your CBG's? 1 x a week.   Financial Strains and Diabetes Management:  Are you having any financial strains with the device, your supplies or your medication? No .  Does the patient want to be seen by Chronic Care Management for management of their diabetes?  No  Would the patient like to be referred to a Nutritionist or for Diabetic Management?  No   Diabetic Exams:  Diabetic Eye Exam: .  Pt has been advised about the importance in completing this exam. A referral has been placed today.  Diabetic Foot Exam: . Pt has been advised about the importance in completing this exam. .    Community Resource Referral / Chronic Care Management: CRR required this visit?  No   CCM required this visit?  No     Plan:     I have personally reviewed and noted the following in the patient's chart:   Medical and social history Use of alcohol , tobacco or illicit drugs  Current medications and supplements including opioid prescriptions. Patient is not currently taking opioid prescriptions. Functional ability and status Nutritional status Physical activity Advanced directives List of other physicians Hospitalizations, surgeries, and ER visits in previous 12 months Vitals Screenings to include cognitive, depression, and falls Referrals and appointments  In addition, I have reviewed and discussed with patient certain preventive protocols, quality metrics, and best practice recommendations. A written personalized care plan for preventive services as well as general preventive health recommendations were provided to patient.     Mliss Graff, LPN   [id number]   After Visit Summary: (MyChart) Due to this being a telephonic visit, the after visit summary with patients personalized plan was offered to patient via MyChart    Nurse Notes:

## 2024-08-19 NOTE — Patient Instructions (Signed)
 Amanda Riddle , Thank you for taking time to come for your Medicare Wellness Visit. I appreciate your ongoing commitment to your health goals. Please review the following plan we discussed and let me know if I can assist you in the future.   Screening recommendations/referrals: Colonoscopy:  Mammogram:  Bone Density:  Recommended yearly ophthalmology/optometry visit for glaucoma screening and checkup Recommended yearly dental visit for hygiene and checkup  Vaccinations: Influenza vaccine:  Pneumococcal vaccine:  Tdap vaccine:  Shingles vaccine:        Preventive Care 87 Years and Older, Female Preventive care refers to lifestyle choices and visits with your health care provider that can promote health and wellness. What does preventive care include? A yearly physical exam. This is also called an annual well check. Dental exams once or twice a year. Routine eye exams. Ask your health care provider how often you should have your eyes checked. Personal lifestyle choices, including: Daily care of your teeth and gums. Regular physical activity. Eating a healthy diet. Avoiding tobacco and drug use. Limiting alcohol  use. Practicing safe sex. Taking low-dose aspirin  every day. Taking vitamin and mineral supplements as recommended by your health care provider. What happens during an annual well check? The services and screenings done by your health care provider during your annual well check will depend on your age, overall health, lifestyle risk factors, and family history of disease. Counseling  Your health care provider may ask you questions about your: Alcohol  use. Tobacco use. Drug use. Emotional well-being. Home and relationship well-being. Sexual activity. Eating habits. History of falls. Memory and ability to understand (cognition). Work and work astronomer. Reproductive health. Screening  You may have the following tests or measurements: Height, weight, and  BMI. Blood pressure. Lipid and cholesterol levels. These may be checked every 5 years, or more frequently if you are over 67 years old. Skin check. Lung cancer screening. You may have this screening every year starting at age 87 if you have a 30-pack-year history of smoking and currently smoke or have quit within the past 15 years. Fecal occult blood test (FOBT) of the stool. You may have this test every year starting at age 87. Flexible sigmoidoscopy or colonoscopy. You may have a sigmoidoscopy every 5 years or a colonoscopy every 10 years starting at age 87. Hepatitis C blood test. Hepatitis B blood test. Sexually transmitted disease (STD) testing. Diabetes screening. This is done by checking your blood sugar (glucose) after you have not eaten for a while (fasting). You may have this done every 1-3 years. Bone density scan. This is done to screen for osteoporosis. You may have this done starting at age 87. Mammogram. This may be done every 87-2 years. Talk to your health care provider about how often you should have regular mammograms. Talk with your health care provider about your test results, treatment options, and if necessary, the need for more tests. Vaccines  Your health care provider may recommend certain vaccines, such as: Influenza vaccine. This is recommended every year. Tetanus, diphtheria, and acellular pertussis (Tdap, Td) vaccine. You may need a Td booster every 10 years. Zoster vaccine. You may need this after age 87. Pneumococcal 13-valent conjugate (PCV13) vaccine. One dose is recommended after age 87. Pneumococcal polysaccharide (PPSV23) vaccine. One dose is recommended after age 87. Talk to your health care provider about which screenings and vaccines you need and how often you need them. This information is not intended to replace advice given to you by your health  care provider. Make sure you discuss any questions you have with your health care provider. Document  Released: 11/03/2015 Document Revised: 06/26/2016 Document Reviewed: 08/08/2015 Elsevier Interactive Patient Education  2017 [employer].  Fall Prevention in the Home Falls can cause injuries. They can happen to people of all ages. There are many things you can do to make your home safe and to help prevent falls. What can I do on the outside of my home? Regularly fix the edges of walkways and driveways and fix any cracks. Remove anything that might make you trip as you walk through a door, such as a raised step or threshold. Trim any bushes or trees on the path to your home. Use bright outdoor lighting. Clear any walking paths of anything that might make someone trip, such as rocks or tools. Regularly check to see if handrails are loose or broken. Make sure that both sides of any steps have handrails. Any raised decks and porches should have guardrails on the edges. Have any leaves, snow, or ice cleared regularly. Use sand or salt on walking paths during winter. Clean up any spills in your garage right away. This includes oil or grease spills. What can I do in the bathroom? Use night lights. Install grab bars by the toilet and in the tub and shower. Do not use towel bars as grab bars. Use non-skid mats or decals in the tub or shower. If you need to sit down in the shower, use a plastic, non-slip stool. Keep the floor dry. Clean up any water  that spills on the floor as soon as it happens. Remove soap buildup in the tub or shower regularly. Attach bath mats securely with double-sided non-slip rug tape. Do not have throw rugs and other things on the floor that can make you trip. What can I do in the bedroom? Use night lights. Make sure that you have a light by your bed that is easy to reach. Do not use any sheets or blankets that are too big for your bed. They should not hang down onto the floor. Have a firm chair that has side arms. You can use this for support while you get dressed. Do  not have throw rugs and other things on the floor that can make you trip. What can I do in the kitchen? Clean up any spills right away. Avoid walking on wet floors. Keep items that you use a lot in easy-to-reach places. If you need to reach something above you, use a strong step stool that has a grab bar. Keep electrical cords out of the way. Do not use floor polish or wax that makes floors slippery. If you must use wax, use non-skid floor wax. Do not have throw rugs and other things on the floor that can make you trip. What can I do with my stairs? Do not leave any items on the stairs. Make sure that there are handrails on both sides of the stairs and use them. Fix handrails that are broken or loose. Make sure that handrails are as long as the stairways. Check any carpeting to make sure that it is firmly attached to the stairs. Fix any carpet that is loose or worn. Avoid having throw rugs at the top or bottom of the stairs. If you do have throw rugs, attach them to the floor with carpet tape. Make sure that you have a light switch at the top of the stairs and the bottom of the stairs. If you do not  have them, ask someone to add them for you. What else can I do to help prevent falls? Wear shoes that: Do not have high heels. Have rubber bottoms. Are comfortable and fit you well. Are closed at the toe. Do not wear sandals. If you use a stepladder: Make sure that it is fully opened. Do not climb a closed stepladder. Make sure that both sides of the stepladder are locked into place. Ask someone to hold it for you, if possible. Clearly mark and make sure that you can see: Any grab bars or handrails. First and last steps. Where the edge of each step is. Use tools that help you move around (mobility aids) if they are needed. These include: Canes. Walkers. Scooters. Crutches. Turn on the lights when you go into a dark area. Replace any light bulbs as soon as they burn out. Set up your  furniture so you have a clear path. Avoid moving your furniture around. If any of your floors are uneven, fix them. If there are any pets around you, be aware of where they are. Review your medicines with your doctor. Some medicines can make you feel dizzy. This can increase your chance of falling. Ask your doctor what other things that you can do to help prevent falls. This information is not intended to replace advice given to you by your health care provider. Make sure you discuss any questions you have with your health care provider. Document Released: 08/03/2009 Document Revised: 03/14/2016 Document Reviewed: 11/11/2014 Elsevier Interactive Patient Education  2017 [employer].

## 2024-09-06 ENCOUNTER — Encounter: Payer: Self-pay | Admitting: Family Medicine

## 2024-09-06 ENCOUNTER — Ambulatory Visit: Admitting: Family Medicine

## 2024-09-06 VITALS — BP 134/67 | HR 69 | Temp 98.3°F | Ht 65.5 in | Wt 185.8 lb

## 2024-09-06 DIAGNOSIS — J069 Acute upper respiratory infection, unspecified: Secondary | ICD-10-CM

## 2024-09-06 MED ORDER — BENZONATATE 200 MG PO CAPS: 200.0000 mg | ORAL_CAPSULE | Freq: Three times a day (TID) | ORAL | 0 refills | Status: AC | PRN

## 2024-09-06 MED ORDER — ALBUTEROL SULFATE HFA 108 (90 BASE) MCG/ACT IN AERS: 1.0000 | INHALATION_SPRAY | RESPIRATORY_TRACT | 1 refills | Status: AC | PRN

## 2024-09-06 NOTE — Progress Notes (Signed)
 Acute Office Visit  Patient ID: Amanda Riddle, female    DOB: 12-07-1934, 89 y.o.   MRN: [id number]  PCP: Duanne Butler DASEN, MD  Chief Complaint  Patient presents with   Acute Visit    Cough and headache on the left side of head since Friday      Subjective:     HPI  Discussed the use of AI scribe software for clinical note transcription with the patient, who gave verbal consent to proceed.  History of Present Illness Amanda Riddle is an 89 year old female who presents with cough and headache since Friday.  She has been experiencing a dry, persistent cough for four days, which disrupts her sleep at night. She uses albuterol  during severe coughing episodes, which provides relief. No chest pain, shortness of breath or wheezing, except during these episodes. No fever, chills, or body aches.  The headache was initially severe, but has since resolved. She has been taking Tylenol , which she believes helps alleviate her symptoms.  She reports a runny nose with watery discharge. No sore throat, ear pain, or sinus pain. Her grandson has also been coughing.   Review of Systems  All other systems reviewed and are negative.   Past Medical History:  Diagnosis Date   Diabetes mellitus    Diverticulosis    Hypercholesteremia    Hypertension     Past Surgical History:  Procedure Laterality Date   ABDOMINAL HYSTERECTOMY  1986   CATARACT EXTRACTION W/PHACO Right 11/12/2021   Procedure: CATARACT EXTRACTION PHACO AND INTRAOCULAR LENS PLACEMENT (IOC);  Surgeon: Harrie Agent, MD;  Location: [facility];  Service: Ophthalmology;  Laterality: Right;  CDE: 11.79   TONSILLECTOMY  1950    Current Outpatient Medications on File Prior to Visit  Medication Sig Dispense Refill   Alcohol  Swabs  (DROPSAFE ALCOHOL  PREP) 70 % PADS USE TO CLEAN SKIN PRIOR TO CHECKING BLOOD GLUCOSE 100 each 11   amLODipine  (NORVASC ) 5 MG tablet TAKE 1 TABLET EVERY DAY 90 tablet 3   atorvastatin  (LIPITOR) 40 MG tablet  TAKE 1 TABLET EVERY DAY 90 tablet 3   Blood Glucose Calibration (TRUE METRIX LEVEL 1) Low SOLN USE AS DIRECTED TO CHECK METER 1 each 12   Blood Glucose Monitoring Suppl (TRUE METRIX AIR GLUCOSE METER) w/Device KIT USE AS DIRECTED 1 kit 3   Calcium  Carb-Cholecalciferol (CALCIUM  + VITAMIN D3 PO) Take 1 tablet by mouth daily.     clotrimazole  (LOTRIMIN ) 1 % cream Apply 1 Application topically 2 (two) times daily. 30 g 0   Cyanocobalamin (VITAMIN B 12 PO) Take 1 tablet by mouth at bedtime.     diclofenac  Sodium (VOLTAREN ) 1 % GEL Apply 4 g topically 4 (four) times daily. 50 g 1   Flaxseed, Linseed, (FLAXSEED OIL PO) Take 1 capsule by mouth 2 (two) times daily.     furosemide  (LASIX ) 40 MG tablet TAKE 1 TABLET EVERY DAY 90 tablet 3   glucose blood (TRUE METRIX BLOOD GLUCOSE TEST) test strip TEST BLOOD SUGAR AS DIRECTED 300 strip 3   hydrochlorothiazide  (HYDRODIURIL ) 25 MG tablet Take 25 mg by mouth daily.     insulin  glargine (LANTUS ) 100 unit/mL SOPN Inject 20 Units into the skin daily. 15 mL 3   Insulin  Syringe-Needle U-100 (B-D INS SYR ULTRAFINE .3CC/31G) 31G X 5/16 0.3 ML MISC Use as directed to inject insulin  SQ 2x daiy. Dx: E11.65 300 each 3   Lancets Ultra Thin 30G MISC TEST BLOOD SUGAR THREE TIMES DAILY AS  DIRECTED 300 each 3   metFORMIN  (GLUCOPHAGE ) 1000 MG tablet TAKE 1 TABLET TWICE DAILY 180 tablet 1   pioglitazone  (ACTOS ) 30 MG tablet TAKE 1 TABLET BY MOUTH DAILY 100 tablet 2   potassium chloride  (KLOR-CON ) 10 MEQ tablet TAKE 1 TABLET EVERY DAY 90 tablet 3   triamcinolone  ointment (KENALOG ) 0.1 % Apply 1 Application topically 2 (two) times daily. Apply small amount twice daily as needed for itching.  Do not use for more than 14 days in a row. 15 g 0   nirmatrelvir /ritonavir  (PAXLOVID , 300/100,) 20 x 150 MG & 10 x 100MG  TBPK Take 3 tablets by mouth 2 (two) times daily. (Patient not taking: Reported on 09/06/2024)     No current facility-administered medications on file prior to visit.     Allergies  Allergen Reactions   Codeine Itching   Lisinopril  Swelling       Objective:    BP 134/67   Pulse 69   Temp 98.3 F (36.8 C)   Ht 5' 5.5 (1.664 m)   Wt 185 lb 12.8 oz (84.3 kg)   SpO2 98%   BMI 30.45 kg/m    Physical Exam Vitals and nursing note reviewed.  Constitutional:      Appearance: Normal appearance. She is normal weight.  HENT:     Head: Normocephalic and atraumatic.     Right Ear: Tympanic membrane, ear canal and external ear normal.     Left Ear: Tympanic membrane, ear canal and external ear normal.     Nose: Nose normal.     Right Sinus: No maxillary sinus tenderness or frontal sinus tenderness.     Left Sinus: No maxillary sinus tenderness or frontal sinus tenderness.     Mouth/Throat:     Mouth: Mucous membranes are moist.     Pharynx: Oropharynx is clear.  Eyes:     Extraocular Movements: Extraocular movements intact.     Conjunctiva/sclera: Conjunctivae normal.  Cardiovascular:     Rate and Rhythm: Normal rate and regular rhythm.     Pulses: Normal pulses.     Heart sounds: Normal heart sounds.  Pulmonary:     Effort: Pulmonary effort is normal.     Breath sounds: Normal breath sounds.  Musculoskeletal:     Cervical back: No tenderness.  Lymphadenopathy:     Cervical: No cervical adenopathy.  Skin:    General: Skin is warm and dry.  Neurological:     General: No focal deficit present.     Mental Status: She is alert and oriented to person, place, and time. Mental status is at baseline.  Psychiatric:        Mood and Affect: Mood normal.        Behavior: Behavior normal.        Thought Content: Thought content normal.        Judgment: Judgment normal.       No results found for any visits on 09/06/24.     Assessment & Plan:   Problem List Items Addressed This Visit     Viral URI with cough - Primary   Relevant Medications   nirmatrelvir /ritonavir  (PAXLOVID , 300/100,) 20 x 150 MG & 10 x 100MG  TBPK   Other Relevant  Orders   SARS-CoV-2 RNA, Influenza A/B, and RSV RNA, Qualitative NAAT    Assessment and Plan Assessment & Plan Acute upper respiratory infection Likely viral etiology. No bacterial infection signs. Differential includes viral infection, COVID-19, influenza, or RSV. - Continue albuterol  as needed for  cough relief. - Prescribed Tessalon  Perles for cough management. - Ordered COVID-19, influenza, and RSV swab tests. - Advised symptomatic management with OTC medications if Tessalon  Perles are too expensive. - Follow up if symptoms worsen or persist    Meds ordered this encounter  Medications   benzonatate  (TESSALON ) 200 MG capsule    Sig: Take 1 capsule (200 mg total) by mouth 3 (three) times daily as needed for cough.    Dispense:  20 capsule    Refill:  0    Supervising Provider:   DUANNE LOWERS T [[id number]]   albuterol  (VENTOLIN  HFA) 108 (90 Base) MCG/ACT inhaler    Sig: Inhale 1-2 puffs into the lungs every 4 (four) hours as needed for wheezing or shortness of breath.    Dispense:  18 each    Refill:  1    Supervising Provider:   DUANNE LOWERS T [[id number]]    Return if symptoms worsen or fail to improve.  Jeoffrey GORMAN Barrio, FNP [facility]

## 2024-09-08 LAB — SARS-COV-2 RNA, INFLUENZA A/B, AND RSV RNA, QUALITATIVE NAAT
INFLUENZA A RNA: NOT DETECTED
INFLUENZA B RNA: NOT DETECTED
RSV RNA: NOT DETECTED
SARS COV2 RNA: NOT DETECTED

## 2024-09-09 ENCOUNTER — Ambulatory Visit: Payer: Self-pay | Admitting: Family Medicine

## 2024-09-10 ENCOUNTER — Ambulatory Visit: Payer: Self-pay

## 2024-09-10 NOTE — Telephone Encounter (Signed)
 Called to gain more information, no answer. Was not able to lvm.

## 2024-09-10 NOTE — Telephone Encounter (Signed)
 FYI Only or Action Required?: FYI only for provider: Denies OV.  Patient was last seen in primary care on 09/06/2024 by Kayla Jeoffrey RAMAN, FNP.  Called Nurse Triage reporting Chest Pain.  Symptoms began a week ago.  Interventions attempted: Prescription medications: Tessalon  Perles.  Symptoms are: unchanged.  Triage Disposition: See PCP When Office is Open (Within 3 Days)  Patient/caregiver understands and will follow disposition?: No, refuses disposition Reason for Disposition  [1] Chest pain(s) lasting a few seconds from coughing AND [2] persists > 3 days  Answer Assessment - Initial Assessment Questions Patient denies SOB, chest pain radiating.  experiencing congestion and having a hard time coughing something up. No available appointments at PCP office, tried to schedule at a different office for this afternoon, patient stated does not have the $55 to pay for a visit, advised patient she should be seen today. Denies scheduling. Advised UC.   1. LOCATION: Where does it hurt?       Middle of chest  2. RADIATION: Does the pain go anywhere else? (e.g., into neck, jaw, arms, back)     Denies  3. ONSET: When did the chest pain begin? (Minutes, hours or days)      Last Friday  4. PATTERN: Does the pain come and go, or has it been constant since it started?  Does it get worse with exertion?      Comes and goes  5. DURATION: How long does it last (e.g., seconds, minutes, hours)     A few seconds  6. SEVERITY: How bad is the pain?  (e.g., Scale 1-10; mild, moderate, or severe)     Moderate, states feels like bricks are sitting on chest  7. CARDIAC RISK FACTORS: Do you have any history of heart problems or risk factors for heart disease? (e.g., angina, prior heart attack; diabetes, high blood pressure, high cholesterol, smoker, or strong family history of heart disease)     Denies  8. PULMONARY RISK FACTORS: Do you have any history of lung disease?  (e.g., blood  clots in lung, asthma, emphysema, birth control pills)     Asthma  9. CAUSE: What do you think is causing the chest pain?     Believes its coming from the cough, and happens at night  10. OTHER SYMPTOMS: Do you have any other symptoms? (e.g., dizziness, nausea, vomiting, sweating, fever, difficulty breathing, cough)       Cough, dx with URI 11/17, dizziness at times.  Protocols used: Chest Pain-A-AH  Copied from CRM [id number]. Topic: Clinical - Red Word Triage >> Sep 10, 2024 11:45 AM Pinkey ORN wrote: Red Word that prompted transfer to Nurse Triage: Heaviness + Tightness In Chest >> Sep 10, 2024 11:45 AM Pinkey ORN wrote: In patient's words, she described the feeling as a brick sitting on her chest.

## 2024-09-21 DIAGNOSIS — E119 Type 2 diabetes mellitus without complications: Secondary | ICD-10-CM | POA: Diagnosis not present

## 2024-09-24 ENCOUNTER — Telehealth: Payer: Self-pay

## 2024-09-24 NOTE — Telephone Encounter (Signed)
 Patient was identified as falling into the True [place] Measure - Diabetes.   Patient was: Requires a call back at a later time. No answer and no vm. Patient will need a lab/ nurse appt for A1c by end of year.

## 2024-10-04 ENCOUNTER — Telehealth: Payer: Self-pay

## 2024-10-04 NOTE — Telephone Encounter (Signed)
 Patient was identified as falling into the True North Measure - Diabetes.   Patient was: Requires a call back at a later time.No answer and no voicemail.

## 2024-10-06 ENCOUNTER — Telehealth: Payer: Self-pay

## 2024-10-06 NOTE — Telephone Encounter (Signed)
 Patient was identified as falling into the True North Measure - Diabetes.   Patient was: Requires a call back at a later time.No answer and no voicemail available.

## 2024-10-20 ENCOUNTER — Other Ambulatory Visit: Payer: Self-pay | Admitting: Family Medicine

## 2024-10-20 DIAGNOSIS — E119 Type 2 diabetes mellitus without complications: Secondary | ICD-10-CM

## 2024-10-26 ENCOUNTER — Other Ambulatory Visit: Payer: Self-pay | Admitting: Family Medicine

## 2024-10-26 DIAGNOSIS — E118 Type 2 diabetes mellitus with unspecified complications: Secondary | ICD-10-CM

## 2024-10-26 NOTE — Telephone Encounter (Signed)
 Copied from CRM [id number]. Topic: Clinical - Medication Refill >> Oct 26, 2024 11:27 AM Kendralyn S wrote: Medication: metFORMIN  (GLUCOPHAGE ) 1000 MG tablet  Has the patient contacted their pharmacy? Yes (Agent: If no, request that the patient contact the pharmacy for the refill. If patient does not wish to contact the pharmacy document the reason why and proceed with request.) (Agent: If yes, when and what did the pharmacy advise?)  This is the patient's preferred pharmacy:  [id number] [place], [place] - [address] [place] [address] Phone: [phone] Fax: [phone]  [facility] - [place], [place] - [address] [place] [address] Phone: [phone] Fax: [phone]  Is this the correct pharmacy for this prescription? Yes If no, delete pharmacy and type the correct one.   Has the prescription been filled recently? no  Is the patient out of the medication? No  Has the patient been seen for an appointment in the last year OR does the patient have an upcoming appointment? Yes  Can we respond through MyChart? Yes  Agent: Please be advised that Rx refills may take up to 3 business days. We ask that you follow-up with your pharmacy.

## 2024-10-28 MED ORDER — METFORMIN HCL 1000 MG PO TABS: 1000.0000 mg | ORAL_TABLET | Freq: Two times a day (BID) | ORAL | 1 refills | Status: AC

## 2024-10-28 NOTE — Telephone Encounter (Signed)
 Requested medications are due for refill today.  yes  Requested medications are on the active medications list.  yes  Last refill. 01/27/2024 #180 1 rf  Future visit scheduled.   Yes in November  Notes to clinic.  Labs are expired.    Requested Prescriptions  Pending Prescriptions Disp Refills   metFORMIN  (GLUCOPHAGE ) 1000 MG tablet 180 tablet 1    Sig: Take 1 tablet (1,000 mg total) by mouth 2 (two) times daily.     Endocrinology:  Diabetes - Biguanides Failed - 10/28/2024  9:38 AM      Failed - HBA1C is between 0 and 7.9 and within 180 days    Hgb A1c MFr Bld  Date Value Ref Range Status  05/13/2024 8.3 (H) <5.7 % Final    Comment:    For someone without known diabetes, a hemoglobin A1c value of 6.5% or greater indicates that they may have  diabetes and this should be confirmed with a follow-up  test. . For someone with known diabetes, a value <7% indicates  that their diabetes is well controlled and a value  greater than or equal to 7% indicates suboptimal  control. A1c targets should be individualized based on  duration of diabetes, age, comorbid conditions, and  other considerations. . Currently, no consensus exists regarding use of hemoglobin A1c for diagnosis of diabetes for children. .          Failed - B12 Level in normal range and within 720 days    No results found for: VITAMINB12       Passed - Cr in normal range and within 360 days    Creat  Date Value Ref Range Status  05/13/2024 0.84 0.60 - 0.95 mg/dL Final   Creatinine, Urine  Date Value Ref Range Status  05/13/2024 38 20 - 275 mg/dL Final         Passed - eGFR in normal range and within 360 days    GFR, Est African American  Date Value Ref Range Status  02/08/2021 58 (L) > OR = 60 mL/min/1.82m2 Final   GFR, Est Non African American  Date Value Ref Range Status  02/08/2021 50 (L) > OR = 60 mL/min/1.6m2 Final   GFR, Estimated  Date Value Ref Range Status  01/02/2024 >60 >60 mL/min Final     Comment:    (NOTE) Calculated using the CKD-EPI Creatinine Equation (2021)    eGFR  Date Value Ref Range Status  05/13/2024 67 > OR = 60 mL/min/1.52m2 Final         Passed - Valid encounter within last 6 months    Recent Outpatient Visits           1 month ago Viral URI with cough   [place] [facility] Kayla Jeoffrey RAMAN, FNP   4 months ago Umbilicus discharge   [place] [facility] Duanne Butler DASEN, MD   5 months ago Controlled type 2 diabetes mellitus with complication, without long-term current use of insulin  [facility])   [facility] Duanne Butler DASEN, MD   6 months ago Plantar fasciitis of left foot   [place] [facility] Kayla Jeoffrey RAMAN, FNP   9 months ago Type 2 diabetes mellitus with hyperglycemia, with long-term current use of insulin  [facility])   [facility] Pickard, Butler DASEN, MD              Passed - CBC within  normal limits and completed in the last 12 months    WBC  Date Value Ref Range Status  05/13/2024 7.7 3.8 - 10.8 Thousand/uL Final   RBC  Date Value Ref Range Status  05/13/2024 4.27 3.80 - 5.10 Million/uL Final   Hemoglobin  Date Value Ref Range Status  05/13/2024 12.4 11.7 - 15.5 g/dL Final   HCT  Date Value Ref Range Status  05/13/2024 39.1 35.0 - 45.0 % Final   MCHC  Date Value Ref Range Status  05/13/2024 31.7 (L) 32.0 - 36.0 g/dL Final    Comment:    For adults, a slight decrease in the calculated MCHC value (in the range of 30 to 32 g/dL) is most likely not clinically significant; however, it should be interpreted with caution in correlation with other red cell parameters and the patient's clinical condition.    [facility]  Date Value Ref Range Status  05/13/2024 29.0 27.0 - 33.0 pg Final   MCV  Date Value Ref Range Status  05/13/2024 91.6 80.0 - 100.0 fL Final   No results found for: PLTCOUNTKUC, LABPLAT, POCPLA RDW   Date Value Ref Range Status  05/13/2024 13.3 11.0 - 15.0 % Final

## 2024-11-18 ENCOUNTER — Other Ambulatory Visit: Payer: Self-pay | Admitting: Family Medicine

## 2024-11-18 DIAGNOSIS — I10 Essential (primary) hypertension: Secondary | ICD-10-CM

## 2025-08-25 ENCOUNTER — Ambulatory Visit
